# Patient Record
Sex: Female | Born: 1945 | Race: White | Hispanic: No | Smoking: Current every day smoker
Health system: Southern US, Community
[De-identification: ages and names within clinical notes are randomized; demographics above are authoritative.]

## PROBLEM LIST (undated history)

## (undated) DIAGNOSIS — G43909 Migraine, unspecified, not intractable, without status migrainosus: Secondary | ICD-10-CM

## (undated) DIAGNOSIS — R413 Other amnesia: Secondary | ICD-10-CM

## (undated) DIAGNOSIS — M5030 Other cervical disc degeneration, unspecified cervical region: Secondary | ICD-10-CM

## (undated) DIAGNOSIS — F323 Major depressive disorder, single episode, severe with psychotic features: Secondary | ICD-10-CM

## (undated) DIAGNOSIS — M5412 Radiculopathy, cervical region: Secondary | ICD-10-CM

## (undated) DIAGNOSIS — D649 Anemia, unspecified: Secondary | ICD-10-CM

## (undated) DIAGNOSIS — K219 Gastro-esophageal reflux disease without esophagitis: Secondary | ICD-10-CM

## (undated) DIAGNOSIS — M545 Low back pain, unspecified: Secondary | ICD-10-CM

## (undated) DIAGNOSIS — F3289 Other specified depressive episodes: Secondary | ICD-10-CM

## (undated) DIAGNOSIS — M5020 Other cervical disc displacement, unspecified cervical region: Secondary | ICD-10-CM

## (undated) DIAGNOSIS — T8859XA Other complications of anesthesia, initial encounter: Secondary | ICD-10-CM

## (undated) DIAGNOSIS — R634 Abnormal weight loss: Secondary | ICD-10-CM

## (undated) DIAGNOSIS — I10 Essential (primary) hypertension: Secondary | ICD-10-CM

## (undated) DIAGNOSIS — F411 Generalized anxiety disorder: Secondary | ICD-10-CM

## (undated) DIAGNOSIS — R002 Palpitations: Secondary | ICD-10-CM

## (undated) DIAGNOSIS — R42 Dizziness and giddiness: Secondary | ICD-10-CM

## (undated) DIAGNOSIS — E785 Hyperlipidemia, unspecified: Secondary | ICD-10-CM

## (undated) DIAGNOSIS — G4700 Insomnia, unspecified: Secondary | ICD-10-CM

## (undated) DIAGNOSIS — Z8719 Personal history of other diseases of the digestive system: Secondary | ICD-10-CM

## (undated) DIAGNOSIS — F32A Depression, unspecified: Secondary | ICD-10-CM

## (undated) DIAGNOSIS — F329 Major depressive disorder, single episode, unspecified: Secondary | ICD-10-CM

## (undated) DIAGNOSIS — F1021 Alcohol dependence, in remission: Secondary | ICD-10-CM

## (undated) HISTORY — DX: Essential (primary) hypertension: I10

## (undated) HISTORY — DX: Other specified depressive episodes: F32.89

## (undated) HISTORY — DX: Low back pain: M54.5

## (undated) HISTORY — PX: CERVICAL FUSION: SHX112

## (undated) HISTORY — DX: Personal history of other diseases of the digestive system: Z87.19

## (undated) HISTORY — DX: Palpitations: R00.2

## (undated) HISTORY — DX: Generalized anxiety disorder: F41.1

## (undated) HISTORY — DX: Anemia, unspecified: D64.9

## (undated) HISTORY — PX: DILATION AND CURETTAGE OF UTERUS: SHX78

## (undated) HISTORY — DX: Other cervical disc degeneration, unspecified cervical region: M50.30

## (undated) HISTORY — PX: TONSILLECTOMY: SUR1361

## (undated) HISTORY — PX: LUMBAR FUSION: SHX111

## (undated) HISTORY — DX: Alcohol dependence, in remission: F10.21

## (undated) HISTORY — DX: Abnormal weight loss: R63.4

## (undated) HISTORY — DX: Radiculopathy, cervical region: M54.12

## (undated) HISTORY — DX: Major depressive disorder, single episode, unspecified: F32.9

## (undated) HISTORY — DX: Low back pain, unspecified: M54.50

## (undated) HISTORY — DX: Insomnia, unspecified: G47.00

## (undated) HISTORY — DX: Major depressive disorder, single episode, severe with psychotic features: F32.3

## (undated) HISTORY — DX: Depression, unspecified: F32.A

## (undated) HISTORY — DX: Dizziness and giddiness: R42

## (undated) HISTORY — DX: Other cervical disc displacement, unspecified cervical region: M50.20

## (undated) HISTORY — PX: EYE SURGERY: SHX253

## (undated) HISTORY — DX: Other amnesia: R41.3

## (undated) HISTORY — DX: Hyperlipidemia, unspecified: E78.5

## (undated) HISTORY — DX: Migraine, unspecified, not intractable, without status migrainosus: G43.909

## (undated) HISTORY — DX: Gastro-esophageal reflux disease without esophagitis: K21.9

---

## 1997-08-16 ENCOUNTER — Other Ambulatory Visit: Admission: RE | Admit: 1997-08-16 | Discharge: 1997-08-16 | Payer: Self-pay | Admitting: Internal Medicine

## 1998-05-05 ENCOUNTER — Ambulatory Visit (HOSPITAL_COMMUNITY): Admission: RE | Admit: 1998-05-05 | Discharge: 1998-05-05 | Payer: Self-pay | Admitting: Internal Medicine

## 1998-05-05 ENCOUNTER — Encounter: Payer: Self-pay | Admitting: Internal Medicine

## 1998-12-13 ENCOUNTER — Other Ambulatory Visit: Admission: RE | Admit: 1998-12-13 | Discharge: 1998-12-13 | Payer: Self-pay | Admitting: *Deleted

## 1998-12-13 ENCOUNTER — Encounter (INDEPENDENT_AMBULATORY_CARE_PROVIDER_SITE_OTHER): Payer: Self-pay | Admitting: Specialist

## 2000-10-05 ENCOUNTER — Encounter: Payer: Self-pay | Admitting: Internal Medicine

## 2000-10-05 ENCOUNTER — Ambulatory Visit (HOSPITAL_COMMUNITY): Admission: RE | Admit: 2000-10-05 | Discharge: 2000-10-05 | Payer: Self-pay | Admitting: Internal Medicine

## 2001-05-08 ENCOUNTER — Encounter: Payer: Self-pay | Admitting: Internal Medicine

## 2001-05-08 ENCOUNTER — Ambulatory Visit (HOSPITAL_COMMUNITY): Admission: RE | Admit: 2001-05-08 | Discharge: 2001-05-08 | Payer: Self-pay | Admitting: Internal Medicine

## 2001-05-20 ENCOUNTER — Other Ambulatory Visit: Admission: RE | Admit: 2001-05-20 | Discharge: 2001-05-20 | Payer: Self-pay | Admitting: *Deleted

## 2001-07-16 ENCOUNTER — Encounter: Admission: RE | Admit: 2001-07-16 | Discharge: 2001-07-16 | Payer: Self-pay | Admitting: Orthopedic Surgery

## 2001-07-16 ENCOUNTER — Encounter: Payer: Self-pay | Admitting: Orthopedic Surgery

## 2001-08-20 ENCOUNTER — Encounter: Admission: RE | Admit: 2001-08-20 | Discharge: 2001-08-20 | Payer: Self-pay | Admitting: Orthopaedic Surgery

## 2001-08-20 ENCOUNTER — Encounter: Payer: Self-pay | Admitting: Orthopaedic Surgery

## 2001-09-18 ENCOUNTER — Encounter: Payer: Self-pay | Admitting: Orthopedic Surgery

## 2001-09-24 ENCOUNTER — Ambulatory Visit: Admission: RE | Admit: 2001-09-24 | Discharge: 2001-09-24 | Payer: Self-pay | Admitting: Orthopaedic Surgery

## 2001-10-07 ENCOUNTER — Inpatient Hospital Stay (HOSPITAL_COMMUNITY): Admission: RE | Admit: 2001-10-07 | Discharge: 2001-10-13 | Payer: Self-pay | Admitting: Orthopaedic Surgery

## 2001-10-07 ENCOUNTER — Encounter: Payer: Self-pay | Admitting: Orthopaedic Surgery

## 2001-10-12 ENCOUNTER — Encounter: Payer: Self-pay | Admitting: Orthopaedic Surgery

## 2002-09-24 ENCOUNTER — Inpatient Hospital Stay (HOSPITAL_COMMUNITY): Admission: EM | Admit: 2002-09-24 | Discharge: 2002-09-29 | Payer: Self-pay | Admitting: Emergency Medicine

## 2002-09-24 ENCOUNTER — Encounter: Payer: Self-pay | Admitting: Emergency Medicine

## 2002-09-25 ENCOUNTER — Encounter: Payer: Self-pay | Admitting: Internal Medicine

## 2004-01-10 ENCOUNTER — Ambulatory Visit: Payer: Self-pay | Admitting: Internal Medicine

## 2004-01-27 ENCOUNTER — Ambulatory Visit: Payer: Self-pay | Admitting: Internal Medicine

## 2004-03-07 ENCOUNTER — Ambulatory Visit: Payer: Self-pay | Admitting: Internal Medicine

## 2004-07-12 ENCOUNTER — Ambulatory Visit (HOSPITAL_COMMUNITY): Admission: RE | Admit: 2004-07-12 | Discharge: 2004-07-12 | Payer: Self-pay | Admitting: Internal Medicine

## 2004-07-12 ENCOUNTER — Ambulatory Visit: Payer: Self-pay | Admitting: Internal Medicine

## 2004-09-06 ENCOUNTER — Ambulatory Visit: Payer: Self-pay | Admitting: Internal Medicine

## 2004-09-13 ENCOUNTER — Ambulatory Visit: Payer: Self-pay | Admitting: Internal Medicine

## 2004-11-29 ENCOUNTER — Ambulatory Visit (HOSPITAL_COMMUNITY): Admission: RE | Admit: 2004-11-29 | Discharge: 2004-11-30 | Payer: Self-pay | Admitting: Orthopaedic Surgery

## 2005-01-09 ENCOUNTER — Ambulatory Visit: Payer: Self-pay | Admitting: Internal Medicine

## 2005-01-16 ENCOUNTER — Ambulatory Visit: Payer: Self-pay | Admitting: Internal Medicine

## 2005-03-08 ENCOUNTER — Ambulatory Visit: Payer: Self-pay | Admitting: Internal Medicine

## 2005-04-24 ENCOUNTER — Ambulatory Visit: Payer: Self-pay | Admitting: Internal Medicine

## 2005-05-08 ENCOUNTER — Ambulatory Visit: Payer: Self-pay | Admitting: Internal Medicine

## 2005-05-08 ENCOUNTER — Encounter: Admission: RE | Admit: 2005-05-08 | Discharge: 2005-05-08 | Payer: Self-pay | Admitting: Internal Medicine

## 2005-05-10 ENCOUNTER — Ambulatory Visit (HOSPITAL_COMMUNITY): Admission: RE | Admit: 2005-05-10 | Discharge: 2005-05-10 | Payer: Self-pay | Admitting: Internal Medicine

## 2005-05-19 ENCOUNTER — Encounter: Admission: RE | Admit: 2005-05-19 | Discharge: 2005-05-19 | Payer: Self-pay | Admitting: Orthopaedic Surgery

## 2005-06-15 ENCOUNTER — Ambulatory Visit: Payer: Self-pay | Admitting: Internal Medicine

## 2005-06-15 ENCOUNTER — Inpatient Hospital Stay (HOSPITAL_COMMUNITY): Admission: EM | Admit: 2005-06-15 | Discharge: 2005-06-19 | Payer: Self-pay | Admitting: Emergency Medicine

## 2005-06-19 ENCOUNTER — Ambulatory Visit: Payer: Self-pay | Admitting: Internal Medicine

## 2005-07-18 ENCOUNTER — Ambulatory Visit: Payer: Self-pay | Admitting: Internal Medicine

## 2005-11-15 ENCOUNTER — Encounter: Admission: RE | Admit: 2005-11-15 | Discharge: 2005-11-15 | Payer: Self-pay | Admitting: Orthopaedic Surgery

## 2005-11-15 ENCOUNTER — Encounter: Payer: Self-pay | Admitting: Internal Medicine

## 2006-02-26 HISTORY — PX: BACK SURGERY: SHX140

## 2006-03-25 ENCOUNTER — Encounter: Admission: RE | Admit: 2006-03-25 | Discharge: 2006-03-25 | Payer: Self-pay | Admitting: Orthopaedic Surgery

## 2006-03-25 ENCOUNTER — Encounter: Payer: Self-pay | Admitting: Internal Medicine

## 2006-07-09 ENCOUNTER — Ambulatory Visit: Payer: Self-pay | Admitting: Internal Medicine

## 2006-09-19 DIAGNOSIS — F411 Generalized anxiety disorder: Secondary | ICD-10-CM | POA: Insufficient documentation

## 2006-09-19 DIAGNOSIS — K219 Gastro-esophageal reflux disease without esophagitis: Secondary | ICD-10-CM | POA: Insufficient documentation

## 2006-10-22 ENCOUNTER — Ambulatory Visit: Payer: Self-pay | Admitting: Internal Medicine

## 2006-10-25 DIAGNOSIS — G4700 Insomnia, unspecified: Secondary | ICD-10-CM

## 2006-10-25 DIAGNOSIS — Z8719 Personal history of other diseases of the digestive system: Secondary | ICD-10-CM

## 2006-10-25 DIAGNOSIS — E785 Hyperlipidemia, unspecified: Secondary | ICD-10-CM | POA: Insufficient documentation

## 2006-10-25 DIAGNOSIS — F1021 Alcohol dependence, in remission: Secondary | ICD-10-CM

## 2006-10-25 DIAGNOSIS — G43909 Migraine, unspecified, not intractable, without status migrainosus: Secondary | ICD-10-CM | POA: Insufficient documentation

## 2006-11-23 ENCOUNTER — Inpatient Hospital Stay (HOSPITAL_COMMUNITY): Admission: EM | Admit: 2006-11-23 | Discharge: 2006-11-28 | Payer: Self-pay | Admitting: Emergency Medicine

## 2006-11-26 ENCOUNTER — Ambulatory Visit: Payer: Self-pay | Admitting: Internal Medicine

## 2007-01-14 ENCOUNTER — Ambulatory Visit: Payer: Self-pay | Admitting: Internal Medicine

## 2007-01-14 DIAGNOSIS — D649 Anemia, unspecified: Secondary | ICD-10-CM | POA: Insufficient documentation

## 2007-01-14 DIAGNOSIS — M545 Low back pain: Secondary | ICD-10-CM

## 2007-01-14 DIAGNOSIS — M5030 Other cervical disc degeneration, unspecified cervical region: Secondary | ICD-10-CM

## 2007-01-14 DIAGNOSIS — I10 Essential (primary) hypertension: Secondary | ICD-10-CM | POA: Insufficient documentation

## 2007-01-14 LAB — CONVERTED CEMR LAB
Albumin: 4.5 g/dL (ref 3.5–5.2)
Alkaline Phosphatase: 78 units/L (ref 39–117)
Basophils Absolute: 0.1 10*3/uL (ref 0.0–0.1)
Basophils Relative: 0.8 % (ref 0.0–1.0)
Calcium: 10 mg/dL (ref 8.4–10.5)
Chloride: 106 meq/L (ref 96–112)
Cholesterol: 222 mg/dL (ref 0–200)
Eosinophils Absolute: 0.1 10*3/uL (ref 0.0–0.6)
GFR calc Af Amer: 109 mL/min
GFR calc non Af Amer: 90 mL/min
HDL: 41.6 mg/dL (ref >39.0)
Hemoglobin, Urine: NEGATIVE
Ketones, ur: NEGATIVE mg/dL
Lymphocytes Relative: 40.1 % (ref 12.0–46.0)
Monocytes Absolute: 0.4 10*3/uL (ref 0.2–0.7)
Neutro Abs: 4.4 10*3/uL (ref 1.4–7.7)
Potassium: 4.6 meq/L (ref 3.5–5.1)
RDW: 13 % (ref 11.5–14.6)
Sodium: 139 meq/L (ref 135–145)
Specific Gravity, Urine: <=1.005 (ref 1.000–1.03)
Total Bilirubin: 0.5 mg/dL (ref 0.3–1.2)
Triglycerides: 318 mg/dL (ref 0–149)
Urine Glucose: NEGATIVE mg/dL
Urobilinogen, UA: 0.2 (ref 0.0–1.0)
WBC: 8.3 10*3/uL (ref 4.5–10.5)
pH: 5.5 (ref 5.0–8.0)

## 2007-02-11 ENCOUNTER — Telehealth (INDEPENDENT_AMBULATORY_CARE_PROVIDER_SITE_OTHER): Payer: Self-pay | Admitting: *Deleted

## 2007-04-04 ENCOUNTER — Encounter: Payer: Self-pay | Admitting: Internal Medicine

## 2007-05-16 ENCOUNTER — Ambulatory Visit: Payer: Self-pay | Admitting: Internal Medicine

## 2007-06-25 ENCOUNTER — Telehealth (INDEPENDENT_AMBULATORY_CARE_PROVIDER_SITE_OTHER): Payer: Self-pay | Admitting: *Deleted

## 2007-06-27 ENCOUNTER — Telehealth (INDEPENDENT_AMBULATORY_CARE_PROVIDER_SITE_OTHER): Payer: Self-pay | Admitting: *Deleted

## 2007-06-28 ENCOUNTER — Telehealth: Payer: Self-pay | Admitting: Internal Medicine

## 2007-07-05 ENCOUNTER — Emergency Department (HOSPITAL_COMMUNITY): Admission: EM | Admit: 2007-07-05 | Discharge: 2007-07-05 | Payer: Self-pay | Admitting: Emergency Medicine

## 2007-07-12 ENCOUNTER — Emergency Department (HOSPITAL_COMMUNITY): Admission: EM | Admit: 2007-07-12 | Discharge: 2007-07-13 | Payer: Self-pay | Admitting: Emergency Medicine

## 2007-07-16 ENCOUNTER — Ambulatory Visit: Payer: Self-pay | Admitting: Internal Medicine

## 2007-07-16 DIAGNOSIS — F329 Major depressive disorder, single episode, unspecified: Secondary | ICD-10-CM

## 2007-08-01 ENCOUNTER — Telehealth (INDEPENDENT_AMBULATORY_CARE_PROVIDER_SITE_OTHER): Payer: Self-pay | Admitting: *Deleted

## 2007-08-26 ENCOUNTER — Telehealth (INDEPENDENT_AMBULATORY_CARE_PROVIDER_SITE_OTHER): Payer: Self-pay | Admitting: *Deleted

## 2007-09-11 ENCOUNTER — Encounter: Payer: Self-pay | Admitting: Internal Medicine

## 2007-09-24 ENCOUNTER — Emergency Department (HOSPITAL_COMMUNITY): Admission: EM | Admit: 2007-09-24 | Discharge: 2007-09-24 | Payer: Self-pay | Admitting: Emergency Medicine

## 2007-10-21 ENCOUNTER — Telehealth: Payer: Self-pay | Admitting: Internal Medicine

## 2007-11-04 ENCOUNTER — Telehealth (INDEPENDENT_AMBULATORY_CARE_PROVIDER_SITE_OTHER): Payer: Self-pay | Admitting: *Deleted

## 2007-12-03 ENCOUNTER — Telehealth: Payer: Self-pay | Admitting: Internal Medicine

## 2007-12-16 ENCOUNTER — Telehealth: Payer: Self-pay | Admitting: Internal Medicine

## 2007-12-16 ENCOUNTER — Emergency Department (HOSPITAL_COMMUNITY): Admission: EM | Admit: 2007-12-16 | Discharge: 2007-12-16 | Payer: Self-pay | Admitting: Emergency Medicine

## 2007-12-18 ENCOUNTER — Telehealth (INDEPENDENT_AMBULATORY_CARE_PROVIDER_SITE_OTHER): Payer: Self-pay | Admitting: *Deleted

## 2007-12-19 ENCOUNTER — Ambulatory Visit: Payer: Self-pay | Admitting: Internal Medicine

## 2007-12-24 ENCOUNTER — Encounter: Payer: Self-pay | Admitting: Internal Medicine

## 2008-01-09 ENCOUNTER — Ambulatory Visit: Payer: Self-pay | Admitting: Internal Medicine

## 2008-01-09 DIAGNOSIS — R413 Other amnesia: Secondary | ICD-10-CM | POA: Insufficient documentation

## 2008-01-23 ENCOUNTER — Telehealth (INDEPENDENT_AMBULATORY_CARE_PROVIDER_SITE_OTHER): Payer: Self-pay | Admitting: *Deleted

## 2008-02-26 ENCOUNTER — Telehealth: Payer: Self-pay | Admitting: Internal Medicine

## 2008-07-21 ENCOUNTER — Ambulatory Visit: Payer: Self-pay | Admitting: Internal Medicine

## 2008-07-21 DIAGNOSIS — M5020 Other cervical disc displacement, unspecified cervical region: Secondary | ICD-10-CM | POA: Insufficient documentation

## 2008-07-22 LAB — CONVERTED CEMR LAB
ALT: 10 units/L (ref 0–35)
AST: 18 units/L (ref 0–37)
Albumin: 4.3 g/dL (ref 3.5–5.2)
BUN: 13 mg/dL (ref 6–23)
Basophils Absolute: 0.1 10*3/uL (ref 0.0–0.1)
Bilirubin, Direct: 0.1 mg/dL (ref 0.0–0.3)
Chloride: 106 meq/L (ref 96–112)
Cholesterol: 316 mg/dL — ABNORMAL HIGH (ref 0–200)
Direct LDL: 225 mg/dL
Eosinophils Absolute: 0.1 10*3/uL (ref 0.0–0.7)
Eosinophils Relative: 0.7 % (ref 0.0–5.0)
Hemoglobin, Urine: NEGATIVE
Hemoglobin: 13 g/dL (ref 12.0–15.0)
Lymphocytes Relative: 33.6 % (ref 12.0–46.0)
MCV: 101.7 fL — ABNORMAL HIGH (ref 78.0–100.0)
Monocytes Absolute: 0.3 10*3/uL (ref 0.1–1.0)
Monocytes Relative: 3.1 % (ref 3.0–12.0)
Potassium: 4 meq/L (ref 3.5–5.1)
RBC: 3.64 M/uL — ABNORMAL LOW (ref 3.87–5.11)
Sodium: 138 meq/L (ref 135–145)
Specific Gravity, Urine: <=1.005 (ref 1.000–1.030)
Total Bilirubin: 0.7 mg/dL (ref 0.3–1.2)
Total Protein, Urine: NEGATIVE mg/dL
Triglycerides: 290 mg/dL — ABNORMAL HIGH (ref 0.0–149.0)
WBC: 10.1 10*3/uL (ref 4.5–10.5)
pH: 5.5 (ref 5.0–8.0)

## 2008-07-27 LAB — CONVERTED CEMR LAB: Vit D, 25-Hydroxy: 7 ng/mL — ABNORMAL LOW (ref 30–89)

## 2008-09-20 ENCOUNTER — Telehealth (INDEPENDENT_AMBULATORY_CARE_PROVIDER_SITE_OTHER): Payer: Self-pay | Admitting: *Deleted

## 2008-09-29 ENCOUNTER — Ambulatory Visit: Payer: Self-pay | Admitting: Internal Medicine

## 2008-09-29 DIAGNOSIS — R42 Dizziness and giddiness: Secondary | ICD-10-CM

## 2008-09-29 LAB — CONVERTED CEMR LAB
Bilirubin Urine: NEGATIVE
CO2: 30 meq/L (ref 19–32)
Calcium: 10.3 mg/dL (ref 8.4–10.5)
Eosinophils Relative: 0.7 % (ref 0.0–5.0)
Glucose, Bld: 100 mg/dL — ABNORMAL HIGH (ref 70–99)
HCT: 38.5 % (ref 36.0–46.0)
Hemoglobin, Urine: NEGATIVE
Lymphocytes Relative: 34.7 % (ref 12.0–46.0)
Lymphs Abs: 2.8 10*3/uL (ref 0.7–4.0)
MCHC: 34.7 g/dL (ref 30.0–36.0)
Nitrite: NEGATIVE
RBC: 3.82 M/uL — ABNORMAL LOW (ref 3.87–5.11)
RDW: 12.4 % (ref 11.5–14.6)
Sodium: 139 meq/L (ref 135–145)
Urine Glucose: NEGATIVE mg/dL
Urobilinogen, UA: 0.2 (ref 0.0–1.0)

## 2008-10-12 ENCOUNTER — Ambulatory Visit: Payer: Self-pay | Admitting: Internal Medicine

## 2008-10-12 DIAGNOSIS — M5412 Radiculopathy, cervical region: Secondary | ICD-10-CM | POA: Insufficient documentation

## 2008-10-12 DIAGNOSIS — F323 Major depressive disorder, single episode, severe with psychotic features: Secondary | ICD-10-CM

## 2008-12-13 ENCOUNTER — Telehealth: Payer: Self-pay | Admitting: Internal Medicine

## 2008-12-14 ENCOUNTER — Telehealth: Payer: Self-pay | Admitting: Internal Medicine

## 2008-12-15 ENCOUNTER — Telehealth: Payer: Self-pay | Admitting: Internal Medicine

## 2008-12-20 ENCOUNTER — Telehealth: Payer: Self-pay | Admitting: Internal Medicine

## 2008-12-27 ENCOUNTER — Encounter: Payer: Self-pay | Admitting: Internal Medicine

## 2009-01-07 ENCOUNTER — Telehealth: Payer: Self-pay | Admitting: Endocrinology

## 2009-01-08 ENCOUNTER — Emergency Department (HOSPITAL_COMMUNITY): Admission: EM | Admit: 2009-01-08 | Discharge: 2009-01-08 | Payer: Self-pay | Admitting: Emergency Medicine

## 2009-01-18 ENCOUNTER — Ambulatory Visit: Payer: Self-pay | Admitting: Internal Medicine

## 2009-02-07 ENCOUNTER — Telehealth: Payer: Self-pay | Admitting: Internal Medicine

## 2009-03-09 ENCOUNTER — Encounter (INDEPENDENT_AMBULATORY_CARE_PROVIDER_SITE_OTHER): Payer: Self-pay | Admitting: *Deleted

## 2009-04-08 ENCOUNTER — Telehealth: Payer: Self-pay | Admitting: Internal Medicine

## 2009-04-14 ENCOUNTER — Encounter: Payer: Self-pay | Admitting: Internal Medicine

## 2009-06-03 ENCOUNTER — Encounter: Payer: Self-pay | Admitting: Internal Medicine

## 2009-06-03 ENCOUNTER — Encounter: Admission: RE | Admit: 2009-06-03 | Discharge: 2009-06-03 | Payer: Self-pay | Admitting: Orthopaedic Surgery

## 2009-06-09 ENCOUNTER — Ambulatory Visit: Payer: Self-pay | Admitting: Internal Medicine

## 2009-06-09 DIAGNOSIS — R634 Abnormal weight loss: Secondary | ICD-10-CM

## 2009-06-10 LAB — CONVERTED CEMR LAB
BUN: 12 mg/dL (ref 6–23)
Basophils Relative: 0.6 % (ref 0.0–3.0)
Bilirubin, Direct: 0.1 mg/dL (ref 0.0–0.3)
CO2: 26 meq/L (ref 19–32)
Cholesterol: 310 mg/dL — ABNORMAL HIGH (ref 0–200)
Eosinophils Relative: 0.4 % (ref 0.0–5.0)
HDL: 72.5 mg/dL (ref >39.00)
Ketones, ur: NEGATIVE mg/dL
Lymphocytes Relative: 41.2 % (ref 12.0–46.0)
Lymphs Abs: 5.2 10*3/uL — ABNORMAL HIGH (ref 0.7–4.0)
MCHC: 34.9 g/dL (ref 30.0–36.0)
Neutro Abs: 6.8 10*3/uL (ref 1.4–7.7)
Neutrophils Relative %: 53.7 % (ref 43.0–77.0)
Nitrite: NEGATIVE
Platelets: 306 10*3/uL (ref 150.0–400.0)
RBC: 4.35 M/uL (ref 3.87–5.11)
RDW: 17 % — ABNORMAL HIGH (ref 11.5–14.6)
Sodium: 138 meq/L (ref 135–145)
Specific Gravity, Urine: >=1.03 (ref 1.000–1.030)
TSH: 2.56 microintl units/mL (ref 0.35–5.50)
Total CHOL/HDL Ratio: 4
Total Protein: 8.5 g/dL — ABNORMAL HIGH (ref 6.0–8.3)
Urine Glucose: NEGATIVE mg/dL
pH: 5 (ref 5.0–8.0)

## 2009-07-14 ENCOUNTER — Telehealth: Payer: Self-pay | Admitting: Internal Medicine

## 2009-07-21 ENCOUNTER — Inpatient Hospital Stay (HOSPITAL_COMMUNITY): Admission: EM | Admit: 2009-07-21 | Discharge: 2009-07-29 | Payer: Self-pay | Admitting: Emergency Medicine

## 2009-07-21 ENCOUNTER — Ambulatory Visit: Payer: Self-pay | Admitting: Emergency Medicine

## 2009-07-22 ENCOUNTER — Ambulatory Visit: Payer: Self-pay | Admitting: Internal Medicine

## 2009-07-29 ENCOUNTER — Inpatient Hospital Stay (HOSPITAL_COMMUNITY): Admission: AD | Admit: 2009-07-29 | Discharge: 2009-08-08 | Payer: Self-pay | Admitting: Psychiatry

## 2009-07-29 ENCOUNTER — Ambulatory Visit: Payer: Self-pay | Admitting: Psychiatry

## 2009-08-02 ENCOUNTER — Telehealth: Payer: Self-pay | Admitting: Endocrinology

## 2009-08-12 ENCOUNTER — Ambulatory Visit: Payer: Self-pay | Admitting: Psychiatry

## 2009-08-15 ENCOUNTER — Ambulatory Visit: Payer: Self-pay | Admitting: Internal Medicine

## 2009-08-16 ENCOUNTER — Encounter: Payer: Self-pay | Admitting: Internal Medicine

## 2009-09-03 ENCOUNTER — Emergency Department (HOSPITAL_COMMUNITY): Admission: EM | Admit: 2009-09-03 | Discharge: 2009-09-04 | Payer: Self-pay | Admitting: Internal Medicine

## 2009-09-03 ENCOUNTER — Ambulatory Visit: Payer: Self-pay | Admitting: Psychiatry

## 2009-09-04 ENCOUNTER — Inpatient Hospital Stay (HOSPITAL_COMMUNITY): Admission: AD | Admit: 2009-09-04 | Discharge: 2009-09-16 | Payer: Self-pay | Admitting: Psychiatry

## 2009-09-06 ENCOUNTER — Ambulatory Visit: Payer: Self-pay | Admitting: Internal Medicine

## 2009-10-05 ENCOUNTER — Encounter: Payer: Self-pay | Admitting: Internal Medicine

## 2009-11-10 ENCOUNTER — Ambulatory Visit: Payer: Self-pay | Admitting: Internal Medicine

## 2009-11-11 LAB — CONVERTED CEMR LAB
HDL: 54 mg/dL (ref >39.00)
Total CHOL/HDL Ratio: 5
Triglycerides: 372 mg/dL — ABNORMAL HIGH (ref 0.0–149.0)

## 2009-12-09 ENCOUNTER — Ambulatory Visit: Payer: Self-pay | Admitting: Internal Medicine

## 2010-03-19 ENCOUNTER — Encounter: Payer: Self-pay | Admitting: Orthopaedic Surgery

## 2010-03-19 ENCOUNTER — Encounter: Payer: Self-pay | Admitting: Internal Medicine

## 2010-03-20 ENCOUNTER — Encounter: Payer: Self-pay | Admitting: Internal Medicine

## 2010-03-28 NOTE — Progress Notes (Signed)
Summary: Behavoral Health/[facility] pt  Phone Note From Other Clinic   Caller: [facility] Mgr @ Behavioral Health [phone] Summary of Call: [name] called to inform MD, per pt request, that pt has been admitted to [facility] on 06/03 for Suicidal Ideations. Pt will likely remain a there for at least another week. Please forward to [facility]* Initial call taken by: Margaret Pyle, CMA,  August 02, 2009 1:45 PM  Follow-up for Phone Call        noted, thank you Follow-up by: Minus Breeding MD,  August 02, 2009 1:56 PM

## 2010-03-28 NOTE — Consult Note (Signed)
Summary: [facility]  [facility]   Imported By: Sherian Rein 10/12/2009 15:06:59  _____________________________________________________________________  External Attachment:    Type:   Image     Comment:   External Document

## 2010-03-28 NOTE — Assessment & Plan Note (Signed)
Summary: POST HOSP/NWS   Vital Signs:  Patient profile:   65 year old female Height:      66 inches Weight:      145.75 pounds BMI:     23.61 O2 Sat:      98 % on Room air Temp:     97.3 degrees F oral Pulse rate:   100 / minute BP sitting:   144 / 84  (left arm) Cuff size:   regular  Vitals Entered ByZella Ball Ewing (August 15, 2009 4:10 PM)  O2 Flow:  Room air CC: Followup from Behavioral Health/RE   CC:  Followup from Behavioral Health/RE.  History of Present Illness: here after recent polysubstance OD attempt , including the elavil, and norco.  Now 7 days post d/c with new tx with prozac adn gabapentin;  needs new presciption for other chronic meds.  Now living with son and daughterinlaw in [place], and daughter here and states she is actively daily helping with med admin.  Pt denies CP, sob, doe, wheezing, orthopnea, pnd, worsening LE edema, palps, dizziness or syncope   Pt denies new neuro symptoms such as headache, facial or extremity weakness , except for 2 months increased LUE numbness wihtout pain and weakness but constant and increased for approx 2 mo.  Overall good compliance with meds,  good tolerability.  Out of BP and statin - requests refills.  Also with increased memory loss for over a year  - w/u 2010 neg, has not had MRI,  and now willing to have MRI and neuro eval.   Does not have specific counseling or psychiatry f/u post psych hospn tx.  Depressive symptoms persist but not suicidal or ideation.  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) 8)  Memory Loss  (ICD-780.93) 9)  Depression  (ICD-311) 10)  Preoperative Examination  (ICD-V72.84) 11)  Depressive Disorder   (ICD-311) 12)  Degenerative Disc Disease, Cervical Spine  (ICD-722.4) 13)  Anemia-nos  (ICD-285.9) 14)  Low Back Pain  (ICD-724.2) 15)  Preventive Health Care  (ICD-V70.0) 16)  Hypertension  (ICD-401.9) 17)  Family History Depression  (ICD-V17.0) 18)  Family History of Alcoholism/addiction  (ICD-V61.41) 19)  Alcohol Abuse, Hx of  (ICD-V11.3) 20)  Insomnia  (ICD-780.52) 21)  Migraine Headache  (ICD-346.90) 22)  Hyperlipidemia  (ICD-272.4) 23)  Diverticulitis, Hx of  (ICD-V12.79) 24)  Gerd  (ICD-530.81) 25)  Anxiety  (ICD-300.00)  Medications Prior to Update: 1)  Fluoxetine Hcl 20 Mg  Tabs (Fluoxetine Hcl) .... 3 By Mouth Once Daily 2)  Hydrocodone-Acetaminophen 10-325 Mg Tabs (Hydrocodone-Acetaminophen) .[name].. 1 By Mouth Q 6 Hrs As Needed - To Fill Jul 15, 2009 3)  Alprazolam 0.5 Mg  Tabs (Alprazolam) .[name].. 1 By Mouth Three Times A Day As Needed - To Fill Jun 09, 2009 4)  Lisinopril 10 Mg  Tabs (Lisinopril) .[name].. 1po Once Daily 5)  Amitriptyline Hcl 150 Mg Tabs (Amitriptyline Hcl) .[name].. 1 By Mouth At Bedtime 6)  Adult Aspirin Ec Low Strength 81 Mg Tbec (Aspirin) .[name].. 1 By Mouth Once Daily 7)  Budeprion Xl 150 Mg Xr24h-Tab (Bupropion Hcl) .[name].. 1 By Mouth Once Daily 8)  Omeprazole 20 Mg Tbec (Omeprazole) .[name].. 1 By Mouth Once Daily 9)  Simvastatin 40 Mg Tabs (Simvastatin) .[name].. 1po Once Daily  Current Medications (verified): 1)  Fluoxetine Hcl 20 Mg  Tabs (Fluoxetine Hcl) .... 3 By Mouth Once Daily - Ok To Fill Early 2)  Lisinopril 10 Mg  Tabs (Lisinopril) .[name].. 1po Once Daily 3)  Adult Aspirin Ec Low Strength 81 Mg Tbec (Aspirin) .[name].. 1 By Mouth Once Daily 4)  Simvastatin 40 Mg Tabs (Simvastatin) .[name].. 1po Once Daily 5)  Gabapentin 300 Mg Caps (Gabapentin) .[name].. 1 By Mouth 4 Times A Day and 2 By Mouth At Bedtime  Allergies (verified): 1)  ! * Codiene 2)  ! Morphine 3)  ! 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   Head:  normocephalic and atraumatic.   Eyes:  vision grossly intact, pupils equal, and pupils round.   Ears:  R ear normal and L ear normal.   Nose:  no external deformity and no nasal discharge.   Mouth:  no gingival abnormalities and pharynx pink and moist.   Neck:  supple and no masses.   Lungs:  normal respiratory effort and normal breath sounds.   Heart:  normal rate and regular rhythm.   Abdomen:  soft, non-tender, and normal bowel sounds.   Msk:  no joint tenderness and no joint swelling.  , no spine tender or paravertebral tender or swelling Extremities:  no edema, no erythema  Neurologic:  cranial nerves II-XII intact and strength normal in all extremities.   Psych:  moderately anxious.  , confused about recent events   Impression & Recommendations:  Problem # 1:  MEMORY LOSS (ICD-780.93)  for MRI head, refer neurology  Orders: Neurology Referral (Neuro) Radiology Referral (Radiology)  Problem # 2:  DEGENERATIVE DISC DISEASE, CERVICAL SPINE, W/RADICULOPATHY (ICD-722.0) with persistent LUE numbness, refer back to dr Noel Gerold - symptoms getting worse x 2 mo  Problem # 3:  HYPERTENSION (ICD-401.9)  Her updated medication list for this problem includes:    Lisinopril 10 Mg Tabs (Lisinopril) .[name]... 1po once daily  BP today:  144/84 Prior BP: 144/96 (06/09/2009)  Labs Reviewed: K+: 4.5 (06/09/2009) Creat: : 1.0 (06/09/2009)   Chol: 310 (06/09/2009)   HDL: 72.50 (06/09/2009)   LDL: DEL (01/14/2007)   TG: 343.0 (06/09/2009) uncontrolled;  to re-start meds  Problem # 4:  HYPERLIPIDEMIA (ICD-272.4)  Her updated medication list for this problem includes:    Simvastatin 40 Mg Tabs (Simvastatin) .[name]... 1po once daily  Labs Reviewed: SGOT: 16 (06/09/2009)   SGPT: 12 (06/09/2009)   HDL:72.50 (06/09/2009), 54.70 (07/21/2008)  LDL:DEL (01/14/2007)  Chol:310 (06/09/2009), 316 (07/21/2008)  Trig:343.0 (06/09/2009), 290.0 (07/21/2008) uncontrolled, to re-start meds, Pt to continue diet efforts, good med tolerance;  - goal LDL less than 100  Problem # 5:  DEPRESSION (ICD-311)  The following medications were removed from the medication list:  Alprazolam 0.5 Mg Tabs (Alprazolam) .[name]... 1 by mouth three times a day as needed - to fill Jun 09, 2009    Amitriptyline Hcl 150 Mg Tabs (Amitriptyline hcl) .[name]... 1 by mouth at bedtime    Budeprion Xl 150 Mg Xr24h-tab (Bupropion hcl) .[name]... 1 by mouth once daily Her updated medication list for this problem includes:    Fluoxetine Hcl 20 Mg Tabs (Fluoxetine hcl) .Marland KitchenMarland KitchenMarland KitchenMarland Kitchen 3 by mouth once daily - ok to fill early jsut started SSRI - to f/u 3 wks, no suicidal ideation  Complete Medication List: 1)  Fluoxetine Hcl 20 Mg Tabs (Fluoxetine hcl) .... 3 by mouth once daily - ok to fill early 2)  Lisinopril 10 Mg Tabs (Lisinopril) .[name].. 1po once daily 3)  Adult Aspirin Ec Low Strength 81 Mg Tbec (Aspirin) .[name].. 1 by mouth once daily 4)  Simvastatin 40 Mg Tabs (Simvastatin) .[name].. 1po once daily 5)  Gabapentin 300 Mg Caps (Gabapentin) .[name].. 1 by mouth 4 times a day and 2 by mouth at bedtime  Other Orders: Orthopedic Surgeon Referral (Ortho Surgeon)  Patient Instructions: 1)  You will be contacted about the referral(s) to: Dr Noel Gerold, as well as the MRI head, and Neurology 2)  Please take all new  medications as prescribed 3)  Continue all previous medications as before this visit  4)  Please schedule a follow-up appointment in 3 weeks. Prescriptions: SIMVASTATIN 40 MG TABS (SIMVASTATIN) 1po once daily  #90 x 3   Entered and Authorized by:   Corwin Levins MD   Signed by:   Corwin Levins MD on 08/15/2009   Method used:   Electronically to        [facility] [address] (retail)       [address]       [place], [place]  [address]       Ph: [phone] or [phone]       Fax: [phone]   RxID:   [id number] LISINOPRIL 10 MG  TABS (LISINOPRIL) 1po once daily  #90 x 3   Entered and Authorized by:   Corwin Levins MD   Signed by:   Corwin Levins MD on 08/15/2009   Method used:   Electronically to        [facility] [address] (retail)       [address]       [place], [place]  [address]       Ph: [phone] or [phone]       Fax: [phone]   RxID:   [id number] FLUOXETINE HCL 20 MG  TABS (FLUOXETINE HCL) 3 by mouth once daily - ok to fill early  #90 x 3   Entered and Authorized by:   Corwin Levins MD   Signed by:   Corwin Levins MD on 08/15/2009   Method used:   Electronically to        [facility] [address] (retail)       [address]       [place], [place]  [address]       Ph: [phone] or [phone]       Fax: [phone]   RxID:   [id number]

## 2010-03-28 NOTE — Letter (Signed)
Summary: [facility] Consult Scheduled Letter  [facility]  [address] [place], [place] [address]   Phone: [phone]  Fax: [phone]      03/09/2009 MRN: [id number]  [facility] [address] [place], [place]  [address]    Dear Ms. Verdell,      We have scheduled an appointment for you.  At the recommendation of Dr.John, we have scheduled you a consult with Dr Noel Gerold on 04/12/09 at 2:45pm.  Their phone number is [phone].  If this appointment day and time is not convenient for you, please feel free to call the office of the doctor you are being referred to at the number listed above and reschedule the appointment.     [facility] [address] [place], [place] [address]   Thank you,  Patient Care Coordinator [facility]

## 2010-03-28 NOTE — Progress Notes (Signed)
  Phone Note Refill Request  on Jul 14, 2009 9:19 AM  Refills Requested: Medication #1:  HYDROCODONE-ACETAMINOPHEN 10-325 MG TABS 1 by mouth q 6 hrs as needed - to fill feb 21   Dosage confirmed as above?Dosage Confirmed   Notes: [facility] [phone] Initial call taken by: Scharlene Gloss,  Jul 14, 2009 9:20 AM  Follow-up for Phone Call        Rx faxed to pharmacy Follow-up by: Margaret Pyle, CMA,  Jul 14, 2009 1:39 PM    New/Updated Medications: HYDROCODONE-ACETAMINOPHEN 10-325 MG TABS (HYDROCODONE-ACETAMINOPHEN) 1 by mouth q 6 hrs as needed - to fill Jul 15, 2009 Prescriptions: HYDROCODONE-ACETAMINOPHEN 10-325 MG TABS (HYDROCODONE-ACETAMINOPHEN) 1 by mouth q 6 hrs as needed - to fill Jul 15, 2009  #120 x 2   Entered and Authorized by:   Corwin Levins MD   Signed by:   Corwin Levins MD on 07/14/2009   Method used:   Print then Give to Patient   RxID:   [id number]  done hardcopy to LIM side B - dahlia  Corwin Levins MD  Jul 14, 2009 1:23 PM

## 2010-03-28 NOTE — Assessment & Plan Note (Signed)
Summary: FU--D/T-STC   Vital Signs:  Patient profile:   65 year old female Height:      66 inches Weight:      154.75 pounds BMI:     25.07 O2 Sat:      96 % on Room air Temp:     98.3 degrees F oral Pulse rate:   98 / minute BP sitting:   118 / 78  (left arm) Cuff size:   regular  Vitals Entered By: Zella Ball Ewing CMA Duncan Dull) (November 10, 2009 2:14 PM)  O2 Flow:  Room air CC: followup/RE   CC:  followup/RE.  History of Present Illness: here with daugther very supportive;  pt now being followed by psychiatry in [place] (after a second psych inpt hospn) on new meds and overall doing quite well it seems;  no worsening depressive symptoms, suicidal ideation panic or worsening anxiety.  Has lost a few lbs and overall diet improved since living in [place] with less salt.  Due for flu shot.  Trying to follow lower chol diet;  with recent med changes, as not been taking the lisinopril or simvastatin.  Pt denies CP, worsening sob, doe, wheezing, orthopnea, pnd, worsening LE edema, palps, dizziness or syncope  Pt denies new neuro symptoms such as headache, facial or extremity weakness No fever, night sweats, loss of appetite or other constitutional symptoms  Sleep remains difficult but ok with the ambien.  incidently has been followed by ortho as well with plan for [facility] tomorrow, and if not improved, for repeat MRI cervical.  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) 8)  Memory Loss  (ICD-780.93) 9)  Depression  (ICD-311) 10)  Preoperative Examination  (ICD-V72.84) 11)  Depressive Disorder  (ICD-311) 12)  Degenerative Disc Disease, Cervical Spine  (ICD-722.4) 13)  Anemia-nos  (ICD-285.9) 14)  Low  Back Pain  (ICD-724.2) 15)  Preventive Health Care  (ICD-V70.0) 16)  Hypertension  (ICD-401.9) 17)  Family History Depression  (ICD-V17.0) 18)  Family History of Alcoholism/addiction  (ICD-V61.41) 19)  Alcohol Abuse, Hx of  (ICD-V11.3) 20)  Insomnia  (ICD-780.52) 21)  Migraine Headache  (ICD-346.90) 22)  Hyperlipidemia  (ICD-272.4) 23)  Diverticulitis, Hx of  (ICD-V12.79) 24)  Gerd  (ICD-530.81) 25)  Anxiety  (ICD-300.00)  Medications Prior to Update: 1)  Fluoxetine Hcl 20 Mg  Tabs (Fluoxetine Hcl) .... 3 By Mouth Once Daily - Ok To Fill Early 2)  Lisinopril 10 Mg  Tabs (Lisinopril) .[name].. 1po Once Daily 3)  Adult Aspirin Ec Low Strength 81 Mg Tbec (Aspirin) .[name].. 1 By Mouth Once Daily 4)  Simvastatin 40 Mg Tabs (Simvastatin) .[name].. 1po Once Daily 5)  Gabapentin 300 Mg Caps (Gabapentin) .[name].. 1 By Mouth 4 Times A Day and 2 By Mouth At Bedtime  Current Medications (verified): 1)  Fluoxetine Hcl 20 Mg  Tabs (Fluoxetine Hcl) .... 3 By Mouth Once Daily - Ok To Fill Early 2)  Adult Aspirin Ec Low Strength 81 Mg Tbec (Aspirin) .[name].. 1 By Mouth Once Daily 3)  Zolpidem Tartrate 10 Mg Tabs (Zolpidem Tartrate) .[name].. 1 By Mouth Once Daily 4)  Abilify 5 Mg Tabs (Aripiprazole) .[name].. 1 By Mouth Once Daily 5)  Gabapentin 300 Mg Caps (Gabapentin) .[name].. 1 By Mouth  Three Times A Day  Allergies (verified): 1)  ! * Codiene 2)  ! Morphine 3)  ! 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   Head:  normocephalic and atraumatic.   Eyes:  vision grossly intact, pupils equal, and pupils round.   Ears:  R ear normal and L ear normal.   Nose:  no external deformity and no nasal discharge.   Mouth:  no gingival abnormalities and pharynx pink and moist.   Neck:  supple and no masses.   Lungs:  normal respiratory effort and normal breath sounds.   Heart:  normal rate and regular rhythm.   Abdomen:  soft, non-tender, and normal bowel sounds.   Msk:  no joint tenderness and no joint swelling.   Extremities:  no edema, no erythema  Neurologic:  strength normal in all extremities and gait normal.   Psych:  not depressed appearing and slightly anxious.     Impression & Recommendations:  Problem # 1:  INSOMNIA-SLEEP DISORDER-UNSPEC (ICD-780.52)  Her updated medication list for this problem includes:    Zolpidem Tartrate 10 Mg Tabs (Zolpidem tartrate) .[name]... 1 by mouth once daily stable overall by hx and exam, ok to continue meds/tx as is   Problem # 2:  DEPRESSIVE TYPE PSYCHOSIS (ICD-298.0) stable overall by hx and exam, ok to continue meds/tx as is   Problem # 3:  CERVICAL RADICULOPATHY, LEFT (ICD-723.4) foir ESI planned for tomorrow, and repeat MRi if not helped  Problem # 4:  HYPERTENSION (ICD-401.9)  The following medications were removed from the medication list:    Lisinopril 10 Mg Tabs (Lisinopril) .[name]... 1po once daily with some diet change (? lower salt) and mild wt loss - stable overall by hx and exam, ok to continue meds/tx as is - to stay off the lisinopriol for now  BP today: 118/78 Prior BP: 144/84 (08/15/2009)  Labs Reviewed: K+: 4.5 (06/09/2009) Creat: : 1.0 (06/09/2009)   Chol: 310 (06/09/2009)   HDL: 72.50 (06/09/2009)   LDL: DEL (01/14/2007)   TG: 343.0  (06/09/2009)  Problem # 5:  HYPERLIPIDEMIA (ICD-272.4)  The following medications were removed from the medication list:    Simvastatin 40 Mg Tabs (Simvastatin) .[name]... 1po once daily with diet change in last 6 mo - to re-check lipids, consider re-start statin, Pt to continue diet efforts, good med tolerance prior to stopping,  to check labs - goal LDL less than 100  Orders: TLB-Lipid Panel (80061-[id number])  Labs Reviewed: SGOT: 16 (06/09/2009)   SGPT: 12 (06/09/2009)   HDL:72.50 (06/09/2009), 54.70 (07/21/2008)  LDL:DEL (01/14/2007)  Chol:310 (06/09/2009), 316 (07/21/2008)  Trig:343.0 (06/09/2009), 290.0 (07/21/2008)  Complete Medication List: 1)  Fluoxetine Hcl 20 Mg Tabs (Fluoxetine hcl) .... 3 by mouth once daily - ok to fill early 2)  Adult Aspirin Ec Low Strength 81  Mg Tbec (Aspirin) .[name].. 1 by mouth once daily 3)  Zolpidem Tartrate 10 Mg Tabs (Zolpidem tartrate) .[name].. 1 by mouth once daily 4)  Abilify 5 Mg Tabs (Aripiprazole) .[name].. 1 by mouth once daily 5)  Gabapentin 300 Mg Caps (Gabapentin) .[name].. 1 by mouth three times a day  Other Orders: Flu Vaccine 21yrs + MEDICARE PATIENTS (Z6109) Administration Flu vaccine - MCR (U0454)  Patient Instructions: 1)  you had the flu shot today 2)  please follwoup with your injection tomorrow as planned 3)  you can stay off the lisinopril and the simvastatin for now 4)  Please go to the Lab in the basement for your blood and/or urine tests today  -the cholesterol today 5)  Continue all other previous medications as before this visit  6)  OK to take the Tylenol PM occasinally at night as needed 7)  Please keep your specialist appts as you do 8)  Please schedule a follow-up appointment in April 2012 with CPX labs 9)  Check your Blood Pressure regularly. If it is above 140/90 on a regular basis: you should make an appointment.   Flu Vaccine Consent Questions     Do you have a history of severe allergic reactions to this vaccine? no    Any prior  history of allergic reactions to egg and/or gelatin? no    Do you have a sensitivity to the preservative Thimersol? no    Do you have a past history of Guillan-Barre Syndrome? no    Do you currently have an acute febrile illness? no    Have you ever had a severe reaction to latex? no    Vaccine information given and explained to patient? yes    Are you currently pregnant? no    Lot Number:[id number]   Exp Date:08/26/2010   Site Given  Left Deltoid IMflu

## 2010-03-28 NOTE — Assessment & Plan Note (Signed)
Summary: NECK PROBLEM/ DISCUSS MEDS/NWS  #   Vital Signs:  Patient profile:   65 year old female Height:      64.5 inches Weight:      151.25 pounds BMI:     25.65 O2 Sat:      98 % on Room air Temp:     97.8 degrees F oral Pulse rate:   105 / minute BP sitting:   144 / 96  (left arm) Cuff size:   regular  Vitals Entered ByZella Ball Ewing (June 09, 2009 2:25 PM)  O2 Flow:  Room air  CC: Neck pain, refills on medications/RE   CC:  Neck pain and refills on medications/RE.  History of Present Illness: lost wt from 169 to current 151 today wihtout intenttion per pt, but husband died approx 1 yr ago, and diet has changed in that she is not cooking as muc, only ate 1 meal yesterday;  has had stable for persistent depressive symptoms; still with significant neck pain;  [facility], then event had MRI last wk for the neck;  has f/u tomorrow for the neck - she is hoping for fusion that will finally solve the pain;  pain is not radicular past the shoulder, only on the left, and no LUE numb or weakness;  and no injury, no falls no change or bladder function;  and no fever, night sweats.  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) 8)  Memory Loss  (ICD-780.93) 9)  Depression  (ICD-311) 10)  Preoperative Examination  (ICD-V72.84) 11)  Depressive Disorder  (ICD-311) 12)  Degenerative Disc Disease, Cervical Spine  (ICD-722.4) 13)  Anemia-nos  (ICD-285.9) 14)  Low Back Pain  (ICD-724.2) 15)   Preventive Health Care  (ICD-V70.0) 16)  Hypertension  (ICD-401.9) 17)  Family History Depression  (ICD-V17.0) 18)  Family History of Alcoholism/addiction  (ICD-V61.41) 19)  Alcohol Abuse, Hx of  (ICD-V11.3) 20)  Insomnia  (ICD-780.52) 21)  Migraine Headache  (ICD-346.90) 22)  Hyperlipidemia  (ICD-272.4) 23)  Diverticulitis, Hx of  (ICD-V12.79) 24)  Gerd  (ICD-530.81) 25)  Anxiety  (ICD-300.00)  Medications Prior to Update: 1)  Risperidone 0.25 Mg Tabs (Risperidone) .[name].. 1 By Mouth in The Am, and 4 By Mouth in The Pm 2)  Fluoxetine Hcl 20 Mg  Tabs (Fluoxetine Hcl) .... 3 By Mouth Once Daily 3)  Hydrocodone-Acetaminophen 10-325 Mg Tabs (Hydrocodone-Acetaminophen) .[name].. 1 By Mouth Q 6 Hrs As Needed - To Fill Apr 18, 2009 4)  Alprazolam 0.5 Mg  Tabs (Alprazolam) .[name].. 1 By Mouth Three Times A Day As Needed - To Fill Feb 14, 2009 5)  Lisinopril 10 Mg  Tabs (Lisinopril) .[name].. 1po Once Daily 6)  Pravachol 40 Mg  Tabs (Pravastatin Sodium) .[name].. 1 By Mouth Once Daily 7)  Zolpidem Tartrate 10 Mg Tabs (Zolpidem Tartrate) .[name].. 1po At Bedtime As Needed 8)  Lopressor 50 Mg Tabs (Metoprolol Tartrate) .[name].. 1 By Mouth Two Times A Day 9)  Adult Aspirin Ec Low Strength 81 Mg Tbec (Aspirin) .Marland KitchenMarland KitchenMarland Kitchen  1 By Mouth Once Daily 10)  Budeprion Xl 150 Mg Xr24h-Tab (Bupropion Hcl) .[name].. 1 By Mouth Once Daily 11)  Omeprazole 20 Mg Tbec (Omeprazole) .[name].. 1 By Mouth Once Daily  Current Medications (verified): 1)  Fluoxetine Hcl 20 Mg  Tabs (Fluoxetine Hcl) .... 3 By Mouth Once Daily 2)  Hydrocodone-Acetaminophen 10-325 Mg Tabs (Hydrocodone-Acetaminophen) .[name].. 1 By Mouth Q 6 Hrs As Needed - To Fill Apr 18, 2009 3)  Alprazolam 0.5 Mg  Tabs (Alprazolam) .[name].. 1 By Mouth Three Times A Day As Needed - To Fill Jun 09, 2009 4)  Lisinopril 10 Mg  Tabs (Lisinopril) .[name].. 1po Once Daily 5)  Amitriptyline Hcl 150 Mg Tabs (Amitriptyline Hcl) .[name].. 1 By Mouth At Bedtime 6)  Adult Aspirin Ec Low Strength 81 Mg Tbec (Aspirin) .[name].. 1 By Mouth  Once Daily 7)  Budeprion Xl 150 Mg Xr24h-Tab (Bupropion Hcl) .[name].. 1 By Mouth Once Daily 8)  Omeprazole 20 Mg Tbec (Omeprazole) .[name].. 1 By Mouth Once Daily 9)  Simvastatin 40 Mg Tabs (Simvastatin) .[name].. 1po Once Daily  Allergies (verified): 1)  ! * Codiene 2)  ! Morphine 3)  ! 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         all otherwise negative per pt -  has insomina nightly, and not taking her BP med  Physical Exam  General:  alert and well-developed.   Head:  normocephalic and atraumatic.   Eyes:  vision grossly intact, pupils equal, and pupils round.   Ears:  R ear normal and L ear normal.   Nose:  no external deformity and no nasal discharge.   Mouth:  no gingival abnormalities and pharynx pink and moist.   Neck:  supple and no masses.   Lungs:  normal respiratory  effort and normal breath sounds.   Heart:  normal rate and regular rhythm.   Abdomen:  soft, non-tender, and normal bowel sounds.   Msk:  no joint tenderness and no joint swelling.   Extremities:  no edema, no erythema  Neurologic:  cranial nerves II-XII intact, strength normal in upper extremities, and sensation intact to light touch.   Skin:  color normal and no rashes.   Psych:  normally interactive and moderately anxious.     Impression & Recommendations:  Problem # 1:  Preventive Health Care (ICD-V70.0)  Overall doing well, age appropriate education and counseling updated and referral for appropriate preventive services done unless declined, immunizations up to date or declined, diet counseling done if overweight, urged to quit smoking if smokes , most recent labs reviewed and current ordered if appropriate,  ecg reviewed or declined (interpretation per ECG scanned in the EMR if done); information regarding Medicare Prevention requirements given if appropriate   Orders: T-Vitamin D (25-Hydroxy) (24401-02725) Gastroenterology Referral (GI) TLB-BMP (Basic Metabolic Panel-BMET) (80048-METABOL) TLB-CBC Platelet - w/Differential (85025-CBCD) TLB-Hepatic/Liver Function Pnl (80076-HEPATIC) TLB-Lipid Panel (80061-LIPID) TLB-TSH (Thyroid Stimulating Hormone) (84443-TSH) TLB-Udip ONLY (81003-UDIP)  Problem # 2:  CERVICAL RADICULOPATHY, LEFT (ICD-723.4) stable overall by hx and exam, ok to continue meds/tx as is ; to f/u dr Noel Gerold tomorrow; gets her pain meds here, so will refill ;  urged to only take meds as prescribed  Problem # 3:  WEIGHT LOSS (ICD-783.21)  for cxr today, urged to quit smoking  Orders: T-2 View CXR, Same Day (71020.5TC)  Problem # 4:  DEPRESSION (ICD-311)  Her updated medication list for this problem includes:    Fluoxetine Hcl 20 Mg Tabs (Fluoxetine hcl) .Marland KitchenMarland KitchenMarland KitchenMarland Kitchen 3 by mouth once daily    Alprazolam 0.5 Mg Tabs (Alprazolam) .[name]... 1 by mouth three times a day as needed  - to fill Jun 09, 2009    Amitriptyline Hcl 150 Mg Tabs (Amitriptyline hcl) .[name]... 1 by mouth at bedtime    Budeprion Xl 150 Mg Xr24h-tab (Bupropion hcl) .[name]... 1 by mouth once daily Continue all previous medications as before this visit , stable overall by hx and exam, ok to continue meds/tx as is   Problem # 5:  INSOMNIA-SLEEP DISORDER-UNSPEC (ICD-780.52) for elavil at bedtime as needed   Problem # 6:  HYPERTENSION (ICD-401.9)  The following medications were removed from the medication list:    Lopressor 50 Mg Tabs (Metoprolol tartrate) .[name]... 1 by mouth two times a day Her updated medication list for this problem includes:    Lisinopril 10 Mg Tabs (Lisinopril) .[name]... 1po once daily to re-start the lisonopril only  Complete Medication List: 1)  Fluoxetine Hcl 20 Mg Tabs (Fluoxetine hcl) .... 3 by mouth once daily 2)  Hydrocodone-acetaminophen 10-325 Mg Tabs (Hydrocodone-acetaminophen) .[name].. 1 by mouth q 6 hrs as needed - to fill Apr 18, 2009 3)  Alprazolam 0.5 Mg Tabs (Alprazolam) .[name].. 1 by mouth three times a day as needed - to fill Jun 09, 2009 4)  Lisinopril 10 Mg Tabs (Lisinopril) .[name].. 1po once daily 5)  Amitriptyline Hcl 150 Mg Tabs (Amitriptyline hcl) .[name].. 1 by mouth at bedtime 6)  Adult Aspirin Ec Low Strength 81 Mg Tbec (Aspirin) .[name].. 1 by mouth once daily 7)  Budeprion Xl 150 Mg Xr24h-tab (Bupropion hcl) .[name].. 1 by mouth once daily 8)  Omeprazole 20 Mg Tbec (Omeprazole) .[name].. 1 by mouth once daily 9)  Simvastatin 40 Mg Tabs (Simvastatin) .[name].. 1po once daily  Patient Instructions: 1)  You will be contacted about the referral(s) to: colonoscopy 2)  please quit smoking 3)  please call for your yearly mammogram ; you should call [facility] on [address]  4)  please only use your pain medicatoin as prescribed, as we cannot support overuse, and overuse can lead to severe health issues, as well as possible discharge from the practice 5)  Please go to Radiology in the basement level  for your X-Ray today  6)  Please go to the Lab in the basement for your blood and/or urine tests today  7)  please re-start the Lisinopril for the blood pressure, and all medications below 8)  all of your prescriptions below were sent to the pharmacy except for the alprazolam, and the Aspirin 9)  Take an Aspirin every day - 81 mg -  1 per day - COATED only 10)  Please schedule a follow-up appointment in 2 months. 11)  Please followup with Dr Noel Gerold tomorrow as planned Prescriptions: OMEPRAZOLE 20 MG TBEC (OMEPRAZOLE) 1 by mouth once daily  #90 x 3   Entered and Authorized by:   Corwin Levins MD   Signed by:   Corwin Levins MD on 06/09/2009   Method used:   Electronically to        [facility] [phone]* (retail)       [address]       [place], [place]  [address]       Ph: [phone] or [phone]       Fax: [phone]   RxID:   [id number] BUDEPRION XL 150 MG XR24H-TAB (BUPROPION HCL) 1 by mouth once daily  #90 x 3   Entered and Authorized by:   Corwin Levins MD   Signed by:   Corwin Levins MD on 06/09/2009   Method used:   Electronically to        [facility] [phone]* (retail)       [address]       [place], [place]  [address]       Ph: [phone] or [phone]       Fax: [phone]   RxID:   [id number] ALPRAZOLAM 0.5 MG  TABS (ALPRAZOLAM) 1 by mouth three times a day as needed - to fill Jun 09, 2009  #90 x 1   Entered and Authorized by:   Corwin Levins MD   Signed by:   Corwin Levins MD on 06/09/2009   Method used:   Print then Give to Patient   RxID:   [id number] FLUOXETINE HCL 20 MG  TABS (FLUOXETINE HCL) 3 by mouth once daily  #270 x 3   Entered and Authorized by:   Corwin Levins MD   Signed by:   Corwin Levins MD on 06/09/2009   Method used:   Electronically to        [facility] [phone]* (retail)       [address]       [place], [place]  [address]       Ph: [phone] or [phone]       Fax: [phone]   RxID:   [id number] LISINOPRIL 10 MG  TABS  (LISINOPRIL) 1po once daily  #90 x 3   Entered and Authorized by:   Corwin Levins MD   Signed by:   Corwin Levins MD on 06/09/2009   Method used:   Electronically to        [facility] [phone]* (retail)       [address]       [place], [place]  [address]       Ph: [phone] or [phone]       Fax: [phone]   RxID:   [id number] AMITRIPTYLINE HCL 150 MG TABS (AMITRIPTYLINE HCL) 1 by mouth at bedtime  #30 x 11   Entered and Authorized by:   Corwin Levins MD   Signed by:   Corwin Levins MD on 06/09/2009   Method used:   Electronically to        [facility] [phone]* (retail)       [address]       [place], [place]  [address]       Ph: [phone] or [phone]  Fax: [phone]   RxID:   [id number]

## 2010-03-28 NOTE — Letter (Signed)
Summary: Appt Verification/[facility]  Appt Verification/[facility]   Imported By: Sherian Rein 08/17/2009 15:00:51  _____________________________________________________________________  External Attachment:    Type:   Image     Comment:   External Document

## 2010-03-28 NOTE — Progress Notes (Signed)
Summary: Med Refill  Phone Note Refill Request  on April 08, 2009 11:43 AM  Refills Requested: Medication #1:  HYDROCODONE-ACETAMINOPHEN 10-325 MG TABS 1 by mouth q 6 hrs as needed   Dosage confirmed as above?Dosage Confirmed   Last Refilled: 12/2008   [facility] [address] [phone] Initial call taken by: Scharlene Gloss,  April 08, 2009 11:43 AM  Follow-up for Phone Call        rx faxed to pharmacy Follow-up by: Margaret Pyle, CMA,  April 08, 2009 1:28 PM    New/Updated Medications: HYDROCODONE-ACETAMINOPHEN 10-325 MG TABS (HYDROCODONE-ACETAMINOPHEN) 1 by mouth q 6 hrs as needed - to fill Apr 18, 2009 Prescriptions: HYDROCODONE-ACETAMINOPHEN 10-325 MG TABS (HYDROCODONE-ACETAMINOPHEN) 1 by mouth q 6 hrs as needed - to fill Apr 18, 2009  #120 x 2   Entered and Authorized by:   Corwin Levins MD   Signed by:   Corwin Levins MD on 04/08/2009   Method used:   Print then Give to Patient   RxID:   [id number]  done hardcopy to LIM side B - dahlia  Corwin Levins MD  April 08, 2009 1:11 PM

## 2010-03-28 NOTE — Letter (Signed)
Summary: [facility]  [facility]   Imported By: Sherian Rein 04/21/2009 09:50:58  _____________________________________________________________________  External Attachment:    Type:   Image     Comment:   External Document

## 2010-05-14 LAB — DIFFERENTIAL
Basophils Absolute: 0 10*3/uL (ref 0.0–0.1)
Eosinophils Absolute: 0 10*3/uL (ref 0.0–0.7)
Lymphs Abs: 2.9 10*3/uL (ref 0.7–4.0)
Monocytes Absolute: 0.3 10*3/uL (ref 0.1–1.0)
Neutro Abs: 6.8 10*3/uL (ref 1.7–7.7)

## 2010-05-14 LAB — COMPREHENSIVE METABOLIC PANEL
ALT: 23 U/L (ref 0–35)
AST: 17 U/L (ref 0–37)
Albumin: 3.6 g/dL (ref 3.5–5.2)
CO2: 27 mEq/L (ref 19–32)
Chloride: 101 mEq/L (ref 96–112)
GFR calc Af Amer: >60 mL/min (ref >60)
Glucose, Bld: 121 mg/dL — ABNORMAL HIGH (ref 70–99)
Total Bilirubin: 0.6 mg/dL (ref 0.3–1.2)

## 2010-05-14 LAB — RAPID URINE DRUG SCREEN, HOSP PERFORMED
Amphetamines: NOT DETECTED
Barbiturates: NOT DETECTED
Benzodiazepines: NOT DETECTED
Cocaine: NOT DETECTED

## 2010-05-14 LAB — URINE MICROSCOPIC-ADD ON

## 2010-05-14 LAB — CBC
MCH: 33.2 pg (ref 26.0–34.0)
MCV: 96.1 fL (ref 78.0–100.0)

## 2010-05-14 LAB — ETHANOL: Alcohol, Ethyl (B): <5 mg/dL (ref 0–10)

## 2010-05-14 LAB — VITAMIN B12: Vitamin B-12: 359 pg/mL (ref 211–911)

## 2010-05-14 LAB — URINALYSIS, ROUTINE W REFLEX MICROSCOPIC
Glucose, UA: NEGATIVE mg/dL
Hgb urine dipstick: NEGATIVE
Protein, ur: NEGATIVE mg/dL

## 2010-05-15 LAB — COMPREHENSIVE METABOLIC PANEL
ALT: 19 U/L (ref 0–35)
AST: 23 U/L (ref 0–37)
CO2: 19 mEq/L (ref 19–32)
Calcium: 8.3 mg/dL — ABNORMAL LOW (ref 8.4–10.5)
Chloride: 108 mEq/L (ref 96–112)
GFR calc Af Amer: >60 mL/min (ref >60)
GFR calc non Af Amer: >60 mL/min (ref >60)
Sodium: 136 mEq/L (ref 135–145)

## 2010-05-15 LAB — ETHANOL: Alcohol, Ethyl (B): <5 mg/dL (ref 0–10)

## 2010-05-15 LAB — CBC
HCT: 31 % — ABNORMAL LOW (ref 36.0–46.0)
Hemoglobin: 10.3 g/dL — ABNORMAL LOW (ref 12.0–15.0)
MCHC: 33.3 g/dL (ref 30.0–36.0)
MCHC: 33.5 g/dL (ref 30.0–36.0)
MCV: 103.8 fL — ABNORMAL HIGH (ref 78.0–100.0)
Platelets: 138 10*3/uL — ABNORMAL LOW (ref 150–400)
RBC: 3.19 MIL/uL — ABNORMAL LOW (ref 3.87–5.11)
RBC: 3.59 MIL/uL — ABNORMAL LOW (ref 3.87–5.11)
RDW: 13.7 % (ref 11.5–15.5)
WBC: 6.6 10*3/uL (ref 4.0–10.5)
WBC: 8.3 10*3/uL (ref 4.0–10.5)

## 2010-05-15 LAB — BASIC METABOLIC PANEL
BUN: <1 mg/dL — ABNORMAL LOW (ref 6–23)
CO2: 28 mEq/L (ref 19–32)
CO2: 27 mEq/L (ref 19–32)
CO2: 29 mEq/L (ref 19–32)
CO2: 31 mEq/L (ref 19–32)
CO2: 33 mEq/L — ABNORMAL HIGH (ref 19–32)
Calcium: 7.5 mg/dL — ABNORMAL LOW (ref 8.4–10.5)
Chloride: 102 mEq/L (ref 96–112)
Chloride: 101 mEq/L (ref 96–112)
Chloride: 103 mEq/L (ref 96–112)
Chloride: 105 mEq/L (ref 96–112)
Creatinine, Ser: 0.4 mg/dL (ref 0.4–1.2)
GFR calc Af Amer: >60 mL/min (ref >60)
GFR calc Af Amer: >60 mL/min (ref >60)
GFR calc non Af Amer: >60 mL/min (ref >60)
GFR calc non Af Amer: >60 mL/min (ref >60)
Glucose, Bld: 88 mg/dL (ref 70–99)
Glucose, Bld: 108 mg/dL — ABNORMAL HIGH (ref 70–99)
Glucose, Bld: 87 mg/dL (ref 70–99)
Potassium: 4.4 mEq/L (ref 3.5–5.1)
Potassium: 2.7 mEq/L — CL (ref 3.5–5.1)
Potassium: 2.9 mEq/L — ABNORMAL LOW (ref 3.5–5.1)
Potassium: 3.6 mEq/L (ref 3.5–5.1)
Potassium: 3.7 mEq/L (ref 3.5–5.1)
Sodium: 138 mEq/L (ref 135–145)
Sodium: 140 mEq/L (ref 135–145)
Sodium: 140 mEq/L (ref 135–145)
Sodium: 141 mEq/L (ref 135–145)

## 2010-05-15 LAB — GLUCOSE, CAPILLARY: Glucose-Capillary: 99 mg/dL (ref 70–99)

## 2010-05-15 LAB — CARDIAC PANEL(CRET KIN+CKTOT+MB+TROPI)
CK, MB: 10.4 ng/mL (ref 0.3–4.0)
CK, MB: 5.9 ng/mL — ABNORMAL HIGH (ref 0.3–4.0)
Total CK: 1702 U/L — ABNORMAL HIGH (ref 7–177)
Total CK: 2662 U/L — ABNORMAL HIGH (ref 7–177)
Troponin I: 0.11 ng/mL — ABNORMAL HIGH (ref 0.00–0.06)
Troponin I: 0.39 ng/mL — ABNORMAL HIGH (ref 0.00–0.06)

## 2010-05-15 LAB — CULTURE, BLOOD (ROUTINE X 2): Culture: NO GROWTH

## 2010-05-15 LAB — BLOOD GAS, ARTERIAL
Acid-Base Excess: 0.9 mmol/L (ref 0.0–2.0)
Acid-Base Excess: 7.5 mmol/L — ABNORMAL HIGH (ref 0.0–2.0)
Bicarbonate: 23.2 mEq/L (ref 20.0–24.0)
Bicarbonate: 29.3 mEq/L — ABNORMAL HIGH (ref 20.0–24.0)
FIO2: 0.3 %
FIO2: 0.6 %
MECHVT: 500 mL
MECHVT: 500 mL
O2 Saturation: 98.2 %
PEEP: 5 cmH2O
Patient temperature: 98
RATE: 14 resp/min
RATE: 20 resp/min
TCO2: 20.8 mmol/L (ref 0–100)
pCO2 arterial: 30.4 mmHg — ABNORMAL LOW (ref 35.0–45.0)
pH, Arterial: 7.465 — ABNORMAL HIGH (ref 7.350–7.400)
pO2, Arterial: 247 mmHg — ABNORMAL HIGH (ref 80.0–100.0)
pO2, Arterial: 81.4 mmHg (ref 80.0–100.0)

## 2010-05-15 LAB — LACTIC ACID, PLASMA: Lactic Acid, Venous: 0.8 mmol/L (ref 0.5–2.2)

## 2010-05-15 LAB — DIFFERENTIAL
Eosinophils Absolute: 0 10*3/uL (ref 0.0–0.7)
Eosinophils Relative: 0 % (ref 0–5)
Lymphs Abs: 1.3 10*3/uL (ref 0.7–4.0)

## 2010-05-15 LAB — PROTIME-INR: INR: 1.1 (ref 0.00–1.49)

## 2010-05-15 LAB — TRICYCLICS SCREEN, URINE: TCA Scrn: POSITIVE — AB

## 2010-05-15 LAB — ACETAMINOPHEN LEVEL: Acetaminophen (Tylenol), Serum: <10 ug/mL — ABNORMAL LOW (ref 10–30)

## 2010-05-15 LAB — TRICYCLIC ANTIDEPRESSANT EVALUATION
Amitriptyline and Nortriptyline: 1068 mcg/L (ref 120–250)
Amitriptyline and Nortriptyline: 960 mcg/L — ABNORMAL HIGH (ref 120–250)
Clomipramine.: <5 ng/mL
Clomipramine.: <5 ng/mL
Desipramine DPRAM: <5 mcg/L
Desipramine DPRAM: <5 mcg/L
Doxepin: <5 mcg/L
Imipramine IPRAM: <5 mcg/L
Imipramine IPRAM: <5 mcg/L
Imipramine and Desipramine: <5 mcg/L — ABNORMAL LOW (ref 150–300)
Imipramine and Desipramine: <5 mcg/L — ABNORMAL LOW (ref 150–300)
Nortriptyline NTRIP: 260 mcg/L

## 2010-05-15 LAB — PHOSPHORUS: Phosphorus: 3.7 mg/dL (ref 2.3–4.6)

## 2010-05-15 LAB — MRSA PCR SCREENING: MRSA by PCR: NEGATIVE

## 2010-05-15 LAB — RAPID URINE DRUG SCREEN, HOSP PERFORMED
Amphetamines: NOT DETECTED
Benzodiazepines: NOT DETECTED

## 2010-05-15 LAB — APTT: aPTT: 27 seconds (ref 24–37)

## 2010-05-15 LAB — SALICYLATE LEVEL: Salicylate Lvl: <4 mg/dL (ref 2.8–20.0)

## 2010-05-15 LAB — TYPE AND SCREEN: ABO/RH(D): B POS

## 2010-05-31 LAB — CBC
MCHC: 34.8 g/dL (ref 30.0–36.0)
Platelets: 230 10*3/uL (ref 150–400)
RBC: 4.06 MIL/uL (ref 3.87–5.11)
WBC: 14.4 10*3/uL — ABNORMAL HIGH (ref 4.0–10.5)

## 2010-05-31 LAB — DIFFERENTIAL
Basophils Absolute: 0.3 10*3/uL — ABNORMAL HIGH (ref 0.0–0.1)
Basophils Relative: 2 % — ABNORMAL HIGH (ref 0–1)
Eosinophils Relative: 0 % (ref 0–5)
Lymphocytes Relative: 18 % (ref 12–46)
Lymphs Abs: 2.6 10*3/uL (ref 0.7–4.0)
Monocytes Relative: 3 % (ref 3–12)
Neutro Abs: 11.1 10*3/uL — ABNORMAL HIGH (ref 1.7–7.7)

## 2010-05-31 LAB — URINALYSIS, ROUTINE W REFLEX MICROSCOPIC
Glucose, UA: NEGATIVE mg/dL
Hgb urine dipstick: NEGATIVE
Leukocytes, UA: NEGATIVE
Specific Gravity, Urine: 1.023 (ref 1.005–1.030)
Urobilinogen, UA: 0.2 mg/dL (ref 0.0–1.0)

## 2010-05-31 LAB — COMPREHENSIVE METABOLIC PANEL
ALT: 12 U/L (ref 0–35)
AST: 21 U/L (ref 0–37)
Albumin: 4.6 g/dL (ref 3.5–5.2)
CO2: 25 mEq/L (ref 19–32)
Calcium: 9.4 mg/dL (ref 8.4–10.5)
Creatinine, Ser: 0.54 mg/dL (ref 0.4–1.2)
GFR calc Af Amer: >60 mL/min (ref >60)
GFR calc non Af Amer: >60 mL/min (ref >60)
Sodium: 135 mEq/L (ref 135–145)

## 2010-05-31 LAB — URINE MICROSCOPIC-ADD ON

## 2010-07-11 NOTE — H&P (Signed)
[name], [name]                  ACCOUNT NO.:  [id number]   MEDICAL RECORD NO.:  [id number]          PATIENT TYPE:  INP   LOCATION:  5705                         FACILITY:  [facility]   PHYSICIAN:  Corwin Levins, MD      DATE OF BIRTH:  1945/03/31   DATE OF ADMISSION:  11/23/2006  DATE OF DISCHARGE:                              HISTORY & PHYSICAL   TIME SEEN:  8:10 p.m.   CHIEF COMPLAINT:  Confusion, vomiting, abdominal pain, diarrhea with  loose stool, and elevated lipase.   HISTORY OF PRESENT ILLNESS:  [name] is a 65 year old white female with  a history of alcohol abuse and depression in the past who presents with  the above.  She staggers with ambulation and is at least mildly  confused.  She denies alcohol use tonight, although there has been  suspicion of off and on use and abuse in the past.  There is no fever  per patient in the ER.  Symptoms are improved in the ER after Zofran and  fentanyl, but she is unable to go home due to the symptoms as above.  Rapid right upper quadrant quick scan in the ER per EDP of the right  upper quadrant shows no obvious gallstones.   PAST MEDICAL HISTORY:  1. History of anxiety.  2. Depression.  3. Insomnia.  4. Chronic fatigue.  5. Alcohol abuse with pancreatitis.  6. History of migraine.  7. History of anemia.  8. Hypertension.  9. Hyperlipidemia.  10.History of diverticulitis.  11.GERD.  12.History of peptic ulcer disease, last EGD in the chart June 1991.  13.History of fatty liver.  14.History of colon polyp.  15.Chronic low back pain status post lumbar fusion.  16.History of C-spine disk disease.   ALLERGIES:  CODEINE, MORPHINE, LIPITOR.   CURRENT MEDICATIONS:  1. Ambien 10 mg nightly p.r.n.  2. Crestor 20 mg p.o. daily.  3. Elavil unknown dose.  4. HCTZ unknown dose.  5. Percocet unknown dose.  6. Prozac 40 mg p.o. daily.  7. Xanax unknown dose.   SOCIAL HISTORY:  Tobacco, quit for 6 years per patient, though again  she  is confused.  Alcohol again adamant none.  She states she lives with her  husband and is married.   FAMILY HISTORY:  Significant for diabetes.   REVIEW OF SYSTEMS:  Otherwise noncontributory.   PHYSICAL EXAMINATION:  VITAL SIGNS:  She is afebrile.  Blood pressure  158/88, heart rate 99, respiratory rate 16, O2 saturation 96%.  GENERAL:  She is mildly confused and dose recognize me after I mention  my name as I have seen her in the office as her primary physician.  HEENT:  Sclerae clear.  TMs clear.  Normocephalic, atraumatic.  Pharynx  benign.  NECK:  Without lymphadenopathy, JVD, thyromegaly.  CHEST:  No rales or wheezes.  CARDIAC:  Regular rate and rhythm.  ABDOMEN:  Soft.  Positive bowel sounds.  No organomegaly or masses.  EXTREMITIES:  No edema.  NEURO:  Otherwise, moves all 4 and does cooperate with simple commands.   LABORATORY  DATA:  Lipase 266, electrolytes within normal limits, except  for sodium 133, BUN 11, creatinine 0.6, glucose 109.  LFTs within normal  limits. CPK-MB negative x1.  White blood cell count 9.7, hemoglobin  14.9.  Alcohol ordered and is pending after I requested to the EDP.   ECG sinus rhythm, no acute changes.   ASSESSMENT AND PLAN:  1. Acute mental status changes, unclear etiology, but suspect possibly      alcohol related.  We will check alcohol level as above.  There is      no obvious evidence of trauma to the head or neurological signs and      symptoms other than the confusion, but we will also check head CT      without contrast.  We will also check urine tox screen and UA and      CNS.  2. Elevated lipase with other gastrointestinal symptoms including      abdominal pain, nausea, vomiting, and loose stools consistent with      acute pancreatitis plus or minus other such as necrotic withdrawal      etcetera.  We will check alcohol level as above.  Also a CT of the      abdomen and pelvis, Clostridium difficile toxin, and stool.  We       will check the vital signs.  Make n.p.o., give IV fluids, treat      symptoms, does not appear to need the NG right now.  We will also      need to hold statin, the chance of this may be related to the      elevated lipase and pancreatitis.  3. Hypertension.  She will be given a clonidine patch for now.  4. Questionable alcohol abuse, watch for DTs.  5. Hyponatremia, mild.  Hold the HCTZ and give intravenous fluids as      above.  6. Chronic pain and chronic narcotic use.  Narcotic withdrawal as      etiology as above.  7. Psychiatric, otherwise medications as before.  8. Code status, full code.  9. Prophylaxis.  Get TPI therapy and Lovenox subcutaneously.   DISPOSITION:  Home when improved.      Corwin Levins, MD  Electronically Signed     JWJ/MEDQ  D:  11/23/2006  T:  11/24/2006  Job:  [id number]

## 2010-07-11 NOTE — Assessment & Plan Note (Signed)
[facility]                                 ON-CALL NOTE   NAME:[name], [name]                           MRN:          [id number]  DATE:07/05/2007                            DOB:          01-30-1946    Date of interaction Jul 05, 2007, at 8:28 a.m.  Phone number is [phone].   OBJECTIVE:  The patient has had pancreatitis in the past at least 4  years ago and then 9 months ago, goes to Alcohols Anonymous and does  drink rarely although she has been drinking recently and is feeling  poorly.   ASSESSMENT:  Presumed pancreatitis with alcoholic flare.   PLAN:  Suggested that she go to the emergency room to be seen.  She said  she feels poorly when she goes there because they treat her poorly but I  told her she needs lab work quickly and seeing her in Saturday morning  clinic would not work because they do not get the labs back.  I told her  she has a disease not other process and that her alcohol problems are  inherent and she needs to treat them as a disease.  Primary care  Bricyn Labrada is Dr. Jonny Ruiz and home office is [facility].  Again, she was encouraged  to go to the emergency room to be seen.     Arta Silence, MD  Electronically Signed    RNS/MedQ  DD: 07/05/2007  DT: 07/05/2007  Job #: [id number]

## 2010-07-11 NOTE — Discharge Summary (Signed)
NAME:  [name], [name]                  ACCOUNT NO.:  [id number]   MEDICAL RECORD NO.:  [id number]          PATIENT TYPE:  INP   LOCATION:  5704                         FACILITY:  [facility]   PHYSICIAN:  Valerie A. Felicity [name], MDDATE OF BIRTH:  01-Nov-1945   DATE OF ADMISSION:  11/23/2006  DATE OF DISCHARGE:  11/28/2006                               DISCHARGE SUMMARY   DISCHARGE DIAGNOSES:  1. Acute pancreatitis secondary to alcohol abuse.  2. Right skin lesion; will need outpatient dermatology evaluation.  3. Hypokalemia.  4. Anxiety.  5. Gastroesophageal reflux disease.  6. Dyslipidemia, statin on hold secondary to acute pancreatitis.  7. Degenerative joint disease at baseline.  8. Chronic fatigue syndrome at baseline.  9. Altered mental status on admission, currently now at baseline.   HISTORY OF PRESENT ILLNESS:  [name] is a 65 year old female who was  admitted on November 23, 2006 with chief complaint of confusion,  vomiting, abdominal pain and diarrheal loose stool.  She was also noted  to have an elevated lipase on admission.  She has a past medical history  of alcohol abuse and depression.  She was admitted for further  evaluation and treatment.   PAST MEDICAL HISTORY:  1. Anxiety.  2. Depression.  3. Insomnia.  4. Chronic fatigue.  5. Alcohol abuse with pancreatitis.  6. History of migraine.  7. History of anemia.  8. Hypertension.  9. Hyperlipidemia.  10.History of diverticulitis.  11.History of peptic ulcer disease, last endoscopy June 1991.  12.History of fatty liver.  13.History of colon polyps.  14.Chronic low back pain status post lumbar fusion.  15.History of C-spine disk disease.   COURSE OF HOSPITALIZATION:  1. Acute pancreatitis.  The patient was admitted and was noted to have      elevated lipase as well as a mild elevation in her white blood cell      count.  Her white blood cell count trended downward as well as her      lipase which normalized.  She did  undergo CT of the abdomen and      pelvis which showed stranding and inflammation of the pancreatic      head and duodenum, likely secondary to acute pancreatitis.  The      patient was initially kept n.p.o. and given IV hydration and pain      medications as needed.  Her symptoms improved, and her diet was      advanced.  The patient is currently clinically stable and      tolerating p.o.  She has been advised to discontinue alcohol use      completely.  2. Hypokalemia.  The patient did develop hypokalemia during this      admission which was repleted, and potassium at time of discharge      was 3.8.  This will need follow-up as an outpatient.   MEDICATIONS:  At time of discharge:  1. Prozac 40 mg p.o. daily.  2. Norvasc 10 mg p.o. daily.  3. Ativan 0.5 mg p.o. q.8h. p.r.n. anxiety, dispense #30 with 0  refills.  4. Ambien 10 mg p.o. h.s. as needed.  5. Protonix 40 mg p.o. daily, dispense 30 with 1 refill.   PERTINENT LABORATORIES:  At time of discharge, BUN 5, creatinine 0.61,  sodium 142 potassium 3.8.  Stool negative for clostridium difficile.   DISPOSITION:  The patient will be discharged to home.   FOLLOW UP:  The patient has been referred to Alcoholics Anonymous per  social work during this admission.  She was instructed to follow up with  Dr. Oliver Barre in 1-2 weeks.      Sandford Craze, NP      Laurie Rover. Felicity Coyer, MD  Electronically Signed    MO/MEDQ  D:  11/28/2006  T:  11/29/2006  Job:  [id number]   cc:   Corwin Levins, MD

## 2010-07-14 NOTE — Consult Note (Signed)
NAMEKEYARIA, [name]                  ACCOUNT NO.:  [id number]   MEDICAL RECORD NO.:  [id number]          PATIENT TYPE:  INP   LOCATION:  1610                         FACILITY:  [facility]   PHYSICIAN:  Iva Boop, M.D. LHCDATE OF BIRTH:  August 26, 1945   DATE OF CONSULTATION:  06/17/2005  DATE OF DISCHARGE:                                   CONSULTATION   ADDENDUM  I think [name] may be back into alcohol.  She did admit to drinking some  an in further reflecting upon this, that may be part of what her problem is.  We will need to clarify this with her further, but it could explain,  certainly, at least the elevated blood pressure that she is having and some  of her symptoms.  I do not know that it would explain her watery diarrhea,  at least on a chronic basis, though we do know alcohol can cause diarrhea in  some patients.      Iva Boop, M.D. [facility]  Electronically Signed     CEG/MEDQ  D:  06/17/2005  T:  06/19/2005  Job:  [id number]   cc:   Corwin Levins, M.D. [facility]  [address]  [place]  [place] [address]

## 2010-07-14 NOTE — Discharge Summary (Signed)
[name], [name]                  ACCOUNT NO.:  [id number]   MEDICAL RECORD NO.:  [id number]          PATIENT TYPE:  INP   LOCATION:  1610                         FACILITY:  [facility]   PHYSICIAN:  Rene Paci, M.D. LHCDATE OF BIRTH:  Dec 01, 1945   DATE OF ADMISSION:  06/15/2005  DATE OF DISCHARGE:  06/19/2005                                 DISCHARGE SUMMARY   DISCHARGE DIAGNOSES:  1.  Diarrhea/nausea/vomiting with weight loss.  2.  Hypertension.  3.  Anxiety.  4.  History of alcohol abuse with probable continuing abuse.  5.  Fatty liver.  6.  Chronic back pain.   HISTORY OF PRESENT ILLNESS:  The patient is a 65 year old white female who  reports a 26-pound weight loss in the 4 weeks prior to admission.  She also  reports recurrent nausea and vomiting since early in April, at which time  she reports that she thought she had the flu.  She also noted some diarrhea  in the last week.  The patient was admitted for further evaluation and  treatment.   PAST MEDICAL HISTORY:  1.  Chronic low back pain.  2.  Anxiety/depression.  3.  Migraines.  4.  Insomnia.  5.  Chronic fatigue syndrome.  6.  GERD.  7.  Diverticulitis and diverticulosis.  8.  Degenerative joint disease.  9.  Peptic ulcer disease.  10. Hypertension.  11. Dyslipidemia.  12. Anemia.  13. Alcohol abuse.   COURSE OF HOSPITALIZATION:  Problem #1.  Nausea, vomiting and diarrhea.  The  patient was admitted and was noted to be mildly hypokalemic, and potassium  was repleted.  A GI consult was obtained and the patient was seen by Dr.  Leone Payor.  Stool studies were performed at the time of this dictation.  Stool  for C. diff. has been negative x3.  Stool culture is pending.  The patient  was treated with Lomotil and will be treated empiric p.o. Flagyl for an  additional 7 days.  The patient underwent a CT of the abdomen and pelvis,  which was negative with the exception of noting a fatty liver.   Problem #2.   Hypertension.  The patient was noted to have an elevated blood  pressure on admission and was changed to a Catapres patch at the time of  admission presumably secondary to nausea and vomiting.  At the time of  discharge, she will be continued on her home p.o. dosing of clonidine, which  will need outpatient follow up.  Consider additional hypertension workup if  no improvement.   Problem #3.  Anxiety/EtOH abuse.  The patient was unclear on current alcohol  use; however, does admit to recent drinking.  Suspect the patient is  continuing to of use alcohol.  It was suggested to the patient that she  attend AA meetings.   MEDICATIONS AT DISCHARGE:  1.  Flagyl to 50 mg p.o. q.i.d. per GI team.  2.  Lomotil p.r.n. diarrhea.  3.  Protonix 40 mg p.o. b.i.d.  4.  Prozac 40 mg p.o. daily.  5.  Crestor 20 mg  p.o. daily.  6.  Ativan 1 mg p.o. t.i.d. as needed.  7.  Clonidine 0.1 mg p.o. b.i.d.  8.  Ambien 10 mg at bedtime.   DISPOSITION:  Plan to transfer patient to home.   PERTINENT LABORATORY AT DISCHARGE:  BUN 1, creatinine 0.6, hemoglobin 13.4,  hematocrit 38.9, potassium 3.4 prior to repletion.   FOLLOW UP:  The patient is scheduled to follow up with Dr. Lina Sar on  May 29 at 10:30 a.m.  She was also scheduled to follow up with Dr. Oliver Barre on May 8 at 10 a.m.      Melissa S. Peggyann Juba, NP      Rene Paci, M.D. [facility]  Electronically Signed    MSO/MEDQ  D:  06/19/2005  T:  06/20/2005  Job:  [id number]   cc:   Lina Sar, M.D. [facility]  [address]  [place]  [place] [address]

## 2010-07-14 NOTE — Op Note (Signed)
NAME:  [name], [name]                  ACCOUNT NO.:  [id number]   MEDICAL RECORD NO.:  [id number]          PATIENT TYPE:  OIB   LOCATION:  5029                         FACILITY:  [facility]   PHYSICIAN:  Sharolyn Douglas, M.D.        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  History of previous C5-6 fusion.   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  History of previous C5-6 fusion.   OPERATION PERFORMED:  1.  Exploration of C5-6 fusion.  2.  Anterior cervical diskectomy, C6-7.  3.  Anterior cervical arthrodesis, C6-7 with placement of allograft      prosthesis spacer packed with local autogenous bone graft.  4.  Anterior cervical plating C6-7 using the Abbott spine system.   SURGEON:  Sharolyn Douglas, M.D.   ASSISTANT:  Verlin Fester, P.A.   ANESTHESIA:  General endotracheal.   ESTIMATED BLOOD LOSS:  Minimal.   COMPLICATIONS:  None.   INDICATIONS FOR PROCEDURE:  The patient is a pleasant 65 year old female  with past history significant for previous uninstrumented C5-6 fusion and  also previous posterior laminoforaminotomy.  She has developed severe left  upper extremity pain consistent with C7 radiculopathy.  Her plain  radiographs and MRI scan show a possible pseudoarthrosis at C5-6 and also  severe degenerative changes below the fusion at C6-7 with foraminal  narrowing.  Because of her severe symptoms unresponsive to conservative  care, she has elected to undergo ACDF of C6-7, exploration of her fusion in  hopes of improving the symptoms.  Risks and benefits were reviewed.   DESCRIPTION OF PROCEDURE:  The patient was identified in the holding area  and taken to the operating room, underwent general endotracheal anesthesia  without difficulty, was given prophylactic intravenous  antibiotics.  Carefully positioned on the operating table, five pounds of halter traction  was applied. The neck was placed in slight extension.  Her neck was prepped  and draped in the usual sterile fashion.  A small transverse incision was  made on the left side below her previous incision.  Dissection was carried  sharply through the platysma.  The scar tissue was carefully dissected and  the interval between sternocleidomastoid and strap muscles medially was  developed down to the prevertebral space.  The esophagus, trachea, carotid  sheath were identified and protected at all times.  The C6-7 disk was  identified.  Needle was placed and x-ray taken to confirm location.  We then  exposed the anterior cervical spine by elevating the longus colli muscle out  over the previous fusion at C5-6 and over the C6-7 disk space.  Deep  retractors were placed.  We then explored the C5-6 fusion and it was found  to be solid.  We then placed Caspar distraction pin in the C6 and C7  vertebral bodies.  Gentle distraction was applied.  The Leica microscope was  draped  and brought into the field.  The remainder of the operation was done  with the surgical microscope.  Complete diskectomy was completed back to the  posterior longitudinal ligament.  The disk material was degenerative and the  disk space was narrowed.  High speed bur used to take down the uncovertebral  joints.  The cartilaginous end plates were scraped.  A Kerrison punch was  then used to take down the posterior longitudinal ligament and complete wide  foraminotomies.  We evaluated the spinal canal and neural foramen and they  were felt to be completely decompressed.  FloSeal was used for hemostasis.  We then placed a 7 mm allograft prosthesis spacer which had been packed with  local bone graft obtained from the drill shavings.  This was countersunk 1  mm into the interspace.  We placed a 24 mm Abbott spine plate with four 12  mm locking  screws.  We ensured that the locking mechanism engaged.  Intraoperative x-rays showed good position of the plate and interbody  spacer.  The wound was irrigated.  The esophagus, trachea and carotid sheath  were examined.  There were no apparent injuries.  A deep Penrose drain was  left in place.  Platysma was closed with an interrupted 2-0 Vicryl.  Subcutaneous layer closed with interrupted 3-0 Vicryl followed by a running  4-0 subcuticular Vicryl suture on the skin edges.  Benzoin and Steri-Strips  were placed.  Sterile dressing applied.  The patient was placed into a soft  collar, she was extubated without difficulty and transferred to the recovery  room in stable condition able to move her upper and lower extremities.      Sharolyn Douglas, M.D.  Electronically Signed     MC/MEDQ  D:  11/29/2004  T:  11/30/2004  Job:  [id number]

## 2010-07-14 NOTE — Discharge Summary (Signed)
NAME:  RAFEEF, [name]                            ACCOUNT NO.:  [id number]   MEDICAL RECORD NO.:  [id number]                   PATIENT TYPE:  INP   LOCATION:  0380                                 [facility]ACILITY:  [facility]   PHYSICIAN:  Rene Paci, M.D. [facility]          DATE OF BIRTH:  [birth year]   DATE OF ADMISSION:  09/24/2002  DATE OF DISCHARGE:  09/29/2002                                 DISCHARGE SUMMARY   DISCHARGE DIAGNOSES:  1. Acute alcoholic pancreatitis, resolved, tolerating regular diet without     nausea, vomiting, or abdominal pain.  2. Acute alcohol withdrawal, status post Librium protocol, medically     stabilized.  3. Heavy alcohol abuse.  4. Hypokalemia secondary to above, resolved.  Discharge potassium 3.7.  5. Chronic pain and anxiety disorder.  6. Macrocytic anemia.  Discharge hemoglobin 11.2, hemodynamically stable.     Secondary to alcohol abuse.   DISCHARGE MEDICATIONS:  1. Protonix 40 mg p.o. daily.  2. Thiamine 100 mg p.o. daily.  3. Elavil 200 mg p.o. q.h.s.  4. Xanax 0.25 mg p.o. q.8h. p.r.n. anxiety.  5. Vicodin 5/500, 1-2 every six hours p.r.n. pain.   DISPOSITION:  The patient is being discharged to home, status post inpatient  alcohol detoxification, with strong recommendations to pursue alcohol  rehabilitation with [facility] or various AA meetings.  She has  been given information from several staff members including care management  and nursing and at this time is reluctant to go.  The patient says that she  will discontinue all alcohol use.   CONDITION ON DISCHARGE:  Medically stable.   FOLLOW-UP:  With her primary care physician, Dr. Efrain Sella, in the next few  weeks to be scheduled by me prior to discharge.   HOSPITAL COURSE:  #1 - ACUTE ALCOHOLIC PANCREATITIS:  The patient is a  pleasant 65 year old white female who presented to the emergency room with  two to three days of increasing nausea and abdominal pain.  On the day of  admission she began retching uncontrollably and unable to tolerate anything  including water or medications on her stomach.  She was found to have an  elevated lipase of 2081, and she was admitted with pancreatitis for IV  fluid, p.r.n. narcotics, and bowel rest.  Workup for the etiology of her  pancreatitis was negative for gallstones, hypertriglyceridemia, or drug side  effect.  The patient was admitted and confided that she drinks heavily rum,  on the low side 2 fifths per week up to 1 fifth daily on various other  reports.  She was given p.r.n. Ativan for agitation and monitored closely  for signs of withdrawal.  By the third day of hospitalization she had been  tolerating fluids without difficulty.  No pain, nausea, or vomiting even  without p.r.n. narcotics.  She was advanced to a regular diet which she  tolerated without problem.  Her alcoholic  pancreatitis has resolved without  apparent complication.  No imaging was obtained to look for pseudocyst as  she has remained afebrile and without apparent complications.  She was  reminded to discontinue alcohol use.   #2 - ALCOHOL WITHDRAWAL:  Forty-eight hours into hospitalization the patient  became agitated, with increasing heart rate and low-grade fever of 99.1.  Though she had been given low-dose p.r.n. Ativan it appeared the patient was  quickly entering alcohol withdrawal.  On that day, 48 hours since her last  drink, she was begun on the Librium protocol for inpatient detoxification  and transferred to a telemetry bed.  She was counseled by care management  and other hospital staff regarding her alcohol use.  The patient developed  strong denial about the degree of her alcohol use that she had previously  confided and was refusing inpatient treatment and the alcohol rehabilitation  program.  After 72 hours of Librium protocol, successful complete without  adverse outcome, the patient was felt to be medically stable for discharge   and says that she will consider outpatient rehabilitation program to help in  her quest to discontinue alcohol use.  Of note, the patient reports drinking  heavily to help treat her chronic back pain for which she is tired of taking  Vicodin and also because the Vicodin is more expensive than the rum.   #3 - HISTORY OF CHRONIC PAIN AND ANXIETY:  The patient has a long history of  back and leg problems requiring orthopedic surgeries for spinal stenosis,  most recently as of August 2003.  She is disabled because of this patient.  We recommended that she return to the care of Dr. Simonne Come and Dr. Priscille Kluver  of orthopedics for continued pain medication and possible referral to a pain  clinic rather than turning to alcohol for these purposes.   #4 - MACROCYTIC ANEMIA:  As the patient was hydrated her hemoglobin dropped  to a low of 10.7.  MCV rose to 100.  Thiamine and folate levels were checked  and were within normal limits.  She was supplemented with p.o. thiamine as  part of her protocol from alcohol withdrawal, and it was thought that the  macrocytic anemia was likely due to her alcohol abuse.  This can be followed  up on an as-needed basis.   #5 - HYPERGLYCEMIA:  The patient had repeated mildly elevated glucose in the  140s.  With her pancreatitis and alcohol abuse she may be developing early  diabetes.  An A1C was not checked on this admission and is deferred to her  primary care physician to follow up on an as-needed basis.                                               Rene Paci, M.D. [facility]    VL/MEDQ  D:  09/29/2002  T:  09/30/2002  Job:  [id number]

## 2010-07-14 NOTE — Op Note (Signed)
[name], [name]                           ACCOUNT NO.:  [id number]   MEDICAL RECORD NO.:  [id number]                   PATIENT TYPE:  INP   LOCATION:  0455                                 FACILITY:  [facility]   PHYSICIAN:  Patricia Nettle, M.D.                  DATE OF BIRTH:  1945/05/07   DATE OF PROCEDURE:  10/07/2001  DATE OF DISCHARGE:                                 OPERATIVE REPORT   PREOPERATIVE DIAGNOSES:  1. 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  2. Diskogenic back pain, L4-5.  3. Status post lumbar laminectomy and in situ fusion L5-S1.   POSTOPERATIVE DIAGNOSES:  1. 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  2. Diskogenic back pain, L4-5.  3. Status post lumbar laminectomy and in situ fusion L5-S1.   PROCEDURE:  1. Revision lumbar laminectomy, L4-5 with decompression of the comon dural     sac and nerve roots bilaterally.  2. Transforaminal lumbar interbody fusion, L4-5 utilizing a NuVasive 11 x 12     x 25 mm allograft prosthesis spacer.  3. Exploration of L5-S1 fusion.  4. Pedicle screw instrumentation, L4-5, utilizing the [employer] system.  5. Local autogenous bone graft.  6. Allograft utilizing AlloMatrix Crunch.  7. Neural monitoring utilizing both triggered and free-running EMG's.   SURGEON:  Patricia Nettle, M.D.   ASSISTANT:  Jill Side Mahar, P.A.-C.   ANESTHESIA:  General endotracheal.   COMPLICATIONS:  None.   INDICATIONS:  The patient is a 65 year old female with a past history  significant for L5-S1 diskectomy in 1992, followed by in situ fusion in  1989.  She did well for many years until approximately 14 months ago, when  she developed progressively worsening back and lower extremity pain, left  greater than right.  At this point, her pain is described as disabling,  radiates into the left buttock and posterolateral  thigh and calf.   She  describes numbness in her entire left foot.  On the right side, she has  similar but less severe symptoms.  She rates her pain a 7/10 on a visual  analog scale.  She is taking escalating doses of narcotics.  Physical  examination showed some give-away weakness about the left tibial os anterior  EHL.  There is some decreased sensation in the left lower extremity at L4  and 5 distribution.  Plain x-rays showed a mild degenerative  spondylolisthesis at L4-5 above a previous L5-S1 in situ fusion.  Her CT and  myelogram showed 1-2 mm of motion on the flexor extensor views at the L4-5  segment.  In addition, there is a broad-based disk bulge with slight  ligamentous hypertrophy causing moderate central and lateral recess  stenosis.  Risks, benefits, alternatives of revision spinal surgery were  reviewed with the patient in detail.  She elected to proceed in the hopes of  improving her symptomatology.   DESCRIPTION OF PROCEDURE:  The patient was properly identified in the  holding area and taken to the operating room.  She underwent general  endotracheal anesthesia without difficulty.  She was turned prone onto the  Acumed four poster positioning frame.  All bony prominences were padded.  The patient's eyes were well-protected at all times.  Back was prepped and  draped in the usual sterile fashion.  The previous midline incision was  utilized.  The old scar was excised.  Dissection was carried down to the  deep fascia.  There was scarring related to the previous surgery.  The deep  fascia was incised and the paraspinal muscles stripped out periosteally,  exposing the L4 transverse process.  Care was taken to protect the L3-4  capsule.  We then carefully elevated the paraspinal muscles off the previous  fusion mass, identifying the previous edges of the laminectomy.  We explored  the L5-S1 fusion.  This appeared to be solid with no observable motion.  We  placed our deep retractors.  We  used intraoperative fluoroscopy to confirm  our levels.  We then began our laminectomy by removing the residual spinous  process of L4.  There was a significant amount of epidural fibrosis in the  L4-5 interspace.  Using loupe magnification and headlight and  microdissection technique, we were able to elevate the epidural fibrosis off  the underlying lamina and complete a central laminectomy up to the inferior  margin of L3.  We then carefully worked out laterally using a high-speed bur  and microcurettes to dissect the epidural fibrosis.  The L4 and 5 nerve  roots were identified bilaterally and radically decompressed.  On the left  side, we performed a facetectomy by removing the inferior articular process  of L4 and the superior articular process of L5.  We controlled epidural  bleeders with bipolar electrocautery.  We were able to identify the disk  space and by protecting the edge of the L4 nerve root, completed a radical  diskectomy using straight and angled and up-and-down curettes.  We  distracted with interbody spacers up to 12.  We then irrigated the disk  space.  We cleaned and morselized the bone that had been collected from the  laminectomy and spinous processes.  This was packed tightly into the disk  space.  It became evident that this was packed all the way across the other  side.  We then placed an 11 x 12 x 25 mm invasive allograft prosthesis  spacer.  Using fluoroscopy, we kicked the graft across to the other side and  then to the middle third of the vertebral body in the lateral plane.  We  then proceeded with placing pedicle screws at L5 and L4 bilaterally.  We  used both anatomic landmarks as well as fluoroscopy to identify the starting  points.  An anatomic probing technique was utilized.  We placed 6.25 x 45 mm  screws at L5 and 6.25 x 40 mm screws at L4.  We stimulated the screws  utilizing triggered EMG's and got a response at greater than 20 milliamps in each  case.  We were also able to palpate the medial border of the pedicles  from within the canal, and there was no breach.  We placed our rods with the  appropriate locking caps.  Gentle compression was applied across the rods  segmentally.  We then decorticated the  L4 transverse process with a high-  speed bur.  The previous fusion mass at L5 was then decorticated.  We packed  the remaining autogenous bone and supplemented this with 20 cc of AlloMatrix  allograft mix.  This was all packed tightly into the lateral gutters.  We  then irrigated and examined the dura.  There was a small bleb noted in the  midline underneath the residual lamina of L3.  This was examined.  There was  no CSF leak, although the arachnoid appeared to be visible.  We placed a  small piece of DuraGen over the bleb and then placed fiber and glue over  this.  Gelfoam was then placed over the exposed epidural space.  We checked  the L4 and 5 nerve roots bilaterally with a blunt probe, and they were free  out their respective foramen.  The deep fascia was closed with a running #1  Vicryl suture.  A deep drain was placed.  The subcutaneous layer was closed  interrupted 2-0 Vicryl followed by a running 3-0 nylon on the skin.  It  should be noted that there were no documented changes in the free running  EMG's throughout the procedure.  The patient was turned supine, extubated  without difficulty, and transferred to the recovery room in stable  condition.                                                 Patricia Nettle, M.D.    MWC/MEDQ  D:  10/07/2001  T:  10/09/2001  Job:  [id number]

## 2010-07-14 NOTE — Discharge Summary (Signed)
[name], [name]                              ACCOUNT NO.:  [id number]   MEDICAL RECORD NO.:  [id number]                    PATIENT TYPE:   LOCATION:                                       FACILITY:  [facility]   PHYSICIAN:  Patricia Nettle, M.D.                  DATE OF BIRTH:   DATE OF ADMISSION:  10/07/2001  DATE OF DISCHARGE:  10/13/2001                                 DISCHARGE SUMMARY   ADMISSION DIAGNOSES:  1. 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  2. Diskogenic back pain L4-5.  3. Previously L5-S1 laminectomy and fusion.  4. Depression.   DISCHARGE ACTIVITIES:  1. 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  2. Diskogenic back pain L4-5.  3. Previously L5-S1 laminectomy and fusion.  4. Depression.  5. Status post revision of lumbar laminectomy and L4-5 decompression with     trans foraminal lumbar antibody fusion, pedicle screw instrumentation     with Monarch system L4-5 with local autogenous bone grafting and Allo     matrix crunch and Allograft bone grafting.   CONSULTATIONS:  None.   PROCEDURE:  As above and per operative note.   HISTORY OF PRESENT ILLNESS:  Ms. Madariaga is a 65 year old female with past  history significant for L5-S1 diskectomy in 1992 followed by in situ fusion  in 1989. She did well for many years until approximately fourteen months  ago. She has had progressively worsening back pain with lower extremity  pain. The pain has been disabling and plain x-rays have shown mild  degenerative spondylolisthesis L4-5 above the previous fusion. CT myelogram  also has shown 1-2 mm of motion. She was noted to have a broad based disk  bulge with slight ligamentous hypertrophy, causing moderate central  stenosis. This time extension of the fusion and decompression was indicated.  Risks and benefits have been discussed with the patient. She is in agreement  to  proceed.   HOSPITAL COURSE:  The patient underwent procedure and tolerated this well.  All appropriate IV antibiotics and analgesics were utilized. Postop she was  placed in an Aspen brace. She was monitored and weaned from her IV  medications and IV's. She had slow advance of her diet from clear liquids to  full. Her GI status was monitored. All in all, she did extremely well having  positive flatus as well as bowel movements. She did have a little bit of  over sedation, as she had taken a home medication of Soma in addition to  what she was receiving in the hospital. She was found one time on the floor.  However, she had no significant fall associated with this. She eased herself  down gently. There was no evidence of any disruption of her surgical  procedure. X-rays were obtained. She  was alert and oriented and monitored  closely for 24 hours after this incident. On October 13, 2001 she was alert  and oriented, recalled the events, understood all medications and  interactions. She was afebrile. Vital signs were stable. Her potassium was  noted to be 3.0 and she was given an oral supplement prior to discharge. She  was instructed for multivitamin use and high potassium foods. Hemoglobin was  stable at 9.9 and after only receiving her autologous transfusion postop she  was noted to be stable with no significant postop anemia. All in all, she  was felt medically stable. She was ambulating a distance of 200 feet. Her  left leg pain was relieved. Her back pain was improving. All in all she was  stable to discharge home to the care of her family who would be there with  her postop.   LABORATORY DATA:  Admission labs with a preoperative hemoglobin not found in  the chart. Preoperative was 9.6 and postop 9.9 after receiving two units of  autologous blood. Postop chemistry showed potassium of 3.0. Otherwise  normal.   DIAGNOSTIC STUDIES:  Standing lumbar films showed effusion with stable   appearance and mild gaseous distention was noted in the abdomen.  Intraoperative C-arm films are noted and preoperative  chest x-ray shows no  acute disease. EKG shows sinus rhythm with possible right atrial  abnormality. No previous EKG is noted.   CONDITION ON DISCHARGE:  Stable and improved.   PLAN:  The patient is discharged to home.   ACTIVITY:  She is to wear a brace at all times while up. She may shower at  this time. Ambulation with rolling walker for support encouraged. Back  precautions.   FOLLOW UP:  In two weeks. To call for time.   DISCHARGE MEDICATIONS:  1. Percocet as needed for pain, #40.  2. Robaxin 500 mg, #30.  3. She is resume all her home psych medications and monitor for over     sedation.  4. She is to avoid Soma.  5. Avoid NSAIDS.   DISPOSITION:  To home.        Tracy A. Shuford, P.A.-C.                 Patricia Nettle, M.D.    TAS/MEDQ  D:  10/13/2001  T:  10/13/2001  Job:  [id number] [id number]   cc:   Patricia Nettle, M.D.

## 2010-07-14 NOTE — H&P (Signed)
NAME:  [name], [name]                  ACCOUNT NO.:  [id number]   MEDICAL RECORD NO.:  [id number]          PATIENT TYPE:  AMB   LOCATION:                               FACILITY:  [facility]   PHYSICIAN:  Sharolyn Douglas, M.D.        DATE OF BIRTH:  1945-05-28   DATE OF ADMISSION:  11/29/2004  DATE OF DISCHARGE:                                HISTORY & PHYSICAL   CHIEF COMPLAINT:  Pain in my neck and left arm.   HISTORY OF PRESENT ILLNESS:  This 65 year old female seen by [name] for  continuing problems concerning pain into her cervical spine with radiation  into the left upper extremity (dominant arm).  She has developed decreased  sensation in the left upper extremity, most specifically thumb, index, and  middle fingers.  She has had difficulty grasping things and even picking up  a jar of jelly out of her refrigerator.  She is unable to hold onto it.  She  is also dropping other things.  She has popping into the cervical spine  which markedly increases her pain and discomfort as well as sensory changes.  She has had selective nerve root block on the left which provided her with  short term relief but all of her pain returned unfortunately, and now she is  quite desperate to have something done.  She has a small granddaughter and  is extremely disappointed that she can not hold her or even play with her  due to her pain and discomfort.  After much discussion including the risks  and benefits of surgery, we have decided to go ahead with cervical fusion at  C6-C7.   The patient has been cleared preoperatively by Dr. Dr. Jonny Ruiz for this  surgical procedure.   PAST MEDICAL HISTORY:  This lady has been in relatively good health  throughout her lifetime.   She is currently on:  1.  Amitriptyline 150 mg daily.  2.  Ambien 10 mg for sleep.  3.  Lipitor 80 mg a day for dyslipidemia.  4.  Fluoxetine 20 mg for depression.   She is allergic to CODEINE.   Dr. Oliver Barre is her medical physician.   1.   She has a remote history of ulcer disease caused by anti-inflammatories.  2.  She has had a lumbar laminectomy back in 1983.  3.  Removal of scar tissue in 1987.  4.  Fusion of the lumbar spine in 1989 and repair of that fusion in 1991.  5.  She had an anterior cervical diskectomy and fusion, by Dr. Otelia Sergeant, in      1991.  6.  L4-L5 back fusion in 2003 by Dr. Noel Gerold.   FAMILY HISTORY:  Positive for heart disease in her brother and arthritis in  the mother.   SOCIAL HISTORY:  The patient is married.  She is disabled.  No intake of  alcohol or tobacco products.  She has two children.  Her husband will be  caregiver after surgery.   REVIEW OF SYSTEMS:  CNS:  No seizure disorder, paralysis, numbness,  double  vision other than in present illness left upper extremity.  CARDIOVASCULAR:  No chest pain, no angina, no orthopnea.  RESPIRATORY:  No productive cough,  no hemoptysis, no shortness of breath.  GASTROINTESTINAL:  No nausea,  vomiting, melena, bloody stools.  GENITOURINARY:  No discharge, dysuria,  hematuria.  MUSCULOSKELETAL:  Primarily in present illness.   PHYSICAL EXAMINATION:  GENERAL:  Alert, and cooperative, friendly, somewhat  anxious, 65 year old, white female.  VITAL SIGNS:  Blood pressure 120/80, pulse 84, respirations 16.  HEENT:  Normocephalic.  PERLA.  EOMs intact.  Oropharynx is clear.  CHEST:  Clear to auscultation.  No rhonchi.  No rales.  HEART:  With a regular rate and rhythm.  No murmurs are heard.  ABDOMEN:  Soft, nontender.  Liver spleen not felt.  GENITALIA/RECTAL/PELVIC/BREASTS:  Not done.  Not pertinent to present  illness.  EXTREMITIES:  Sensory changes in the left upper extremity and weakness on  extension.   ADMITTING DIAGNOSES:  1.  Cervical spondylotic radiculopathy.  2.  Hypertension.  3.  Dyslipidemia.  4.  Depression.   PLAN:  The patient will undergo anterior cervical diskectomy and fusion C6-  C7 with Allograft and plate.  She has a soft  cervical collar which will be  used postoperatively.   This history and physical was performed in our office on November 15, 2004.      [name].      Sharolyn Douglas, M.D.  Electronically Signed    DLU/MEDQ  D:  11/15/2004  T:  11/15/2004  Job:  [id number]   cc:   Corwin Levins, M.D. [facility]  [address]  [place]  [place] [address]

## 2010-07-14 NOTE — Consult Note (Signed)
[name], [name]                  ACCOUNT NO.:  [id number]   MEDICAL RECORD NO.:  [id number]          PATIENT TYPE:  INP   LOCATION:  2956                         FACILITY:  [facility]   PHYSICIAN:  Iva Boop, M.D. LHCDATE OF BIRTH:  1945-03-25   DATE OF CONSULTATION:  06/17/2005  DATE OF DISCHARGE:                                   CONSULTATION   REQUESTING PHYSICIAN:  Dr. Marga Melnick   PRIMARY CARE PHYSICIAN:  Dr. Oliver Barre   REASON FOR CONSULTATION:  Diarrhea.   ASSESSMENT:  Acute to subacute diarrhea symptoms, unclear etiology.  This is  in the setting of discontinuing amitriptyline after taking for 20+ years.  There is a 20-some-pound weight loss over the past month as well.  There is  also a problem with acute anxiety and situational stressors.  Etiology of  the diarrhea is not apparent.  It is watery, so  infection is certainly  quite likely.   RECOMMENDATIONS AND PLAN:  Agree with stool studies.  She had a colonoscopy  in 2005 so would not rush to that, though a sigmoidoscopy or colonoscopy may  be indicated depending upon the stool studies.  Note that a CT scan of the  abdomen and pelvis has already been unremarkable basically without any  obvious etiology of her problems.  Agree with the use of Lomotil as well for  the time being.   As far as her weight loss, it could be coming off of the amitriptyline,  which is actually why she stopped it though I would not expect such a rapid  weight loss change.  The diarrhea may  be having some influence as well.   HISTORY:  This is a 65 year old white woman that was admitted on June 15, 2005, after she saw Dr. Jonny Ruiz complaining of nausea and vomiting for several  days.  She had abdominal pain, questionable fever.  She had 26-pound weight  loss in 4 weeks.  Symptoms started about 3 weeks ago.  They gradually  worsened and has had nausea and vomiting, thought she had a gastrointestinal  virus or infection.  No rectal  bleeding.  A friend died last week.  She  attended the funeral and an increase in symptoms were noted.  She was mildly  dizzy in the office, very tearful, with some abdominal pain.  She was  keeping her Percocet down for chronic pain.  Since hospitalization she has  improved, though she misses her Prozac which she is not on, she tells me.  Her hemoglobin has been stable.  She has not had a fever.  Her white count  is minimally elevated.  She has multiple loose stools and has had some at  night.  She does not think that the stools have changed after her CT scan  contrast.   PAST MEDICAL HISTORY:  1.  Chronic low back pain with C-spine disc disease, multiple back      surgeries.  2.  Anxiety and depression.  3.  Migraine.  4.  Insomnia.  5.  Chronic fatigue.  6.  History  of alcoholic pancreatitis.  7.  Apparently, history of diverticulosis or diverticulitis.  8.  Denies history of irritable bowel syndrome.  9.  History of colon polyp.  I do not have the records, apparently 2005.      Dr. Juanda Chance performed the colonoscopy.  10. Gastroesophageal reflux disease.  11. History of peptic ulcer disease and gastritis.  12. Degenerative joint disease.  13. Hypertension.  14. Obesity.  15. Dyslipidemia.  16. Prior history of anemia.  17. History of alcohol abuse.   ALLERGIES:  1.  CODEINE.  2.  MORPHINE.  3.  LIPITOR.   MEDICATIONS ON ADMISSION:  1.  Percocet 10/325 p.r.n.  2.  Hydrochlorothiazide 25 mg one-half daily.  3.  Ambien CR 12.5 daily.  4.  Crestor 20 mg daily.  5.  Xanax p.r.n.  6.  Prozac 20 mg two each day.   SOCIAL HISTORY:  Smoker, one pack per day.  Alcohol:  She apparently drinks  occasionally at this point, but not as heavy as she used to.  With her  friend's death recently, she did drink some.   FAMILY HISTORY:  Noncontributory.   REVIEW OF SYSTEMS:  Positive for those things mentioned above.  There is  chronic back pain.  There is insomnia.  There is no skin  rash reported.  She  wears eyeglasses.  All other systems are negative at this time.   PHYSICAL EXAMINATION:  GENERAL:  Reveals an obese pleasant white woman in no  acute distress.  She is mildly anxious in my judgment.  VITAL SIGNS:  Blood pressure 157/84, pulse 81, respirations 18, temperature  97.8.  HEENT:  Eyes anicteric, mouth free of lesions, mucosa moist.  NECK:  Supple. No mass or thyromegaly.  CHEST:  Clear.  HEART:  S1, S2.  No murmurs or gallops.  ABDOMEN:  Obese, soft, nontender, without organomegaly or mass.  EXTREMITIES:  No edema.  SKIN:  No rash in the areas inspected.  LYMPH NODES:  No neck or supraclavicular nodes.   LABORATORY DATA:  So far, CBC, CMET normal except for white count 11.6,  lipase 24, amylase 36.  TSH, T4, T3 pending.  BMET today is normal except  for a potassium of 3.3.  Urine culture negative.  LFTs are normal.  Her  CT scan demonstrated some fatty liver.  She has old scarring compatible with  granulomatous disease; otherwise, unremarkable.   Appreciate the opportunity to care for this patient.      Iva Boop, M.D. [facility]  Electronically Signed     CEG/MEDQ  D:  06/17/2005  T:  06/19/2005  Job:  [id number]   cc:   Corwin Levins, M.D. [facility]  [address]  [place]  [place] [address]

## 2010-07-14 NOTE — Op Note (Signed)
   [name], [name]                           ACCOUNT NO.:  [id number]   MEDICAL RECORD NO.:  [id number]                   PATIENT TYPE:  AMB   LOCATION:  DFTL                                 [facility]ACILITY:  [facility]   PHYSICIAN:  Patricia Nettle, M.D.                  DATE OF BIRTH:  1945/06/29   DATE OF PROCEDURE:  09/24/2001  DATE OF DISCHARGE:  09/24/2001                                 OPERATIVE REPORT   PREOPERATIVE DIAGNOSIS:  L4-5 adjacent segment degeneration with  spondylolisthesis and lateral recess stenosis.   PROCEDURE:  Aborted secondary to difficult airway.   SURGEON:  Patricia Nettle, M.D.   INDICATIONS FOR PROCEDURE:  The patient is a 66 year old female with a  history of lumbar fusion at L5-S1.  She comes in today for decompression and  fusion at the L4-5 level for symptomatic segment degeneration.  Unfortunately, she was a difficult airway and after multiple attempts at  intubation by the anesthesia department, it was decided that she should be  discharged home and rescheduled for a later date.  At that point, anesthesia  would flag her as a difficult airway and do a fiberoptic intubation and  pretreat her with epinephrine.  She will be discharged home from the  recovery room when she is stable per anesthesia.                                               Patricia Nettle, M.D.    MWC/MEDQ  D:  09/24/2001  T:  09/29/2001  Job:  [id number]

## 2010-07-14 NOTE — H&P (Signed)
[name], [name]                  ACCOUNT NO.:  [id number]   MEDICAL RECORD NO.:  [id number]          PATIENT TYPE:  INP   LOCATION:  NA                           FACILITY:  [facility]   PHYSICIAN:  Corwin Levins, M.D. LHCDATE OF BIRTH:  10-20-1945   DATE OF ADMISSION:  06/15/2005  DATE OF DISCHARGE:                                HISTORY & PHYSICAL   CHIEF COMPLAINT:  Nausea and vomiting, abdominal pain, questionable fever,  unable to take p.o.   HISTORY OF PRESENT ILLNESS:  [name] is a 65 year old white female who has  documented a 26 pound weight loss in 4 weeks.  Symptoms seemed to start  approximately 3 weeks ago, gradually worse since then.  She has had  recurrent nausea and vomiting since early April and thinking she had the  flu and has just been trying to put up with it.  She has an occasional  loose stool last week but no blood.  A friend also died last week.  She  attended the funeral 2 days ago and yesterday had increase in fever, chills,  fatigue, malaise, nausea and vomiting and difficulty to keep anything p.o.  down including medications, liquids or foods.  She is mildly dizzy, here in  the office very tearful, with abdominal pain.  She was keeping her Percocet  down yesterday for chronic pain.   PAST MEDICAL HISTORY:  1.  Chronic low back pain and C-spine disk disease.  2.  Anxiety/depression.  3.  Migraine.  4.  Insomnia.  5.  Chronic fatigue.  6.  History of diverticulosis/diverticulitis per patient's report, right      sided distal colon.  7.  GERD.  8.  History of peptic ulcer disease with gastritis.  9.  DJD.  10. Hypertension.  11. Hypercholesterolemia.  12. History of anemia.  13. History of alcohol abuse.   SURGICAL HISTORY:  Multiple lumbar surgeries.   ALLERGIES:  CODEINE, MORPHINE, LIPITOR.   CURRENT MEDICATIONS:  1.  Percocet 10/325 p.r.n. low back pain per Dr. Richardson Dopp in orthopedics.  2.  Hydrochlorothiazide 25 mg 1/2 p.o. daily.  3.  Ambien CR  12.5 daily.  4.  Crestor 20 mg, unclear if she is really taking.  5.  Elavil 150 mg q.h.s.  6.  Xanax p.r.n.  7.  Prozac 20 mg 2 p.o. daily.   SOCIAL HISTORY:  Tobacco 1 pack per day, alcohol none.   FAMILY HISTORY:  Noncontributory.  >   PHYSICAL EXAMINATION:  [name] is a 65 year old white female, blood  pressure 182/101, pulse 113, temperature 97.3, weight 176.  HEENT:  Sclerae clear, TMs clear, pharynx benign.  NECK:  No lymphadenopathy, JVD, splenomegaly.  CHEST:  No rales.  CARDIOVASCULAR:  Regular rate and rhythm.  ABDOMEN:  Soft, somewhat decreased bowel sounds, moderate epigastric  tenderness but no guarding, rebound, no distention.  EXTREMITIES:  No edema.  NEUROLOGICAL:  Cranial nerves II-XII intact.  Neuro is nonfocal.  PSYCHIATRIC:  Tearful and distraught over situation this morning.   ASSESSMENT AND PLAN:  1.  Nausea and vomiting with  abdominal pain and questionable fever, now      currently unable to take p.o. medications, liquid or food.  2.  Mild dehydration.  She is to be admitted, made n.p.o., given IV fluids,      start Phenergan p.r.n., Protonix IV and given empiric IV antibiotics.      Differential includes diverticulitis and gastric ulcer with outlet      obstruction.  We will check CT of the abdomen and pelvis as well as      routine labs and cultures, consider GI consult.  3.  Hypertension.  Catapres patch for now, hold p.o. medications.  4.  Psychiatric.  Lorazepam IV for now, restart p.o. medications as is able.           ______________________________  Corwin Levins, M.D. [facility]     JWJ/MEDQ  D:  06/15/2005  T:  06/15/2005  Job:  [id number]

## 2010-11-22 LAB — COMPREHENSIVE METABOLIC PANEL
ALT: 12
ALT: 13
AST: 19
Albumin: 4.1
Albumin: 3.9
Alkaline Phosphatase: 80
Alkaline Phosphatase: 67
BUN: 7
CO2: 24
Chloride: 101
Chloride: 107
Creatinine, Ser: 0.7
GFR calc Af Amer: >60
GFR calc non Af Amer: >60
Glucose, Bld: 106 — ABNORMAL HIGH
Potassium: 3.9
Potassium: 4.4
Sodium: 134 — ABNORMAL LOW
Sodium: 142
Total Bilirubin: 0.6
Total Bilirubin: 0.7

## 2010-11-22 LAB — RAPID URINE DRUG SCREEN, HOSP PERFORMED
Amphetamines: NOT DETECTED
Opiates: POSITIVE — AB
Tetrahydrocannabinol: NOT DETECTED

## 2010-11-22 LAB — DIFFERENTIAL
Basophils Absolute: 0.1
Basophils Absolute: 0
Basophils Relative: 1
Eosinophils Absolute: 0.1
Eosinophils Absolute: 0.1
Eosinophils Relative: 1
Lymphocytes Relative: 32
Monocytes Absolute: 0.4
Monocytes Absolute: 0.4
Monocytes Relative: 4
Neutro Abs: 7

## 2010-11-22 LAB — CBC
HCT: 39.7
Hemoglobin: 13.5
MCV: 99.9
Platelets: 191
Platelets: 236
RBC: 3.81 — ABNORMAL LOW
WBC: 11 — ABNORMAL HIGH
WBC: 9.6

## 2010-11-22 LAB — PROTIME-INR: Prothrombin Time: 12.9

## 2010-11-22 LAB — SALICYLATE LEVEL: Salicylate Lvl: <4

## 2010-11-22 LAB — URINALYSIS, ROUTINE W REFLEX MICROSCOPIC
Glucose, UA: NEGATIVE
Hgb urine dipstick: NEGATIVE
Protein, ur: NEGATIVE
Specific Gravity, Urine: 1.005
pH: 5

## 2010-11-22 LAB — ACETAMINOPHEN LEVEL
Acetaminophen (Tylenol), Serum: <10 — ABNORMAL LOW
Acetaminophen (Tylenol), Serum: <10 — ABNORMAL LOW

## 2010-11-22 LAB — ETHANOL: Alcohol, Ethyl (B): <5

## 2010-11-24 LAB — URINALYSIS, ROUTINE W REFLEX MICROSCOPIC
Hgb urine dipstick: NEGATIVE
Nitrite: NEGATIVE
Specific Gravity, Urine: 1.03
Urobilinogen, UA: 0.2
pH: 5

## 2010-11-24 LAB — DIFFERENTIAL
Eosinophils Absolute: 0
Lymphocytes Relative: 33
Lymphs Abs: 3
Neutro Abs: 5.6
Neutrophils Relative %: 62

## 2010-11-24 LAB — RAPID URINE DRUG SCREEN, HOSP PERFORMED
Barbiturates: NOT DETECTED
Cocaine: NOT DETECTED
Opiates: POSITIVE — AB

## 2010-11-24 LAB — CBC
Hemoglobin: 13.6
MCHC: 33.9
MCV: 103.5 — ABNORMAL HIGH
RBC: 3.87
RDW: 14

## 2010-11-24 LAB — COMPREHENSIVE METABOLIC PANEL
BUN: 12
CO2: 22
Calcium: 9.8
Creatinine, Ser: 0.71
GFR calc non Af Amer: >60
Glucose, Bld: 93
Total Protein: 7.7

## 2010-11-27 LAB — COMPREHENSIVE METABOLIC PANEL
Albumin: 4.1
Alkaline Phosphatase: 69
BUN: 12
CO2: 28
Chloride: 105
Potassium: 3.7
Total Bilirubin: 0.6

## 2010-11-27 LAB — URINALYSIS, ROUTINE W REFLEX MICROSCOPIC
Hgb urine dipstick: NEGATIVE
Nitrite: NEGATIVE
Specific Gravity, Urine: 1.014
Urobilinogen, UA: 0.2

## 2010-11-27 LAB — DIFFERENTIAL
Basophils Relative: 1
Eosinophils Relative: 1
Monocytes Absolute: 0.5
Neutro Abs: 8.3 — ABNORMAL HIGH

## 2010-11-27 LAB — CBC
HCT: 35.9 — ABNORMAL LOW
Hemoglobin: 12.1
Platelets: 242
RBC: 3.43 — ABNORMAL LOW
WBC: 11.4 — ABNORMAL HIGH

## 2010-11-27 LAB — DRUG SCREEN PANEL (SERUM)
Barbiturate Scrn: NEGATIVE
Benzodiazepine Scrn: NEGATIVE
Cocaine (Metabolite): NEGATIVE

## 2010-11-27 LAB — LIPASE, BLOOD: Lipase: 14

## 2010-11-27 LAB — GLUCOSE, CAPILLARY

## 2010-11-27 LAB — ACETAMINOPHEN LEVEL: Acetaminophen (Tylenol), Serum: <10 — ABNORMAL LOW

## 2010-12-07 LAB — DIFFERENTIAL
Basophils Absolute: 0
Basophils Relative: 0
Eosinophils Relative: 0
Lymphocytes Relative: 15
Monocytes Absolute: 0.3

## 2010-12-07 LAB — URINALYSIS, ROUTINE W REFLEX MICROSCOPIC
Glucose, UA: NEGATIVE
Hgb urine dipstick: NEGATIVE
Specific Gravity, Urine: 1.022
pH: 5.5

## 2010-12-07 LAB — BASIC METABOLIC PANEL
BUN: 5 — ABNORMAL LOW
CO2: 27
Calcium: 8.5
Calcium: 8.5
Chloride: 106
Chloride: 102
Creatinine, Ser: 0.69
GFR calc Af Amer: >60
GFR calc Af Amer: >60
GFR calc Af Amer: >60
GFR calc non Af Amer: >60
GFR calc non Af Amer: >60
Potassium: 3.8
Sodium: 139

## 2010-12-07 LAB — CBC
HCT: 38.8
HCT: 43.5
Hemoglobin: 12
MCHC: 34.2
MCV: 98.8
MCV: 99
MCV: 99.6
Platelets: 192
Platelets: 205
RBC: 3.55 — ABNORMAL LOW
RBC: 3.92
RBC: 4.41
WBC: 7.3
WBC: 10.8 — ABNORMAL HIGH
WBC: 9.7

## 2010-12-07 LAB — COMPREHENSIVE METABOLIC PANEL
AST: 22
AST: 29
Albumin: 3.7
Alkaline Phosphatase: 77
Alkaline Phosphatase: 89
BUN: 11
BUN: 5 — ABNORMAL LOW
CO2: 27
CO2: 29
Chloride: 97
Chloride: 98
Creatinine, Ser: 0.57
Creatinine, Ser: 0.61
GFR calc Af Amer: >60
GFR calc non Af Amer: >60
GFR calc non Af Amer: >60
Potassium: 3.8
Potassium: 3.8
Total Bilirubin: 0.9
Total Bilirubin: 1

## 2010-12-07 LAB — URINE CULTURE
Colony Count: NO GROWTH
Culture: NO GROWTH

## 2010-12-07 LAB — CLOSTRIDIUM DIFFICILE EIA

## 2010-12-07 LAB — AMYLASE: Amylase: 129

## 2010-12-07 LAB — MAGNESIUM: Magnesium: 2.1

## 2010-12-07 LAB — LIPASE, BLOOD
Lipase: 192 — ABNORMAL HIGH
Lipase: 266 — ABNORMAL HIGH

## 2010-12-07 LAB — RAPID URINE DRUG SCREEN, HOSP PERFORMED
Cocaine: NOT DETECTED
Tetrahydrocannabinol: NOT DETECTED

## 2010-12-07 LAB — POCT CARDIAC MARKERS: CKMB, poc: 1.9

## 2011-01-02 ENCOUNTER — Encounter: Payer: Self-pay | Admitting: Internal Medicine

## 2011-01-15 ENCOUNTER — Ambulatory Visit (INDEPENDENT_AMBULATORY_CARE_PROVIDER_SITE_OTHER): Payer: Medicare Other | Admitting: Internal Medicine

## 2011-01-15 ENCOUNTER — Other Ambulatory Visit (INDEPENDENT_AMBULATORY_CARE_PROVIDER_SITE_OTHER): Payer: Medicare Other

## 2011-01-15 ENCOUNTER — Encounter: Payer: Self-pay | Admitting: Internal Medicine

## 2011-01-15 VITALS — BP 122/88 | HR 75 | Temp 98.2°F | Ht 65.5 in | Wt 156.0 lb

## 2011-01-15 DIAGNOSIS — Z0000 Encounter for general adult medical examination without abnormal findings: Secondary | ICD-10-CM

## 2011-01-15 DIAGNOSIS — E785 Hyperlipidemia, unspecified: Secondary | ICD-10-CM

## 2011-01-15 DIAGNOSIS — Z23 Encounter for immunization: Secondary | ICD-10-CM

## 2011-01-15 LAB — BASIC METABOLIC PANEL
CO2: 23 mEq/L (ref 19–32)
Calcium: 9.4 mg/dL (ref 8.4–10.5)
GFR: 87.21 mL/min (ref >60.00)
Sodium: 140 mEq/L (ref 135–145)

## 2011-01-15 LAB — LIPID PANEL
HDL: 45.4 mg/dL (ref >39.00)
Total CHOL/HDL Ratio: 3
VLDL: 27.6 mg/dL (ref 0.0–40.0)

## 2011-01-15 LAB — CBC WITH DIFFERENTIAL/PLATELET
Basophils Absolute: 0.1 10*3/uL (ref 0.0–0.1)
Eosinophils Absolute: 0 10*3/uL (ref 0.0–0.7)
HCT: 46.2 % — ABNORMAL HIGH (ref 36.0–46.0)
Hemoglobin: 15.6 g/dL — ABNORMAL HIGH (ref 12.0–15.0)
Lymphs Abs: 2.7 10*3/uL (ref 0.7–4.0)
MCHC: 33.8 g/dL (ref 30.0–36.0)
MCV: 94.2 fl (ref 78.0–100.0)
Neutro Abs: 11.4 10*3/uL — ABNORMAL HIGH (ref 1.4–7.7)
RDW: 13.7 % (ref 11.5–14.6)

## 2011-01-15 LAB — HEPATIC FUNCTION PANEL
Alkaline Phosphatase: 88 U/L (ref 39–117)
Bilirubin, Direct: 0.1 mg/dL (ref 0.0–0.3)

## 2011-01-15 LAB — URINALYSIS, ROUTINE W REFLEX MICROSCOPIC
Hgb urine dipstick: NEGATIVE
Ketones, ur: NEGATIVE
Urine Glucose: NEGATIVE
Urobilinogen, UA: 0.2 (ref 0.0–1.0)

## 2011-01-15 MED ORDER — SIMVASTATIN 40 MG PO TABS: 40.0000 mg | ORAL_TABLET | Freq: Every day | ORAL | Status: DC

## 2011-01-15 NOTE — Patient Instructions (Addendum)
You had the flu shot today Continue all other medications as before, but we may need to change the simvastatin to lipitor, and keep taking the Aspirin 81 mg per day (the simvastatin was sent to [facility] though today) Please go to LAB in the Basement for the blood and/or urine tests to be done today Please call the phone number [phone] (the PhoneTree System) for results of testing in 2-3 days;  When calling, simply dial the number, and when prompted enter the MRN number above (the Medical Record Number) and the # key, then the message should start. Please remember to followup with your GYN for the yearly pap smear and/or mammogram - consider [facility] You will be contacted regarding the referral for: colonoscopy Please stop smoking Please return in 1 year for your yearly visit, or sooner if needed, with Lab testing done 3-5 days before

## 2011-01-15 NOTE — Assessment & Plan Note (Addendum)
Overall doing well, age appropriate education and counseling updated, referrals for preventative services and immunizations addressed, dietary and smoking counseling addressed, most recent labs and ECG reviewed.  I have personally reviewed and have noted: 1) the patient's medical and social history 2) The pt's use of alcohol, tobacco, and illicit drugs 3) The patient's current medications and supplements 4) Functional ability including ADL's, fall risk, home safety risk, hearing and visual impairment 5) Diet and physical activities 6) Evidence for depression or mood disorder 7) The patient's height, weight, and BMI have been recorded in the chart I have made referrals, and provided counseling and education based on review of the above ECG reviewed as per emr Also due for colonoscopy

## 2011-01-17 ENCOUNTER — Telehealth: Payer: Self-pay

## 2011-01-17 DIAGNOSIS — D72829 Elevated white blood cell count, unspecified: Secondary | ICD-10-CM

## 2011-01-17 NOTE — Telephone Encounter (Signed)
Put order in for lab. 

## 2011-01-21 ENCOUNTER — Encounter: Payer: Self-pay | Admitting: Internal Medicine

## 2011-01-21 NOTE — Progress Notes (Signed)
Subjective:    Patient ID: [name], female    DOB: 09/21/1945, 65 y.o.   MRN: [id number]  HPI  Here for wellness and f/u;  Overall doing ok;  Pt denies CP, worsening SOB, DOE, wheezing, orthopnea, PND, worsening LE edema, palpitations, dizziness or syncope.  Pt denies neurological change such as new Headache, facial or extremity weakness.  Pt denies polydipsia, polyuria, or low sugar symptoms. Pt states overall good compliance with treatment and medications, good tolerability, and trying to follow lower cholesterol diet.  Pt denies worsening depressive symptoms, suicidal ideation or panic. No fever, wt loss, night sweats, loss of appetite, or other constitutional symptoms.  Pt states good ability with ADL's, low fall risk, home safety reviewed and adequate, no significant changes in hearing or vision, and occasionally active with exercise. Past Medical History  Diagnosis Date  . ALCOHOL ABUSE, HX OF 10/25/2006  . ANEMIA-NOS 01/14/2007  . ANXIETY 09/19/2006  . CERVICAL RADICULOPATHY, LEFT 10/12/2008  . DEGENERATIVE DISC DISEASE, CERVICAL SPINE, W/RADICULOPATHY 07/21/2008  . DEGENERATIVE DISC DISEASE, CERVICAL SPINE 01/14/2007  . Depressive disorder, not elsewhere classified 07/16/2007  . 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 DIVERTICULITIS, HX OF 10/25/2006  . DIZZINESS 09/29/2008  . GERD 09/19/2006  . HYPERLIPIDEMIA 10/25/2006  . HYPERTENSION 01/14/2007  . Insomnia, unspecified 10/25/2006  . LOW BACK PAIN 01/14/2007  . Memory loss 01/09/2008  . MIGRAINE HEADACHE 10/25/2006  . WEIGHT LOSS 06/09/2009  . Palpitation    Past Surgical History  Procedure Date  . Lumbar fusion     reports that she has been smoking.  She does not have any smokeless tobacco history on file. She reports that she does not drink alcohol or use illicit drugs. family history includes Alcohol abuse in her other and Depression in her other. Allergies  Allergen Reactions  . Atorvastatin   . Codeine   . Morphine     Current Outpatient Prescriptions on File Prior to Visit  Medication Sig Dispense Refill  . FLUoxetine (PROZAC) 20 MG tablet Take 20 mg by mouth daily.        [name] aspirin EC 81 MG tablet Take 81 mg by mouth daily.         Review of Systems Review of Systems  Constitutional: Negative for diaphoresis, activity change, appetite change and unexpected weight change.  HENT: Negative for hearing loss, ear pain, facial swelling, mouth sores and neck stiffness.   Eyes: Negative for pain, redness and visual disturbance.  Respiratory: Negative for shortness of breath and wheezing.   Cardiovascular: Negative for chest pain and palpitations.  Gastrointestinal: Negative for diarrhea, blood in stool, abdominal distention and rectal pain.  Genitourinary: Negative for hematuria, flank pain and decreased urine volume.  Musculoskeletal: Negative for myalgias and joint swelling.  Skin: Negative for color change and wound.  Neurological: Negative for syncope and numbness.  Hematological: Negative for adenopathy.  Psychiatric/Behavioral: Negative for hallucinations, Goldring-injury, decreased concentration and agitation.      Objective:   Physical Exam BP 122/88  Pulse 75  Temp(Src) 98.2 F (36.8 C) (Oral)  Ht 5' 5.5" (1.664 m)  Wt 156 lb (70.761 kg)  BMI 25.56 kg/m2  SpO2 97% Physical Exam  VS noted Constitutional: Pt is oriented to person, place, and time. Appears well-developed and well-nourished.  HENT:  Head: Normocephalic and atraumatic.  Right Ear: External ear normal.  Left Ear: External ear normal.  Nose: Nose normal.  Mouth/Throat: Oropharynx is clear and moist.  Eyes: Conjunctivae and EOM are normal.  Pupils are equal, round, and reactive to light.  Neck: Normal range of motion. Neck supple. No JVD present. No tracheal deviation present.  Cardiovascular: Normal rate, regular rhythm, normal heart sounds and intact distal pulses.   Pulmonary/Chest: Effort normal and breath sounds normal.   Abdominal: Soft. Bowel sounds are normal. There is no tenderness.  Musculoskeletal: Normal range of motion. Exhibits no edema.  Lymphadenopathy:  Has no cervical adenopathy.  Neurological: Pt is alert and oriented to person, place, and time. Pt has normal reflexes. No cranial nerve deficit.  Skin: Skin is warm and dry. No rash noted.  Psychiatric:  Has  normal mood and affect. Behavior is normal. 1+ nervous    Assessment & Plan:

## 2011-01-21 NOTE — Assessment & Plan Note (Addendum)
stable overall by hx and exam, most recent data reviewed with pt, and pt to continue medical treatment as before, consider change to lipitor  Lab Results  Component Value Date   LDLCALC 84 01/15/2011

## 2011-01-22 ENCOUNTER — Other Ambulatory Visit (INDEPENDENT_AMBULATORY_CARE_PROVIDER_SITE_OTHER): Payer: Medicare Other

## 2011-01-22 DIAGNOSIS — D72829 Elevated white blood cell count, unspecified: Secondary | ICD-10-CM

## 2011-01-22 LAB — CBC WITH DIFFERENTIAL/PLATELET
Basophils Relative: 0.3 % (ref 0.0–3.0)
Eosinophils Absolute: 0 10*3/uL (ref 0.0–0.7)
Eosinophils Relative: 0.3 % (ref 0.0–5.0)
Hemoglobin: 15.8 g/dL — ABNORMAL HIGH (ref 12.0–15.0)
Lymphocytes Relative: 23.1 % (ref 12.0–46.0)
MCHC: 34.1 g/dL (ref 30.0–36.0)
MCV: 93.3 fl (ref 78.0–100.0)
Neutro Abs: 8.1 10*3/uL — ABNORMAL HIGH (ref 1.4–7.7)
RBC: 4.96 Mil/uL (ref 3.87–5.11)

## 2011-04-24 ENCOUNTER — Ambulatory Visit: Payer: Medicare Other | Admitting: Internal Medicine

## 2011-04-24 DIAGNOSIS — Z0289 Encounter for other administrative examinations: Secondary | ICD-10-CM

## 2011-05-10 ENCOUNTER — Telehealth: Payer: Self-pay

## 2011-05-10 NOTE — Telephone Encounter (Signed)
I reviewed the notes  I would still need the form mentioned to fill out

## 2011-05-10 NOTE — Telephone Encounter (Signed)
Sorry, I dont believe I have seen this, nor know how to obtain it

## 2011-05-10 NOTE — Telephone Encounter (Signed)
Jasmine December [facility], patients daughter) is requesting the patients PCP complete a 3068 form to have the patient placed in a memory care unit. The patient is at home for now, but social services informing the patient needs to be placed in a memory care unit asap.  The patients daughter would like to know if JWJ has received paper work from [employer]? Please advise if the patient needs OV or if form can be dropped off by the office to be completed. Sharon's call back number is [phone].

## 2011-05-10 NOTE — Telephone Encounter (Signed)
Notes have been faxed over and are on MD's desk.

## 2011-05-11 NOTE — Telephone Encounter (Signed)
Called Jasmine December and she will drop the form off this AM to be completed asap and she will pickup once completed,

## 2011-05-14 ENCOUNTER — Telehealth: Payer: Self-pay

## 2011-05-14 NOTE — Telephone Encounter (Signed)
Called Jasmine December to inform that form has been completed. I faxed to [phone] made copy for patients chart and Jasmine December will pickup original at front desk this AM.

## 2012-08-18 ENCOUNTER — Ambulatory Visit: Payer: Self-pay | Admitting: Orthopaedic Surgery

## 2013-08-18 ENCOUNTER — Encounter: Payer: Self-pay | Admitting: Internal Medicine

## 2013-10-08 ENCOUNTER — Encounter: Payer: Self-pay | Admitting: *Deleted

## 2013-10-28 ENCOUNTER — Encounter: Payer: Self-pay | Admitting: Internal Medicine

## 2014-07-20 ENCOUNTER — Other Ambulatory Visit: Payer: Self-pay | Admitting: Orthopaedic Surgery

## 2014-07-20 DIAGNOSIS — M542 Cervicalgia: Secondary | ICD-10-CM

## 2014-08-16 ENCOUNTER — Ambulatory Visit
Admission: RE | Admit: 2014-08-16 | Discharge: 2014-08-16 | Disposition: A | Discharge status: Home / Self Care | Payer: Medicare Other | Source: Ambulatory Visit | Attending: Orthopaedic Surgery | Admitting: Orthopaedic Surgery

## 2014-08-16 DIAGNOSIS — M542 Cervicalgia: Secondary | ICD-10-CM

## 2014-11-18 ENCOUNTER — Ambulatory Visit: Admission: RE | Admit: 2014-11-18 | Payer: Medicare Other | Source: Ambulatory Visit | Admitting: Gastroenterology

## 2014-11-18 ENCOUNTER — Encounter: Admission: RE | Payer: Self-pay | Source: Ambulatory Visit

## 2014-11-18 SURGERY — ESOPHAGOGASTRODUODENOSCOPY (EGD) WITH PROPOFOL
Anesthesia: General

## 2014-12-13 ENCOUNTER — Encounter: Admission: RE | Payer: Self-pay | Source: Ambulatory Visit

## 2014-12-13 ENCOUNTER — Ambulatory Visit: Admission: RE | Admit: 2014-12-13 | Payer: Medicare Other | Source: Ambulatory Visit | Admitting: Gastroenterology

## 2014-12-13 SURGERY — ESOPHAGOGASTRODUODENOSCOPY (EGD) WITH PROPOFOL
Anesthesia: General

## 2015-01-03 ENCOUNTER — Encounter
Admission: RE | Disposition: A | Discharge status: Home Health | Payer: Self-pay | Source: Ambulatory Visit | Attending: Gastroenterology

## 2015-01-03 ENCOUNTER — Encounter: Payer: Self-pay | Admitting: Anesthesiology

## 2015-01-03 ENCOUNTER — Ambulatory Visit: Payer: Medicare Other | Admitting: Certified Registered"

## 2015-01-03 ENCOUNTER — Ambulatory Visit
Admission: RE | Admit: 2015-01-03 | Discharge: 2015-01-03 | Disposition: A | Discharge status: Home Health | Payer: Medicare Other | Source: Ambulatory Visit | Attending: Gastroenterology | Admitting: Gastroenterology

## 2015-01-03 DIAGNOSIS — Z818 Family history of other mental and behavioral disorders: Secondary | ICD-10-CM | POA: Diagnosis not present

## 2015-01-03 DIAGNOSIS — M5030 Other cervical disc degeneration, unspecified cervical region: Secondary | ICD-10-CM | POA: Insufficient documentation

## 2015-01-03 DIAGNOSIS — D649 Anemia, unspecified: Secondary | ICD-10-CM | POA: Insufficient documentation

## 2015-01-03 DIAGNOSIS — E785 Hyperlipidemia, unspecified: Secondary | ICD-10-CM | POA: Diagnosis not present

## 2015-01-03 DIAGNOSIS — M5412 Radiculopathy, cervical region: Secondary | ICD-10-CM | POA: Insufficient documentation

## 2015-01-03 DIAGNOSIS — Z7982 Long term (current) use of aspirin: Secondary | ICD-10-CM | POA: Insufficient documentation

## 2015-01-03 DIAGNOSIS — F329 Major depressive disorder, single episode, unspecified: Secondary | ICD-10-CM | POA: Insufficient documentation

## 2015-01-03 DIAGNOSIS — I10 Essential (primary) hypertension: Secondary | ICD-10-CM | POA: Diagnosis not present

## 2015-01-03 DIAGNOSIS — Z885 Allergy status to narcotic agent status: Secondary | ICD-10-CM | POA: Diagnosis not present

## 2015-01-03 DIAGNOSIS — R42 Dizziness and giddiness: Secondary | ICD-10-CM | POA: Diagnosis not present

## 2015-01-03 DIAGNOSIS — R1012 Left upper quadrant pain: Secondary | ICD-10-CM | POA: Insufficient documentation

## 2015-01-03 DIAGNOSIS — Z981 Arthrodesis status: Secondary | ICD-10-CM | POA: Diagnosis not present

## 2015-01-03 DIAGNOSIS — G4700 Insomnia, unspecified: Secondary | ICD-10-CM | POA: Insufficient documentation

## 2015-01-03 DIAGNOSIS — K3189 Other diseases of stomach and duodenum: Secondary | ICD-10-CM | POA: Diagnosis not present

## 2015-01-03 DIAGNOSIS — F17200 Nicotine dependence, unspecified, uncomplicated: Secondary | ICD-10-CM | POA: Insufficient documentation

## 2015-01-03 DIAGNOSIS — M545 Low back pain: Secondary | ICD-10-CM | POA: Diagnosis not present

## 2015-01-03 DIAGNOSIS — K219 Gastro-esophageal reflux disease without esophagitis: Secondary | ICD-10-CM | POA: Insufficient documentation

## 2015-01-03 DIAGNOSIS — G43909 Migraine, unspecified, not intractable, without status migrainosus: Secondary | ICD-10-CM | POA: Diagnosis not present

## 2015-01-03 DIAGNOSIS — K2950 Unspecified chronic gastritis without bleeding: Secondary | ICD-10-CM | POA: Insufficient documentation

## 2015-01-03 DIAGNOSIS — Z8711 Personal history of peptic ulcer disease: Secondary | ICD-10-CM | POA: Insufficient documentation

## 2015-01-03 DIAGNOSIS — Z888 Allergy status to other drugs, medicaments and biological substances status: Secondary | ICD-10-CM | POA: Insufficient documentation

## 2015-01-03 DIAGNOSIS — Z811 Family history of alcohol abuse and dependence: Secondary | ICD-10-CM | POA: Insufficient documentation

## 2015-01-03 DIAGNOSIS — R002 Palpitations: Secondary | ICD-10-CM | POA: Insufficient documentation

## 2015-01-03 DIAGNOSIS — Z79899 Other long term (current) drug therapy: Secondary | ICD-10-CM | POA: Diagnosis not present

## 2015-01-03 DIAGNOSIS — R634 Abnormal weight loss: Secondary | ICD-10-CM | POA: Diagnosis not present

## 2015-01-03 DIAGNOSIS — Z6832 Body mass index (BMI) 32.0-32.9, adult: Secondary | ICD-10-CM | POA: Insufficient documentation

## 2015-01-03 HISTORY — PX: ESOPHAGOGASTRODUODENOSCOPY: SHX5428

## 2015-01-03 SURGERY — EGD (ESOPHAGOGASTRODUODENOSCOPY)
Anesthesia: General

## 2015-01-03 MED ORDER — GLYCOPYRROLATE 0.2 MG/ML IJ SOLN
INTRAMUSCULAR | Status: DC | PRN
  Administered 2015-01-03: 0.2 mg via INTRAVENOUS

## 2015-01-03 MED ORDER — SODIUM CHLORIDE 0.9 % IV SOLN
INTRAVENOUS | Status: DC
  Administered 2015-01-03: 1000 mL via INTRAVENOUS

## 2015-01-03 MED ORDER — LIDOCAINE HCL (CARDIAC) 20 MG/ML IV SOLN
INTRAVENOUS | Status: DC | PRN
  Administered 2015-01-03: 50 mg via INTRAVENOUS

## 2015-01-03 MED ORDER — LIDOCAINE HCL (PF) 1 % IJ SOLN: 2.0000 mL | Freq: Once | INTRAMUSCULAR | Status: DC

## 2015-01-03 MED ORDER — PROPOFOL 10 MG/ML IV BOLUS
INTRAVENOUS | Status: DC | PRN
  Administered 2015-01-03: 30 mg via INTRAVENOUS
  Administered 2015-01-03: 70 mg via INTRAVENOUS

## 2015-01-03 MED ORDER — PROPOFOL 500 MG/50ML IV EMUL
INTRAVENOUS | Status: DC | PRN
  Administered 2015-01-03: 120 ug/kg/min via INTRAVENOUS

## 2015-01-03 MED ORDER — LIDOCAINE HCL (PF) 1 % IJ SOLN
INTRAMUSCULAR | Status: AC
  Filled 2015-01-03: qty 2

## 2015-01-03 NOTE — Transfer of Care (Signed)
Immediate Anesthesia Transfer of Care Note  Patient: Laurie JarvisLinda S [name]  Procedure(s) Performed: Procedure(s): ESOPHAGOGASTRODUODENOSCOPY (EGD) (N/A)  Patient Location: Endoscopy Unit  Anesthesia Type:General  Level of Consciousness: awake  Airway & Oxygen Therapy: Patient Spontanous Breathing and Patient connected to nasal cannula oxygen  Post-op Assessment: Report given to RN  Post vital signs: Reviewed  Last Vitals:  Filed Vitals:   01/03/15 1339  BP: 116/64  Pulse: 88  Temp: 35.9 C  Resp: 18    Complications: No apparent anesthesia complications

## 2015-01-03 NOTE — Op Note (Signed)
[facility] Patient Name: Laurie QuinLinda Derrick Procedure Date: 01/03/2015 1:15 PM MRN: [id number] Account #: [id number] Date of Birth: 08-30-1945 Admit Type: Outpatient Age: 69 [facility] END[facility] ROOM 2 Gender: Female Note Status: Finalized Procedure:         Upper GI endoscopy Indications:       Abdominal pain in the left upper quadrant, Personal                     history of peptic ulcer disease Patient Profile:   This is a 69 year old female. Providers:         Rhona RaiderMatthew G. Shelle Ironein, MD Referring MD:      Royetta AsalAmit Kalra (Referring MD) Medicines:         Propofol per Anesthesia Complications:     No immediate complications. Procedure:         Pre-Anesthesia Assessment:                    - Prior to the procedure, a History and Physical was                     performed, and patient medications, allergies and                     sensitivities were reviewed. The patient's tolerance of                     previous anesthesia was reviewed.                    After obtaining informed consent, the endoscope was passed                     under direct vision. Throughout the procedure, the                     patient's blood pressure, pulse, and oxygen saturations                     were monitored continuously. The Endoscope was introduced                     through the mouth, and advanced to the second part of                     duodenum. The upper GI endoscopy was accomplished without                     difficulty. The patient tolerated the procedure well. Findings:      The Z-line was irregular and was found at the gastroesophageal junction.       Few tongues and islands. C0M1. Biopsies were taken with a cold forceps       for histology to r/o Barrett's or reflux esophagitis.      Diffuse moderate inflammation characterized by erythema was found in the       gastric body and in the gastric antrum. Biopsies were taken with a cold       forceps for histology.      A  likely healed ulcer bed was found in the gastric antrum. Biopsies were       taken with a cold forceps for histology.      A likelyy healed ulcer bed was found in the duodenal bulb. Biopsies were  taken with a cold forceps for histology. Impression:        - Z-line irregular, at the gastroesophageal junction.                     Biopsied.                    - Gastritis. Biopsied.                    - Scar in the gastric antrum. Biopsied.                    - Duodenal scar. Biopsied. Recommendation:    - Observe patient in GI recovery unit.                    - Continue present medications.                    - Await pathology results.                    - Consider CT a/p if pain persists despite protonix pump                     inhibitor.                    - The findings and recommendations were discussed with the                     patient.                    - The findings and recommendations were discussed with the                     patient's family. Procedure Code(s): --- Professional ---                    [phone], Esophagogastroduodenoscopy, flexible, transoral;                     with biopsy, single or multiple CPT copyright 2014 American Medical Association. All rights reserved. The codes documented in this report are preliminary and upon coder review may  be revised to meet current compliance requirements. Kathalene Frames, MD 01/03/2015 1:39:47 PM This report has been signed electronically. Number of Addenda: 0 Note Initiated On: 01/03/2015 1:15 PM      [facility]

## 2015-01-03 NOTE — Anesthesia Preprocedure Evaluation (Signed)
Anesthesia Evaluation  Patient identified by MRN, date of birth, ID band Patient awake    Reviewed: Allergy & Precautions, H&P , NPO status , Patient's Chart, lab work & pertinent test results, reviewed documented beta blocker date and time   History of Anesthesia Complications Negative for: history of anesthetic complications  Airway Mallampati: III  TM Distance: >3 FB Neck ROM: full    Dental no notable dental hx. (+) Poor Dentition   Pulmonary neg shortness of breath, neg sleep apnea, neg COPD, neg recent URI, Current Smoker,    Pulmonary exam normal breath sounds clear to auscultation       Cardiovascular Exercise Tolerance: Good hypertension, (-) angina(-) CAD, (-) Past MI, (-) Cardiac Stents and (-) CABG Normal cardiovascular exam(-) dysrhythmias (-) Valvular Problems/Murmurs Rhythm:regular Rate:Normal     Neuro/Psych neg Headaches, neg Seizures PSYCHIATRIC DISORDERS (depression and anxiety)  Neuromuscular disease (brachial radiculitis and DDD of cervical spine)    GI/Hepatic Neg liver ROS, GERD  Medicated and Poorly Controlled,  Endo/Other  negative endocrine ROS  Renal/GU negative Renal ROS  negative genitourinary   Musculoskeletal   Abdominal   Peds  Hematology  (+) Blood dyscrasia, anemia ,   Anesthesia Other Findings Past Medical History:   ALCOHOL ABUSE, HX OF                                         ANEMIA-NOS                                                   ANXIETY                                                      CERVICAL RADICULOPATHY, LEFT                                 DEGENERATIVE DISC DISEASE, CERVICAL SPINE, W/R*              DEGENERATIVE DISC DISEASE, CERVICAL SPINE                    Depressive disorder, not elsewhere classified                Depressive type psychosis (HCC)                              DIVERTICULITIS, HX OF                                        DIZZINESS                                                     GERD  HYPERLIPIDEMIA                                               HYPERTENSION                                                 Insomnia, unspecified                                        LOW BACK PAIN                                                Memory loss                                                  MIGRAINE HEADACHE                                            WEIGHT LOSS                                                  Palpitation                                                  Reproductive/Obstetrics negative OB ROS                             Anesthesia Physical Anesthesia Plan  ASA: II  Anesthesia Plan: General   Post-op Pain Management:    Induction:   Airway Management Planned:   Additional Equipment:   Intra-op Plan:   Post-operative Plan:   Informed Consent: I have reviewed the patients History and Physical, chart, labs and discussed the procedure including the risks, benefits and alternatives for the proposed anesthesia with the patient or authorized representative who has indicated his/her understanding and acceptance.   Dental Advisory Given  Plan Discussed with: Anesthesiologist, CRNA and Surgeon  Anesthesia Plan Comments:         Anesthesia Quick Evaluation

## 2015-01-03 NOTE — H&P (Signed)
Primary Care Physician:  Oliver BarreJames John, MD  Pre-Procedure History & Physical: HPI:  Laurie JarvisLinda S [name] is a 69 y.o. female is here for an endoscopy.   Past Medical History  Diagnosis Date  . ALCOHOL ABUSE, HX OF   . ANEMIA-NOS   . ANXIETY   . CERVICAL RADICULOPATHY, LEFT   . DEGENERATIVE DISC DISEASE, CERVICAL SPINE, W/RADICULOPATHY   . DEGENERATIVE DISC DISEASE, CERVICAL SPINE   . Depressive disorder, not elsewhere classified   . Depressive type psychosis   . DIVERTICULITIS, HX OF   . DIZZINESS   . GERD   . HYPERLIPIDEMIA   . HYPERTENSION   . Insomnia, unspecified   . LOW BACK PAIN   . Memory loss   . MIGRAINE HEADACHE   . WEIGHT LOSS   . Palpitation     Past Surgical History  Procedure Laterality Date  . Lumbar fusion      Prior to Admission medications   Medication Sig Start Date End Date Taking? Authorizing Provider  aspirin EC 81 MG tablet Take 81 mg by mouth daily.      Historical Provider, MD  buPROPion (WELLBUTRIN SR) 150 MG 12 hr tablet Take 150 mg by mouth daily.      Historical Provider, MD  donepezil (ARICEPT) 5 MG tablet Take 5 mg by mouth daily.      Historical Provider, MD  FLUoxetine (PROZAC) 20 MG tablet Take 20 mg by mouth daily.      Historical Provider, MD  omeprazole (PRILOSEC) 40 MG capsule Take 40 mg by mouth daily.    Historical Provider, MD  QUEtiapine (SEROQUEL) 25 MG tablet 2 by mouth every evening     Historical Provider, MD  simvastatin (ZOCOR) 40 MG tablet Take 1 tablet (40 mg total) by mouth at bedtime. 01/15/11 01/15/12  Corwin LevinsJames W John, MD  traMADol (ULTRAM) 50 MG tablet Take by mouth every 6 (six) hours as needed.    Historical Provider, MD    Allergies as of 12/14/2014 - Review Complete 12/10/2014  Allergen Reaction Noted  . Atorvastatin  09/19/2006  . Codeine  09/19/2006  . Morphine  09/19/2006    Family History  Problem Relation Age of Onset  . Depression Other   . Alcohol abuse Other     Social History   Social History  .  Marital Status: Widowed    Spouse Name: N/A  . Number of Children: N/A  . Years of Education: N/A   Occupational History  . Not on file.   Social History Main Topics  . Smoking status: Current Every Day Smoker  . Smokeless tobacco: Not on file  . Alcohol Use: No  . Drug Use: No  . Sexual Activity: Not on file   Other Topics Concern  . Not on file   Social History Narrative  . No narrative on file     Physical Exam: BP 124/67 mmHg  Pulse 80  Temp(Src) 96.6 F (35.9 C) (Tympanic)  Resp 17  Ht 5\' 6"  (1.676 m)  Wt 90.719 kg (200 lb)  BMI 32.30 kg/m2  SpO2 97% General:   Alert,  pleasant and cooperative in NAD Head:  Normocephalic and atraumatic. Neck:  Supple; no masses or thyromegaly. Lungs:  Clear throughout to auscultation.    Heart:  Regular rate and rhythm. Abdomen:  Soft, nontender and nondistended. Normal bowel sounds, without guarding, and without rebound.   Neurologic:  Alert and  oriented x4;  grossly normal neurologically.  Impression/Plan: Laurie JarvisLinda S [name] is  here for an endoscopy to be performed for LUQ pain, hx PUD  Risks, benefits, limitations, and alternatives regarding  endoscopy have been reviewed with the patient.  Questions have been answered.  All parties agreeable.   Elnita Maxwell, MD  01/03/2015, 1:02 PM

## 2015-01-04 ENCOUNTER — Encounter: Payer: Self-pay | Admitting: Gastroenterology

## 2015-01-04 ENCOUNTER — Other Ambulatory Visit: Payer: Self-pay | Admitting: Gastroenterology

## 2015-01-04 DIAGNOSIS — R1084 Generalized abdominal pain: Secondary | ICD-10-CM

## 2015-01-04 LAB — SURGICAL PATHOLOGY

## 2015-01-04 NOTE — Anesthesia Postprocedure Evaluation (Signed)
  Anesthesia Post-op Note  Patient: Laurie JarvisLinda S Drotar  Procedure(s) Performed: Procedure(s): ESOPHAGOGASTRODUODENOSCOPY (EGD) (N/A)  Anesthesia type:General  Patient location: PACU  Post pain: Pain level controlled  Post assessment: Post-op Vital signs reviewed, Patient's Cardiovascular Status Stable, Respiratory Function Stable, Patent Airway and No signs of Nausea or vomiting  Post vital signs: Reviewed and stable  Last Vitals:  Filed Vitals:   01/03/15 1410  BP: 138/77  Pulse: 84  Temp:   Resp: 11    Level of consciousness: awake, alert  and patient cooperative  Complications: No apparent anesthesia complications

## 2015-01-11 ENCOUNTER — Ambulatory Visit: Admission: RE | Admit: 2015-01-11 | Payer: Medicare Other | Source: Ambulatory Visit

## 2015-02-11 ENCOUNTER — Ambulatory Visit
Admission: RE | Admit: 2015-02-11 | Discharge: 2015-02-11 | Disposition: A | Discharge status: Home / Self Care | Payer: Medicare Other | Source: Ambulatory Visit | Attending: Gastroenterology | Admitting: Gastroenterology

## 2015-02-11 DIAGNOSIS — K760 Fatty (change of) liver, not elsewhere classified: Secondary | ICD-10-CM | POA: Insufficient documentation

## 2015-02-11 DIAGNOSIS — R1084 Generalized abdominal pain: Secondary | ICD-10-CM | POA: Insufficient documentation

## 2015-02-11 DIAGNOSIS — K8020 Calculus of gallbladder without cholecystitis without obstruction: Secondary | ICD-10-CM | POA: Insufficient documentation

## 2015-02-11 MED ORDER — IOHEXOL 300 MG/ML  SOLN
100.0000 mL | Freq: Once | INTRAMUSCULAR | Status: AC | PRN
  Administered 2015-02-11: 100 mL via INTRAVENOUS

## 2015-05-13 ENCOUNTER — Other Ambulatory Visit: Payer: Self-pay | Admitting: Orthopedic Surgery

## 2015-05-13 DIAGNOSIS — M25551 Pain in right hip: Secondary | ICD-10-CM

## 2015-05-13 DIAGNOSIS — M1611 Unilateral primary osteoarthritis, right hip: Secondary | ICD-10-CM

## 2015-05-23 ENCOUNTER — Ambulatory Visit
Admission: RE | Admit: 2015-05-23 | Discharge: 2015-05-23 | Disposition: A | Discharge status: Home / Self Care | Payer: Medicare Other | Source: Ambulatory Visit | Attending: Orthopedic Surgery | Admitting: Orthopedic Surgery

## 2015-05-23 DIAGNOSIS — M899 Disorder of bone, unspecified: Secondary | ICD-10-CM | POA: Diagnosis not present

## 2015-05-23 DIAGNOSIS — M1611 Unilateral primary osteoarthritis, right hip: Secondary | ICD-10-CM | POA: Insufficient documentation

## 2015-05-23 DIAGNOSIS — M25551 Pain in right hip: Secondary | ICD-10-CM

## 2015-07-18 ENCOUNTER — Encounter
Admission: RE | Admit: 2015-07-18 | Discharge: 2015-07-18 | Disposition: A | Discharge status: Home / Self Care | Payer: Medicare Other | Source: Ambulatory Visit | Attending: Orthopedic Surgery | Admitting: Orthopedic Surgery

## 2015-07-18 DIAGNOSIS — I10 Essential (primary) hypertension: Secondary | ICD-10-CM | POA: Diagnosis not present

## 2015-07-18 DIAGNOSIS — D649 Anemia, unspecified: Secondary | ICD-10-CM | POA: Diagnosis not present

## 2015-07-18 DIAGNOSIS — M1611 Unilateral primary osteoarthritis, right hip: Secondary | ICD-10-CM | POA: Diagnosis not present

## 2015-07-18 DIAGNOSIS — Z01810 Encounter for preprocedural cardiovascular examination: Secondary | ICD-10-CM | POA: Diagnosis present

## 2015-07-18 DIAGNOSIS — Z01812 Encounter for preprocedural laboratory examination: Secondary | ICD-10-CM | POA: Diagnosis present

## 2015-07-18 DIAGNOSIS — E785 Hyperlipidemia, unspecified: Secondary | ICD-10-CM | POA: Insufficient documentation

## 2015-07-18 DIAGNOSIS — K219 Gastro-esophageal reflux disease without esophagitis: Secondary | ICD-10-CM | POA: Insufficient documentation

## 2015-07-18 LAB — CBC
HEMATOCRIT: 41.5 % (ref 35.0–47.0)
Hemoglobin: 13.8 g/dL (ref 12.0–16.0)
MCH: 31.8 pg (ref 26.0–34.0)
MCHC: 33.3 g/dL (ref 32.0–36.0)
MCV: 95.6 fL (ref 80.0–100.0)
Platelets: 200 10*3/uL (ref 150–440)
RBC: 4.34 MIL/uL (ref 3.80–5.20)
RDW: 13.5 % (ref 11.5–14.5)
WBC: 7.7 10*3/uL (ref 3.6–11.0)

## 2015-07-18 LAB — URINALYSIS COMPLETE WITH MICROSCOPIC (ARMC ONLY)
BACTERIA UA: NONE SEEN
Bilirubin Urine: NEGATIVE
Glucose, UA: NEGATIVE mg/dL
HGB URINE DIPSTICK: NEGATIVE
Ketones, ur: NEGATIVE mg/dL
LEUKOCYTES UA: NEGATIVE
Nitrite: NEGATIVE
PROTEIN: NEGATIVE mg/dL
SPECIFIC GRAVITY, URINE: 1.017 (ref 1.005–1.030)
pH: 5 (ref 5.0–8.0)

## 2015-07-18 LAB — SURGICAL PCR SCREEN
MRSA, PCR: NEGATIVE
Staphylococcus aureus: NEGATIVE

## 2015-07-18 LAB — BASIC METABOLIC PANEL
ANION GAP: 6 (ref 5–15)
BUN: 15 mg/dL (ref 6–20)
CHLORIDE: 108 mmol/L (ref 101–111)
CO2: 27 mmol/L (ref 22–32)
Calcium: 9.4 mg/dL (ref 8.9–10.3)
Creatinine, Ser: 0.75 mg/dL (ref 0.44–1.00)
GFR calc Af Amer: >60 mL/min (ref >60)
GLUCOSE: 87 mg/dL (ref 65–99)
POTASSIUM: 3.9 mmol/L (ref 3.5–5.1)
Sodium: 141 mmol/L (ref 135–145)

## 2015-07-18 LAB — TYPE AND SCREEN
ABO/RH(D): B POS
ANTIBODY SCREEN: NEGATIVE

## 2015-07-18 LAB — PROTIME-INR
INR: 1.03
Prothrombin Time: 13.7 seconds (ref 11.4–15.0)

## 2015-07-18 LAB — SEDIMENTATION RATE: Sed Rate: 13 mm/hr (ref 0–30)

## 2015-07-18 LAB — APTT: APTT: 28 s (ref 24–36)

## 2015-07-18 NOTE — Pre-Procedure Instructions (Signed)
Dr. Rosita KeaMenz office [facility] and faxed[facility] medical clearance request for abnormal pre-op EKG.

## 2015-07-18 NOTE — Pre-Procedure Instructions (Signed)
Dr. Henrene HawkingKephart made aware of pre-op EKG and medical clearance requested.

## 2015-07-18 NOTE — Patient Instructions (Signed)
  Your procedure is scheduled on: August 04, 2015 (Thursday) Report to Same Day Surgery 2nd floor [facility] To find out your arrival time please call [phone] between 1PM - 3PM on August 03, 2015 (Wednesday)  Remember: Instructions that are not followed completely may result in serious medical risk, up to and including death, or upon the discretion of your surgeon and anesthesiologist your surgery may need to be rescheduled.    _x___ 1. Do not eat food or drink liquids after midnight. No gum chewing or hard candies.     ___ 2. No Alcohol for 24 hours before or after surgery.   ____ 3. Bring all medications with you on the day of surgery if instructed.    __x__ 4. Notify your doctor if there is any change in your medical condition     (cold, fever, infections).     Do not wear jewelry, make-up, hairpins, clips or nail polish.  Do not wear lotions, powders, or perfumes. You may wear deodorant.  Do not shave 48 hours prior to surgery. Men may shave face and neck.  Do not bring valuables to the hospital.    [facility] is not responsible for any belongings or valuables.               Contacts, dentures or bridgework may not be worn into surgery.  Leave your suitcase in the car. After surgery it may be brought to your room.  For patients admitted to the hospital, discharge time is determined by your treatment team.   Patients discharged the day of surgery will not be allowed to drive home.    Please read over the following fact sheets that you were given:   [facility] Preparing for [facility] and or MRSA Information   _x___ Take these medicines the morning of surgery with A SIP OF WATER:    1. Pantoprazole  2.Fluoxetine  3.  4.  5.  6.  ____ Fleet Enema (as directed)   _x___ Use CHG Soap or sage wipes as directed on instruction sheet   ____ Use inhalers on the day of surgery and bring to hospital day of surgery  ____ Stop metformin 2 days prior to surgery    ____ Take 1/2  of usual insulin dose the night before surgery and none on the morning of surgery        .   _x___ Stop aspirin or coumadin, or plavix (N/A)  _x__ Stop Anti-inflammatories such as Advil, Aleve, Ibuprofen, Motrin, Naproxen,          Naprosyn, Goodies powders or aspirin products. Ok to take Tylenol or Tramadol if needed. (STOP MELOXICAM ONE WEEK PRIOR TO SURGERY)   _x__ Stop supplements until after surgery.  (STOP VITAMIN B-12 NOW)  ____ Bring C-Pap to the hospital.

## 2015-07-19 LAB — URINE CULTURE

## 2015-07-19 LAB — ABO/RH: ABO/RH(D): B POS

## 2015-07-21 ENCOUNTER — Encounter: Payer: Self-pay | Admitting: *Deleted

## 2015-07-21 NOTE — Pre-Procedure Instructions (Signed)
[facility] DR ADAMS NOTIFIED AND CLEARANCE REQUESTED 07/18/15/ CNL. NOTIFIED SUZETTE AT [facility]

## 2015-08-04 ENCOUNTER — Inpatient Hospital Stay: Payer: Medicare Other

## 2015-08-04 ENCOUNTER — Inpatient Hospital Stay: Payer: Medicare Other | Admitting: Anesthesiology

## 2015-08-04 ENCOUNTER — Inpatient Hospital Stay
Admission: RE | Admit: 2015-08-04 | Discharge: 2015-08-08 | DRG: 470 | Disposition: A | Discharge status: Skilled Nursing Facility | Payer: Medicare Other | Source: Ambulatory Visit | Attending: Orthopedic Surgery | Admitting: Orthopedic Surgery

## 2015-08-04 ENCOUNTER — Encounter: Payer: Self-pay | Admitting: *Deleted

## 2015-08-04 ENCOUNTER — Encounter
Admission: RE | Disposition: A | Discharge status: Skilled Nursing Facility | Payer: Self-pay | Source: Ambulatory Visit | Attending: Orthopedic Surgery

## 2015-08-04 DIAGNOSIS — Z79899 Other long term (current) drug therapy: Secondary | ICD-10-CM

## 2015-08-04 DIAGNOSIS — K219 Gastro-esophageal reflux disease without esophagitis: Secondary | ICD-10-CM | POA: Diagnosis present

## 2015-08-04 DIAGNOSIS — Z791 Long term (current) use of non-steroidal anti-inflammatories (NSAID): Secondary | ICD-10-CM

## 2015-08-04 DIAGNOSIS — E785 Hyperlipidemia, unspecified: Secondary | ICD-10-CM | POA: Diagnosis present

## 2015-08-04 DIAGNOSIS — F329 Major depressive disorder, single episode, unspecified: Secondary | ICD-10-CM | POA: Diagnosis present

## 2015-08-04 DIAGNOSIS — I10 Essential (primary) hypertension: Secondary | ICD-10-CM | POA: Diagnosis present

## 2015-08-04 DIAGNOSIS — M1611 Unilateral primary osteoarthritis, right hip: Principal | ICD-10-CM | POA: Diagnosis present

## 2015-08-04 DIAGNOSIS — F17200 Nicotine dependence, unspecified, uncomplicated: Secondary | ICD-10-CM | POA: Diagnosis present

## 2015-08-04 DIAGNOSIS — G8918 Other acute postprocedural pain: Secondary | ICD-10-CM

## 2015-08-04 DIAGNOSIS — Z419 Encounter for procedure for purposes other than remedying health state, unspecified: Secondary | ICD-10-CM

## 2015-08-04 HISTORY — PX: TOTAL HIP ARTHROPLASTY: SHX124

## 2015-08-04 LAB — CREATININE, SERUM
CREATININE: 0.78 mg/dL (ref 0.44–1.00)
GFR calc non Af Amer: >60 mL/min (ref >60)

## 2015-08-04 LAB — TYPE AND SCREEN
ABO/RH(D): B POS
ANTIBODY SCREEN: NEGATIVE

## 2015-08-04 LAB — CBC
HCT: 39.9 % (ref 35.0–47.0)
Hemoglobin: 13.4 g/dL (ref 12.0–16.0)
MCH: 31.2 pg (ref 26.0–34.0)
MCHC: 33.4 g/dL (ref 32.0–36.0)
MCV: 93.4 fL (ref 80.0–100.0)
PLATELETS: 194 10*3/uL (ref 150–440)
RBC: 4.28 MIL/uL (ref 3.80–5.20)
RDW: 13.7 % (ref 11.5–14.5)
WBC: 8.9 10*3/uL (ref 3.6–11.0)

## 2015-08-04 SURGERY — ARTHROPLASTY, HIP, TOTAL, ANTERIOR APPROACH
Anesthesia: General | Site: Hip | Laterality: Right | Wound class: Clean

## 2015-08-04 MED ORDER — SUCCINYLCHOLINE CHLORIDE 20 MG/ML IJ SOLN
INTRAMUSCULAR | Status: DC | PRN
  Administered 2015-08-04: 100 mg via INTRAVENOUS

## 2015-08-04 MED ORDER — HYDROMORPHONE HCL 1 MG/ML IJ SOLN
0.2500 mg | INTRAMUSCULAR | Status: DC | PRN
  Administered 2015-08-04 (×4): 0.5 mg via INTRAVENOUS

## 2015-08-04 MED ORDER — ENOXAPARIN SODIUM 40 MG/0.4ML ~~LOC~~ SOLN
40.0000 mg | SUBCUTANEOUS | Status: DC
  Administered 2015-08-05 – 2015-08-08 (×4): 40 mg via SUBCUTANEOUS
  Filled 2015-08-04 (×4): qty 0.4

## 2015-08-04 MED ORDER — DICYCLOMINE HCL 10 MG PO CAPS
10.0000 mg | ORAL_CAPSULE | Freq: Four times a day (QID) | ORAL | Status: DC
  Administered 2015-08-04 – 2015-08-08 (×15): 10 mg via ORAL
  Filled 2015-08-04 (×15): qty 1

## 2015-08-04 MED ORDER — ONDANSETRON HCL 4 MG/2ML IJ SOLN
4.0000 mg | Freq: Four times a day (QID) | INTRAMUSCULAR | Status: DC | PRN
  Administered 2015-08-04 – 2015-08-05 (×2): 4 mg via INTRAVENOUS
  Filled 2015-08-04 (×2): qty 2

## 2015-08-04 MED ORDER — DOCUSATE SODIUM 100 MG PO CAPS
100.0000 mg | ORAL_CAPSULE | Freq: Two times a day (BID) | ORAL | Status: DC
  Administered 2015-08-04 – 2015-08-08 (×8): 100 mg via ORAL
  Filled 2015-08-04 (×8): qty 1

## 2015-08-04 MED ORDER — CEFAZOLIN SODIUM-DEXTROSE 2-4 GM/100ML-% IV SOLN
2.0000 g | Freq: Once | INTRAVENOUS | Status: AC
  Administered 2015-08-04: 2 g via INTRAVENOUS

## 2015-08-04 MED ORDER — FAMOTIDINE 20 MG PO TABS
20.0000 mg | ORAL_TABLET | Freq: Two times a day (BID) | ORAL | Status: DC
  Administered 2015-08-04 – 2015-08-08 (×8): 20 mg via ORAL
  Filled 2015-08-04 (×8): qty 1

## 2015-08-04 MED ORDER — FLUOXETINE HCL 20 MG PO CAPS
20.0000 mg | ORAL_CAPSULE | Freq: Every day | ORAL | Status: DC
  Administered 2015-08-05 – 2015-08-08 (×4): 20 mg via ORAL
  Filled 2015-08-04 (×6): qty 1

## 2015-08-04 MED ORDER — LIDOCAINE HCL (CARDIAC) 20 MG/ML IV SOLN
INTRAVENOUS | Status: DC | PRN
  Administered 2015-08-04 (×3): 100 mg via INTRAVENOUS

## 2015-08-04 MED ORDER — ONDANSETRON HCL 4 MG/2ML IJ SOLN
INTRAMUSCULAR | Status: DC | PRN
  Administered 2015-08-04: 4 mg via INTRAVENOUS

## 2015-08-04 MED ORDER — BUPROPION HCL ER (SR) 150 MG PO TB12
150.0000 mg | ORAL_TABLET | Freq: Every day | ORAL | Status: DC
Start: 2015-08-04 — End: 2015-08-08
  Administered 2015-08-05 – 2015-08-08 (×4): 150 mg via ORAL
  Filled 2015-08-04 (×4): qty 1

## 2015-08-04 MED ORDER — METOCLOPRAMIDE HCL 10 MG PO TABS: 5.0000 mg | ORAL_TABLET | Freq: Three times a day (TID) | ORAL | Status: DC | PRN

## 2015-08-04 MED ORDER — MAGNESIUM CITRATE PO SOLN
1.0000 | Freq: Once | ORAL | Status: DC | PRN
  Filled 2015-08-04: qty 296

## 2015-08-04 MED ORDER — BUPIVACAINE-EPINEPHRINE (PF) 0.25% -1:200000 IJ SOLN
INTRAMUSCULAR | Status: AC
  Filled 2015-08-04: qty 30

## 2015-08-04 MED ORDER — TRANEXAMIC ACID 1000 MG/10ML IV SOLN
INTRAVENOUS | Status: AC
  Filled 2015-08-04: qty 10

## 2015-08-04 MED ORDER — OXYCODONE HCL 5 MG PO TABS
5.0000 mg | ORAL_TABLET | ORAL | Status: DC | PRN
  Administered 2015-08-04 (×2): 5 mg via ORAL
  Administered 2015-08-04 – 2015-08-05 (×5): 10 mg via ORAL
  Administered 2015-08-06: 5 mg via ORAL
  Administered 2015-08-06: 10 mg via ORAL
  Administered 2015-08-07: 5 mg via ORAL
  Filled 2015-08-04: qty 1
  Filled 2015-08-04 (×3): qty 2
  Filled 2015-08-04 (×2): qty 1
  Filled 2015-08-04 (×3): qty 2
  Filled 2015-08-04 (×2): qty 1
  Filled 2015-08-04: qty 2

## 2015-08-04 MED ORDER — LACTATED RINGERS IV SOLN
INTRAVENOUS | Status: DC
  Administered 2015-08-04 (×3): via INTRAVENOUS

## 2015-08-04 MED ORDER — ALUM & MAG HYDROXIDE-SIMETH 200-200-20 MG/5ML PO SUSP: 30.0000 mL | ORAL | Status: DC | PRN

## 2015-08-04 MED ORDER — METHOCARBAMOL 500 MG PO TABS
500.0000 mg | ORAL_TABLET | Freq: Four times a day (QID) | ORAL | Status: DC | PRN
Start: 2015-08-04 — End: 2015-08-08
  Administered 2015-08-05: 500 mg via ORAL
  Filled 2015-08-04: qty 1

## 2015-08-04 MED ORDER — PROPOFOL 10 MG/ML IV BOLUS
INTRAVENOUS | Status: DC | PRN
  Administered 2015-08-04: 50 mg via INTRAVENOUS
  Administered 2015-08-04: 150 mg via INTRAVENOUS

## 2015-08-04 MED ORDER — DONEPEZIL HCL 5 MG PO TABS
10.0000 mg | ORAL_TABLET | Freq: Every day | ORAL | Status: DC
  Administered 2015-08-04 – 2015-08-07 (×4): 10 mg via ORAL
  Filled 2015-08-04 (×4): qty 2

## 2015-08-04 MED ORDER — SIMVASTATIN 40 MG PO TABS
40.0000 mg | ORAL_TABLET | Freq: Every day | ORAL | Status: DC
  Administered 2015-08-04 – 2015-08-07 (×4): 40 mg via ORAL
  Filled 2015-08-04 (×4): qty 1

## 2015-08-04 MED ORDER — ONDANSETRON HCL 4 MG/2ML IJ SOLN
INTRAMUSCULAR | Status: AC
  Administered 2015-08-04: 4 mg via INTRAVENOUS
  Filled 2015-08-04: qty 2

## 2015-08-04 MED ORDER — TRANEXAMIC ACID 1000 MG/10ML IV SOLN
1000.0000 mg | INTRAVENOUS | Status: DC | PRN
  Administered 2015-08-04: 1000 mg via TOPICAL

## 2015-08-04 MED ORDER — QUETIAPINE FUMARATE 25 MG PO TABS
25.0000 mg | ORAL_TABLET | Freq: Every day | ORAL | Status: DC
  Administered 2015-08-04 – 2015-08-07 (×4): 25 mg via ORAL
  Filled 2015-08-04 (×4): qty 1

## 2015-08-04 MED ORDER — ROCURONIUM BROMIDE 100 MG/10ML IV SOLN
INTRAVENOUS | Status: DC | PRN
  Administered 2015-08-04: 10 mg via INTRAVENOUS
  Administered 2015-08-04: 30 mg via INTRAVENOUS
  Administered 2015-08-04: 10 mg via INTRAVENOUS

## 2015-08-04 MED ORDER — CEFAZOLIN SODIUM-DEXTROSE 2-4 GM/100ML-% IV SOLN
2.0000 g | Freq: Four times a day (QID) | INTRAVENOUS | Status: AC
  Administered 2015-08-04 – 2015-08-05 (×3): 2 g via INTRAVENOUS
  Filled 2015-08-04 (×4): qty 100

## 2015-08-04 MED ORDER — ZOLPIDEM TARTRATE 5 MG PO TABS
5.0000 mg | ORAL_TABLET | Freq: Every evening | ORAL | Status: DC | PRN
Start: 2015-08-04 — End: 2015-08-08
  Administered 2015-08-05: 5 mg via ORAL
  Filled 2015-08-04: qty 1

## 2015-08-04 MED ORDER — SUGAMMADEX SODIUM 200 MG/2ML IV SOLN
INTRAVENOUS | Status: DC | PRN
  Administered 2015-08-04: 179.6 mg via INTRAVENOUS

## 2015-08-04 MED ORDER — FENTANYL CITRATE (PF) 100 MCG/2ML IJ SOLN
INTRAMUSCULAR | Status: AC
  Administered 2015-08-04: 25 ug via INTRAVENOUS
  Filled 2015-08-04: qty 2

## 2015-08-04 MED ORDER — ONDANSETRON HCL 4 MG/2ML IJ SOLN
4.0000 mg | Freq: Once | INTRAMUSCULAR | Status: AC
  Administered 2015-08-04: 4 mg via INTRAVENOUS

## 2015-08-04 MED ORDER — MENTHOL 3 MG MT LOZG
1.0000 | LOZENGE | OROMUCOSAL | Status: DC | PRN
  Filled 2015-08-04: qty 9

## 2015-08-04 MED ORDER — HYDROMORPHONE HCL 1 MG/ML IJ SOLN
0.5000 mg | INTRAMUSCULAR | Status: DC | PRN
Start: 2015-08-04 — End: 2015-08-08
  Administered 2015-08-04 – 2015-08-05 (×4): 0.5 mg via INTRAVENOUS
  Filled 2015-08-04 (×4): qty 1

## 2015-08-04 MED ORDER — BUPIVACAINE-EPINEPHRINE (PF) 0.25% -1:200000 IJ SOLN
INTRAMUSCULAR | Status: DC | PRN
  Administered 2015-08-04: 30 mL via PERINEURAL

## 2015-08-04 MED ORDER — PHENOL 1.4 % MT LIQD
1.0000 | OROMUCOSAL | Status: DC | PRN
  Filled 2015-08-04: qty 177

## 2015-08-04 MED ORDER — DEXAMETHASONE SODIUM PHOSPHATE 10 MG/ML IJ SOLN
INTRAMUSCULAR | Status: DC | PRN
  Administered 2015-08-04: 8 mg via INTRAVENOUS

## 2015-08-04 MED ORDER — NEOMYCIN-POLYMYXIN B GU 40-200000 IR SOLN
Status: DC | PRN
  Administered 2015-08-04: 4 mL

## 2015-08-04 MED ORDER — NEOMYCIN-POLYMYXIN B GU 40-200000 IR SOLN
Status: AC
  Filled 2015-08-04: qty 4

## 2015-08-04 MED ORDER — BISACODYL 5 MG PO TBEC
5.0000 mg | DELAYED_RELEASE_TABLET | Freq: Every day | ORAL | Status: DC | PRN
  Administered 2015-08-07: 5 mg via ORAL
  Filled 2015-08-04: qty 1

## 2015-08-04 MED ORDER — METOCLOPRAMIDE HCL 5 MG/ML IJ SOLN
5.0000 mg | Freq: Three times a day (TID) | INTRAMUSCULAR | Status: DC | PRN
  Administered 2015-08-04: 5 mg via INTRAVENOUS
  Filled 2015-08-04: qty 2

## 2015-08-04 MED ORDER — FENTANYL CITRATE (PF) 100 MCG/2ML IJ SOLN
25.0000 ug | INTRAMUSCULAR | Status: DC | PRN
  Administered 2015-08-04 (×4): 25 ug via INTRAVENOUS

## 2015-08-04 MED ORDER — FENTANYL CITRATE (PF) 100 MCG/2ML IJ SOLN
INTRAMUSCULAR | Status: DC | PRN
  Administered 2015-08-04: 100 ug via INTRAVENOUS

## 2015-08-04 MED ORDER — ACETAMINOPHEN 650 MG RE SUPP: 650.0000 mg | Freq: Four times a day (QID) | RECTAL | Status: DC | PRN

## 2015-08-04 MED ORDER — PHENYLEPHRINE HCL 10 MG/ML IJ SOLN
INTRAMUSCULAR | Status: DC | PRN
  Administered 2015-08-04 (×3): 100 ug via INTRAVENOUS

## 2015-08-04 MED ORDER — MAGNESIUM HYDROXIDE 400 MG/5ML PO SUSP
30.0000 mL | Freq: Every day | ORAL | Status: DC | PRN
  Administered 2015-08-06: 30 mL via ORAL
  Filled 2015-08-04: qty 30

## 2015-08-04 MED ORDER — DEXTROSE 5 % IV SOLN
500.0000 mg | Freq: Four times a day (QID) | INTRAVENOUS | Status: DC | PRN
  Filled 2015-08-04: qty 5

## 2015-08-04 MED ORDER — HYDROMORPHONE HCL 1 MG/ML IJ SOLN
INTRAMUSCULAR | Status: AC
  Administered 2015-08-04: 0.5 mg via INTRAVENOUS
  Filled 2015-08-04: qty 1

## 2015-08-04 MED ORDER — VITAMIN B-12 1000 MCG PO TABS
1000.0000 ug | ORAL_TABLET | Freq: Every day | ORAL | Status: DC
  Administered 2015-08-05 – 2015-08-08 (×4): 1000 ug via ORAL
  Filled 2015-08-04 (×4): qty 1

## 2015-08-04 MED ORDER — OXYCODONE HCL 5 MG PO TABS: 5.0000 mg | ORAL_TABLET | Freq: Once | ORAL | Status: DC | PRN

## 2015-08-04 MED ORDER — ACETAMINOPHEN 325 MG PO TABS
650.0000 mg | ORAL_TABLET | Freq: Four times a day (QID) | ORAL | Status: DC | PRN
  Administered 2015-08-06 – 2015-08-08 (×2): 650 mg via ORAL
  Filled 2015-08-04 (×2): qty 2

## 2015-08-04 MED ORDER — NICOTINE 7 MG/24HR TD PT24
7.0000 mg | MEDICATED_PATCH | Freq: Every day | TRANSDERMAL | Status: DC
  Administered 2015-08-04 – 2015-08-08 (×4): 7 mg via TRANSDERMAL
  Filled 2015-08-04 (×5): qty 1

## 2015-08-04 MED ORDER — SODIUM CHLORIDE 0.9 % IV SOLN
INTRAVENOUS | Status: DC
  Administered 2015-08-04 – 2015-08-05 (×2): via INTRAVENOUS

## 2015-08-04 MED ORDER — TRANEXAMIC ACID 1000 MG/10ML IV SOLN
1500.0000 mg | INTRAVENOUS | Status: AC
  Administered 2015-08-04: 1000 mg via INTRAVENOUS
  Filled 2015-08-04: qty 15

## 2015-08-04 MED ORDER — PANTOPRAZOLE SODIUM 40 MG PO TBEC
80.0000 mg | DELAYED_RELEASE_TABLET | Freq: Every day | ORAL | Status: DC
  Administered 2015-08-05 – 2015-08-08 (×4): 80 mg via ORAL
  Filled 2015-08-04 (×4): qty 2

## 2015-08-04 MED ORDER — DIPHENHYDRAMINE HCL 12.5 MG/5ML PO ELIX
12.5000 mg | ORAL_SOLUTION | ORAL | Status: DC | PRN
  Filled 2015-08-04: qty 10

## 2015-08-04 MED ORDER — OXYCODONE HCL 5 MG/5ML PO SOLN: 5.0000 mg | Freq: Once | ORAL | Status: DC | PRN

## 2015-08-04 MED ORDER — ONDANSETRON HCL 4 MG PO TABS: 4.0000 mg | ORAL_TABLET | Freq: Four times a day (QID) | ORAL | Status: DC | PRN

## 2015-08-04 SURGICAL SUPPLY — 47 items
BLADE SAW SAG 18.5X105 (BLADE) ×3 IMPLANT
BNDG COHESIVE 6X5 TAN STRL LF (GAUZE/BANDAGES/DRESSINGS) ×6 IMPLANT
CANISTER SUCT 1200ML W/VALVE (MISCELLANEOUS) ×3 IMPLANT
CAPT HIP TOTAL 3 ×2 IMPLANT
CATH FOL LEG HOLDER (MISCELLANEOUS) ×3 IMPLANT
CATH TRAY METER 16FR LF (MISCELLANEOUS) ×3 IMPLANT
CHLORAPREP W/TINT 26ML (MISCELLANEOUS) ×3 IMPLANT
DRAPE C-ARM XRAY 36X54 (DRAPES) ×3 IMPLANT
DRAPE INCISE IOBAN 66X60 STRL (DRAPES) IMPLANT
DRAPE POUCH INSTRU U-SHP 10X18 (DRAPES) ×3 IMPLANT
DRAPE SHEET LG 3/4 BI-LAMINATE (DRAPES) ×9 IMPLANT
DRAPE STERI IOBAN 125X83 (DRAPES) ×3 IMPLANT
DRAPE TABLE BACK 80X90 (DRAPES) ×3 IMPLANT
DRSG OPSITE POSTOP 4X8 (GAUZE/BANDAGES/DRESSINGS) ×6 IMPLANT
ELECT BLADE 6.5 EXT (BLADE) ×3 IMPLANT
GAUZE SPONGE 4X4 12PLY STRL (GAUZE/BANDAGES/DRESSINGS) IMPLANT
GLOVE BIOGEL PI IND STRL 9 (GLOVE) ×1 IMPLANT
GLOVE BIOGEL PI INDICATOR 9 (GLOVE) ×2
GLOVE SURG ORTHO 9.0 STRL STRW (GLOVE) ×6 IMPLANT
GOWN STRL REUS W/ TWL LRG LVL3 (GOWN DISPOSABLE) ×1 IMPLANT
GOWN STRL REUS W/TWL LRG LVL3 (GOWN DISPOSABLE) ×3
GOWN SURG XXL (GOWNS) ×3 IMPLANT
HEMOVAC 400CC 10FR (MISCELLANEOUS) ×3 IMPLANT
HOOD PEEL AWAY FLYTE STAYCOOL (MISCELLANEOUS) ×3 IMPLANT
KIT PREVENA INCISION MGT 13 (CANNISTER) ×2 IMPLANT
MAT BLUE FLOOR 46X72 FLO (MISCELLANEOUS) ×3 IMPLANT
NDL SAFETY 18GX1.5 (NEEDLE) ×3 IMPLANT
NDL SPNL 18GX3.5 QUINCKE PK (NEEDLE) ×1 IMPLANT
NEEDLE SPNL 18GX3.5 QUINCKE PK (NEEDLE) ×3 IMPLANT
NS IRRIG 1000ML POUR BTL (IV SOLUTION) ×3 IMPLANT
PACK HIP COMPR (MISCELLANEOUS) ×3 IMPLANT
SOL PREP PVP 2OZ (MISCELLANEOUS) ×3
SOLUTION PREP PVP 2OZ (MISCELLANEOUS) ×1 IMPLANT
STAPLER SKIN PROX 35W (STAPLE) ×3 IMPLANT
STRAP SAFETY BODY (MISCELLANEOUS) ×3 IMPLANT
SUT DVC 2 QUILL PDO  T11 36X36 (SUTURE) ×2
SUT DVC 2 QUILL PDO T11 36X36 (SUTURE) ×1 IMPLANT
SUT DVC QUILL MONODERM 30X30 (SUTURE) ×3 IMPLANT
SUT SILK 0 (SUTURE) ×3
SUT SILK 0 30XBRD TIE 6 (SUTURE) ×1 IMPLANT
SUT VIC AB 1 CT1 36 (SUTURE) ×3 IMPLANT
SYR 20CC LL (SYRINGE) ×3 IMPLANT
SYR 30ML LL (SYRINGE) ×3 IMPLANT
SYRINGE 10CC LL (SYRINGE) ×2 IMPLANT
TAPE MICROFOAM 4IN (TAPE) ×3 IMPLANT
TOWEL OR 17X26 4PK STRL BLUE (TOWEL DISPOSABLE) ×3 IMPLANT
TUBE KAMVAC SUCTION (TUBING) ×3 IMPLANT

## 2015-08-04 NOTE — Anesthesia Preprocedure Evaluation (Signed)
Anesthesia Evaluation  Patient identified by MRN, date of birth, ID band Patient awake    Reviewed: Allergy & Precautions, H&P , NPO status , Patient's Chart, lab work & pertinent test results  History of Anesthesia Complications (+) PONV and history of anesthetic complications  Airway Mallampati: III  TM Distance: <3 FB Neck ROM: limited    Dental  (+) Poor Dentition, Chipped   Pulmonary neg shortness of breath, COPD, Current Smoker,    Pulmonary exam normal breath sounds clear to auscultation       Cardiovascular Exercise Tolerance: Good hypertension, (-) angina(-) Past MI and (-) DOE Normal cardiovascular exam Rhythm:regular Rate:Normal     Neuro/Psych  Headaches, PSYCHIATRIC DISORDERS Anxiety Depression  Neuromuscular disease    GI/Hepatic Neg liver ROS, GERD  Controlled,  Endo/Other  negative endocrine ROS  Renal/GU negative Renal ROS  negative genitourinary   Musculoskeletal  (+) Arthritis ,   Abdominal   Peds  Hematology negative hematology ROS (+)   Anesthesia Other Findings Past Medical History:   ALCOHOL ABUSE, HX OF                                         ANEMIA-NOS                                                   ANXIETY                                                      DEGENERATIVE DISC DISEASE, CERVICAL SPINE, 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*              DEGENERATIVE DISC DISEASE, CERVICAL SPINE                    Depressive disorder, not elsewhere classified                Depressive type psychosis (HCC)                              DIVERTICULITIS, HX OF                                        DIZZINESS                                                    GERD                                                         HYPERLIPIDEMIA  HYPERTENSION                                                 Insomnia, unspecified                                        LOW BACK PAIN                                                 Memory loss                                                  MIGRAINE HEADACHE                                            WEIGHT LOSS                                                  Palpitation                                                  CERVICAL RADICULOPATHY, LEFT                                Past Surgical History:   LUMBAR FUSION                                                 ESOPHAGOGASTRODUODENOSCOPY                      N/A 01/03/2015      Comment:Procedure: ESOPHAGOGASTRODUODENOSCOPY (EGD);                Surgeon: Elnita Maxwell, MD;  Location:               [facility] ENDOSCOPY;  Service: Endoscopy;                Laterality: N/A;   BACK SURGERY                                                  TONSILLECTOMY  EYE SURGERY                                     Bilateral                Comment:Cataract Extraction with IOL   DILATION AND CURETTAGE OF UTERUS                              CERVICAL FUSION                                                 Comment:X 2, [place]  BMI    Body Mass Index   31.97 kg/m 2      Reproductive/Obstetrics negative OB ROS                             Anesthesia Physical Anesthesia Plan  ASA: III  Anesthesia Plan: General ETT   Post-op Pain Management:    Induction:   Airway Management Planned:   Additional Equipment:   Intra-op Plan:   Post-operative Plan:   Informed Consent: I have reviewed the patients History and Physical, chart, labs and discussed the procedure including the risks, benefits and alternatives for the proposed anesthesia with the patient or authorized representative who has indicated his/her understanding and acceptance.   Dental Advisory Given  Plan Discussed with: Anesthesiologist, CRNA and Surgeon  Anesthesia Plan Comments: (Patient and daughter would like us to start with GA due to  patients history of back problems and back surgeries. Daughter and patient consented.  Daughter informed that patient is at increased risk for post operative neurologic complications. )        Anesthesia Quick Evaluation

## 2015-08-04 NOTE — Transfer of Care (Signed)
Immediate Anesthesia Transfer of Care Note  Patient: Laurie JarvisLinda S Saintil  Procedure(s) Performed: Procedure(s): TOTAL HIP ARTHROPLASTY ANTERIOR APPROACH (Right)  Patient Location: PACU  Anesthesia Type:General  Level of Consciousness: awake, alert  and oriented  Airway & Oxygen Therapy: Patient Spontanous Breathing and Patient connected to face mask oxygen  Post-op Assessment: Report given to RN and Post -op Vital signs reviewed and stable  Post vital signs: Reviewed and stable  Last Vitals:  Filed Vitals:   08/04/15 0707 08/04/15 1105  BP: 147/79 156/86  Pulse: 91 78  Temp: 36.8 C 36.9 C  Resp: 18 15    Last Pain:  Filed Vitals:   08/04/15 1118  PainSc: 4          Complications: No apparent anesthesia complications

## 2015-08-04 NOTE — H&P (Signed)
Reviewed paper H+P, will be scanned into chart. No changes noted.  

## 2015-08-04 NOTE — Anesthesia Postprocedure Evaluation (Signed)
Anesthesia Post Note  Patient: Laurie JarvisLinda S [name]  Procedure(s) Performed: Procedure(s) (LRB): TOTAL HIP ARTHROPLASTY ANTERIOR APPROACH (Right)  Patient location during evaluation: PACU Anesthesia Type: General Level of consciousness: awake and alert Pain management: pain level controlled Vital Signs Assessment: post-procedure vital signs reviewed and stable Respiratory status: spontaneous breathing, nonlabored ventilation, respiratory function stable and patient connected to nasal cannula oxygen Cardiovascular status: blood pressure returned to baseline and stable Postop Assessment: no signs of nausea or vomiting Anesthetic complications: no    Last Vitals:  Filed Vitals:   08/04/15 1218 08/04/15 1237  BP:  144/72  Pulse: 80 65  Temp:  36.8 C  Resp: 17 16    Last Pain:  Filed Vitals:   08/04/15 1237  PainSc: 2                  Cleda MccreedyJoseph K Jezebelle Ledwell

## 2015-08-04 NOTE — Op Note (Signed)
08/04/2015  11:09 AM  PATIENT:  Laurie JarvisLinda S [name]  70 y.o. female  PRE-OPERATIVE DIAGNOSIS: Primary right hip osteoarthritis  POST-OPERATIVE DIAGNOSIS:  Same PROCEDURE:  Procedure(s): TOTAL HIP ARTHROPLASTY ANTERIOR APPROACH (Right)  SURGEON: Leitha SchullerMichael J Josi Roediger, MD  ASSISTANTS: None  ANESTHESIA:   general  EBL:  Total I/O In: -  Out: 800 [Urine:300; Blood:500]  BLOOD ADMINISTERED:none  DRAINS: (2) Hemovact drain(s) in the Subcutaneous layer with  Suction Open   LOCAL MEDICATIONS USED:  MARCAINE     SPECIMEN:  Source of Specimen:  Right femoral head  DISPOSITION OF SPECIMEN:  PATHOLOGY  COUNTS:  YES  TOURNIQUET:  * No tourniquets in log *  IMPLANTS: Medacta 48 mm Mpact cup DM with liner,AMIS 2 collared stem with S 28 mm head  DICTATION: .Dragon Dictation   The patient was brought to the operating room and after general anesthesia was obtained patient was placed on the operative table with the ipsilateral foot into the Medacta attachment, contralateral leg on a well-padded table. C-arm was brought in and preop template x-ray taken. After prepping and draping in usual sterile fashion appropriate patient identification and timeout procedures were completed. Anterior approach to the hip was obtained and centered over the greater trochanter and TFL muscle. The subcutaneous tissue was incised hemostasis being achieved by electrocautery. TFL fascia was incised and the muscle retracted laterally deep retractor placed. The lateral femoral circumflex vessels were identified and ligated. The anterior capsule was exposed and a capsulotomy performed. The neck was identified and a femoral neck cut carried out with a saw. The head was removed without difficulty and showed sclerotic femoral head and acetabulum. Reaming was carried out to 48 mm and a 48 mm cup trial gave appropriate tightness to the acetabular component a 48 millimeter Mpact DM cup was impacted into position. The leg was then externally  rotated and ischiofemoral and pubofemoral releases carried out. The femur was sequentially broached to a size 2, size 2 stem with standard neck and S head trials were placed and the final components chosen. The 2 standard stem was inserted along with a S 28 mm head and 48 mm liner. The hip was reduced and was stable the wound was thoroughly irrigated with a dilute Betadine solution. The deep fascia v was closed using  a heavy Quill after infiltration of 30 cc of quarter percent Sensorcaine with epinephrine. TXA was injected into the joint after the deep fascia was closed. Subcutaneous drains were then inserted. 2-0 Quill to close the skin with skin staples and then a Provena wound VAC applied over the incision  PLAN OF CARE: Admit to inpatient

## 2015-08-04 NOTE — Evaluation (Signed)
Physical Therapy Evaluation Patient Details Name: Laurie LollLinda S Derouin MRN: [id number] DOB: Jun 30, 1945 Today's Date: 08/04/2015   History of Present Illness  Pt underwent R THR without reported post-op complications. She is POD#0 at time of initial evaluation. Pt denies falls in the last 12 months  Clinical Impression  Pt requires heavy encouragement to participate with physical therapy on this date. She eventually agrees to bed exercises but throughout session agrees to bed mobility, transfers, and then short ambulation from bed to recliner. Pt requires assist for bed mobility but is CGA only for transfers and limited ambulation from bed to recliner. Pt will benefit from skilled PT services to address deficits in strength, balance, and mobility in order to return to full function at home.     Follow Up Recommendations Home health PT;Other (comment) ([facility])    Equipment Recommendations  None recommended by PT;Other (comment) (Pt reports she has access to rolling walker)    Recommendations for Other Services       Precautions / Restrictions Precautions Precautions: Anterior Hip Precaution Booklet Issued: Yes (comment) Restrictions Weight Bearing Restrictions: Yes RLE Weight Bearing: Weight bearing as tolerated      Mobility  Bed Mobility Overal bed mobility: Needs Assistance Bed Mobility: Supine to Sit     Supine to sit: Mod assist     General bed mobility comments: Pt requires cues for hand placement and sequencing with bed mobility. ModA+1 for rolling and to come to sitting from R sidelying  Transfers Overall transfer level: Needs assistance Equipment used: Rolling walker (2 wheeled) Transfers: Sit to/from Stand Sit to Stand: Min guard         General transfer comment: Pt requires cues for safe hand placement and sequencing during transfers. Decreased weight shifting to RLE during transfer. Once upright pt stable in standing with UE  support  Ambulation/Gait Ambulation/Gait assistance: Min guard Ambulation Distance (Feet): 5 Feet Assistive device: Rolling walker (2 wheeled) Gait Pattern/deviations: Step-to pattern;Decreased step length - left;Decreased stance time - right;Decreased weight shift to right;Antalgic Gait velocity: Decreased Gait velocity interpretation: <1.8 ft/sec, indicative of risk for recurrent falls General Gait Details: Pt takes short shuffling steps to ambulate from bed to recliner. Cues for sequencing with walker. Decreased weight shift to RLE with heavy UE reliance. Decreased L step length. Pt able to safetly transfer from bed to recliner  Stairs            Wheelchair Mobility    Modified Rankin (Stroke Patients Only)       Balance Overall balance assessment: Needs assistance Sitting-balance support: No upper extremity supported Sitting balance-Leahy Scale: Good     Standing balance support: Bilateral upper extremity supported Standing balance-Leahy Scale: Poor Standing balance comment: Pt requires UE support in standing                             Pertinent Vitals/Pain Pain Assessment: 0-10 Pain Score: 5  Pain Location: R hip Pain Descriptors / Indicators: Operative site guarding Pain Intervention(s): Limited activity within patient's tolerance;Monitored during session;Patient requesting pain meds-RN notified    Home Living Family/patient expects to be discharged to:: Assisted living               Home Equipment: Walker - 4 wheels;Shower seat;Grab bars - tub/shower (Pt reports she has access to RW at facility) Additional Comments: [facility] lives at [facility]    Prior Function Level of Independence: Needs  assistance   Gait / Transfers Assistance Needed: Pt reports facility ambulation with rollator  ADL's / Homemaking Assistance Needed: Independent with ADLs, requires assist for IADLs        Hand Dominance   Dominant Hand: Left    Extremity/Trunk  Assessment   Upper Extremity Assessment: Overall WFL for tasks assessed           Lower Extremity Assessment: RLE deficits/detail RLE Deficits / Details: LLE strength appears grossly WFL. Pt requires heavy assistance for R SLR and R hip abduction. Full DF/PF. Pt reports full sensation to RLE with light touch       Communication   Communication: No difficulties  Cognition Arousal/Alertness: Awake/alert Behavior During Therapy: WFL for tasks assessed/performed Overall Cognitive Status: Within Functional Limits for tasks assessed                      General Comments      Exercises Total Joint Exercises Ankle Circles/Pumps: Strengthening;Both;10 reps;Supine Quad Sets: Strengthening;Both;10 reps;Supine Gluteal Sets: Strengthening;Both;10 reps;Supine Towel Squeeze: Strengthening;Both;10 reps;Supine Hip ABduction/ADduction: Strengthening;Right;10 reps;Supine Straight Leg Raises: Strengthening;Right;10 reps;Supine      Assessment/Plan    PT Assessment Patient needs continued PT services  PT Diagnosis Difficulty walking;Abnormality of gait;Generalized weakness;Acute pain   PT Problem List Decreased strength;Decreased activity tolerance;Decreased balance;Decreased mobility;Decreased knowledge of use of DME;Decreased safety awareness;Pain  PT Treatment Interventions DME instruction;Gait training;Stair training;Functional mobility training;Therapeutic activities;Therapeutic exercise;Balance training;Neuromuscular re-education;Patient/family education;Manual techniques   PT Goals (Current goals can be found in the Care Plan section) Acute Rehab PT Goals Patient Stated Goal: t+14 PT Goal Formulation: With patient Time For Goal Achievement: 08/18/15 Potential to Achieve Goals: Good    Frequency BID   Barriers to discharge        Co-evaluation               End of Session Equipment Utilized During Treatment: Gait belt Activity Tolerance: Patient tolerated  treatment well Patient left: in chair;with call bell/phone within reach;with chair alarm set;Other (comment);with SCD's reapplied (CNA taking vitals) Nurse Communication: Mobility status;Patient requests pain meds         Time: 1445-1520 PT Time Calculation (min) (ACUTE ONLY): 35 min   Charges:   PT Evaluation $PT Eval Low Complexity: 1 Procedure PT Treatments $Therapeutic Exercise: 8-22 mins   PT G Codes:       Sharalyn Ink Huprich PT, DPT   Huprich,Jason 08/04/2015, 3:41 PM

## 2015-08-04 NOTE — Progress Notes (Signed)
zofran given for nausea  Better now

## 2015-08-05 LAB — BASIC METABOLIC PANEL
Anion gap: 6 (ref 5–15)
BUN: 9 mg/dL (ref 6–20)
CALCIUM: 8.6 mg/dL — AB (ref 8.9–10.3)
CO2: 26 mmol/L (ref 22–32)
CREATININE: 0.63 mg/dL (ref 0.44–1.00)
Chloride: 108 mmol/L (ref 101–111)
GFR calc non Af Amer: >60 mL/min (ref >60)
Glucose, Bld: 122 mg/dL — ABNORMAL HIGH (ref 65–99)
Potassium: 4 mmol/L (ref 3.5–5.1)
Sodium: 140 mmol/L (ref 135–145)

## 2015-08-05 LAB — CBC
HCT: 34.2 % — ABNORMAL LOW (ref 35.0–47.0)
Hemoglobin: 11.3 g/dL — ABNORMAL LOW (ref 12.0–16.0)
MCH: 31.2 pg (ref 26.0–34.0)
MCHC: 33.1 g/dL (ref 32.0–36.0)
MCV: 94.2 fL (ref 80.0–100.0)
Platelets: 174 10*3/uL (ref 150–440)
RBC: 3.62 MIL/uL — AB (ref 3.80–5.20)
RDW: 13.4 % (ref 11.5–14.5)
WBC: 14 10*3/uL — ABNORMAL HIGH (ref 3.6–11.0)

## 2015-08-05 NOTE — Progress Notes (Signed)
Pt alert and oriented x4, no complaints of pain or discomfort.  Bed in low position, call bell within reach.  Bed alarms on and functioning.  Assessment done and charted.  Will continue to monitor and do hourly rounding throughout the shift 

## 2015-08-05 NOTE — Care Management (Signed)
[facility]. Patient has a front-wheeled walker "but doesn't like to use it". Med tech I spoke with  [phone] said that their physical therapist wants patient to go to SNF for rehab "because she doesn't do as well with outpatient PT as she does at SNF". I explained that our PT is recommending HHPT at this time and asked that the [facility] come evaluate patient. I explained that the PT department would change recommendation if needed but right now the recommendation is for HHPT. [facility]. RNCM will follow along with CSW.

## 2015-08-05 NOTE — Progress Notes (Signed)
Physical Therapy Treatment Patient Details Name: [name] MRN: [id number] DOB: 02-21-1946 Today's Date: 08/05/2015    History of Present Illness Pt underwent R THR without reported post-op complications. She is POD#0 at time of initial evaluation. Pt denies falls in the last 12 months    PT Comments    Pt initially not wanting to work with PT (wanting to nap) but I was able to convince her that she had to do at least something.  She was able to do some in-room walking and a few seated exercises and stated that she would do more this afternoon but that she didn't want to over-do it at this time.  Pt did well with mobility and was safe with ambulation despite her relatively limited activity level during session.   Follow Up Recommendations  Home health PT;Other (comment)     Equipment Recommendations       Recommendations for Other Services       Precautions / Restrictions Precautions Precautions: Anterior Hip Restrictions RLE Weight Bearing: Weight bearing as tolerated    Mobility  Bed Mobility Overal bed mobility: Modified Independent Bed Mobility: Supine to Sit           General bed mobility comments: Pt able to get to EOB with heavy rail use but no direct assist  Transfers Overall transfer level: Needs assistance Equipment used: Rolling walker (2 wheeled) Transfers: Sit to/from Stand Sit to Stand: Min guard            Ambulation/Gait Ambulation/Gait assistance: Min guard Ambulation Distance (Feet): 15 Feet Assistive device: Rolling walker (2 wheeled)       General Gait Details: Pt was safe and consistent with ambulation but was slow, hesitant and ultimately she appeared [place] limiting and likely could have walked further.  Pt with no LOBs, overt fatigue or signficant safety issues.    Stairs            Wheelchair Mobility    Modified Rankin (Stroke Patients Only)       Balance                                    Cognition  Arousal/Alertness: Awake/alert Behavior During Therapy: WFL for tasks assessed/performed Overall Cognitive Status: Within Functional Limits for tasks assessed                      Exercises Total Joint Exercises Ankle Circles/Pumps: AROM;10 reps Heel Slides: 10 reps;AROM Hip ABduction/ADduction: AROM;10 reps;Strengthening Long Arc Quad: Strengthening;10 reps    General Comments        Pertinent Vitals/Pain Pain Score: 6     Home Living                      Prior Function            PT Goals (current goals can now be found in the care plan section) Progress towards PT goals: Progressing toward goals    Frequency  BID    PT Plan Current plan remains appropriate    Co-evaluation             End of Session Equipment Utilized During Treatment: Gait belt Activity Tolerance: Patient tolerated treatment well Patient left: in chair;with call bell/phone within reach;with chair alarm set;Other (comment);with SCD's reapplied     Time: [id number] PT Time Calculation (min) (ACUTE ONLY): 30 min  Charges:  $  Gait Training: 8-22 mins $Therapeutic Exercise: 8-22 mins                    G Codes:      Malachi Pro , DPT  08/05/2015, 11:41 AM

## 2015-08-05 NOTE — Progress Notes (Signed)
   Subjective: 1 Day Post-Op Procedure(s) (LRB): TOTAL HIP ARTHROPLASTY ANTERIOR APPROACH (Right) Patient reports pain as mild.   Patient is well, and has had no acute complaints or problems Denies any CP, SOB, ABD pain. We will continue therapy today.  Plan is to go Rehab after hospital stay.  Objective: Vital signs in last 24 hours: Temp:  [97.2 F (36.2 C)-98.5 F (36.9 C)] 98.2 F (36.8 C) (06/09 0751) Pulse Rate:  [65-84] 74 (06/09 0751) Resp:  [12-21] 18 (06/09 0751) BP: (111-168)/(57-88) 113/61 mmHg (06/09 0751) SpO2:  [92 %-100 %] 97 % (06/09 0751) FiO2 (%):  [28 %] 28 % (06/08 1231)  Intake/Output from previous day: 06/08 0701 - 06/09 0700 In: 1418.3 [P.O.:680; I.V.:638.3; IV Piggyback:100] Out: 2610 [Urine:1880; Drains:230; Blood:500] Intake/Output this shift:     Recent Labs  08/04/15 0729 08/05/15 0448  HGB 13.4 11.3*    Recent Labs  08/04/15 0729 08/05/15 0448  WBC 8.9 14.0*  RBC 4.28 3.62*  HCT 39.9 34.2*  PLT 194 174    Recent Labs  08/04/15 0729 08/05/15 0448  NA  --  140  K  --  4.0  CL  --  108  CO2  --  26  BUN  --  9  CREATININE 0.78 0.63  GLUCOSE  --  122*  CALCIUM  --  8.6*   No results for input(s): LABPT, INR in the last 72 hours.  EXAM General - Patient is Alert, Appropriate and Oriented Extremity - Neurovascular intact Sensation intact distally Intact pulses distally Dorsiflexion/Plantar flexion intact No cellulitis present Dressing - dressing C/D/I and hemovac and wound vac intact Motor Function - intact, moving foot and toes well on exam.   Past Medical History  Diagnosis Date  . ALCOHOL ABUSE, HX OF   . ANEMIA-NOS   . ANXIETY   . DEGENERATIVE DISC DISEASE, CERVICAL SPINE, W/RADICULOPATHY   . DEGENERATIVE DISC DISEASE, CERVICAL SPINE   . Depressive disorder, not elsewhere classified   . Depressive type psychosis (HCC)   . DIVERTICULITIS, HX OF   . DIZZINESS   . GERD   . HYPERLIPIDEMIA   . HYPERTENSION    . Insomnia, unspecified   . LOW BACK PAIN   . Memory loss   . MIGRAINE HEADACHE   . WEIGHT LOSS   . Palpitation   . CERVICAL RADICULOPATHY, LEFT     Assessment/Plan:   1 Day Post-Op Procedure(s) (LRB): TOTAL HIP ARTHROPLASTY ANTERIOR APPROACH (Right) Active Problems:   Primary osteoarthritis of right hip  Estimated body mass index is 31.97 kg/(m^2) as calculated from the following:   Height as of this encounter: 5\' 6"  (1.676 m).   Weight as of this encounter: 89.812 kg (198 lb). Advance diet Up with therapy  Needs BM  Recheck labs in the am  DVT Prophylaxis - Lovenox, Foot Pumps and TED hose Weight-Bearing as tolerated to right leg D/C O2 and Pulse OX and try on Room Air  T. Cranston Neighborhris Gaines, PA-C [facility] 08/05/2015, 7:56 AM

## 2015-08-05 NOTE — Care Management Important Message (Signed)
Important Message  Patient Details  Name: [name] MRN: [id number] Date of Birth: Jan 28, 1946   Medicare Important Message Given:  Yes    [name], RN 08/05/2015, 8:44 AM

## 2015-08-05 NOTE — NC FL2 (Signed)
[place] MEDICAID FL2 LEVEL OF CARE SCREENING TOOL     IDENTIFICATION  Patient Name: [name] Birthdate: 07-Mar-1945 Sex: female Admission Date (Current Location): 08/04/2015  [facility] [place] Number:  [address]  ([id number]) Facility and Address:  [facility], [address], [place], [place] [address]      Provider Number: [id number]  Attending Physician Name and Address:  Kennedy Bucker, MD  Relative Name and Phone Number:       Current Level of Care: Hospital Recommended Level of Care: SNF Prior Approval Number:    Date Approved/Denied:   PASRR Number: [id number]  Discharge Plan: Domiciliary (Rest home)    Current Diagnoses: Patient Active Problem List   Diagnosis Date Noted  . Primary osteoarthritis of right hip 08/04/2015  . Preventative health care 01/15/2011  . WEIGHT LOSS 06/09/2009  . Depressive type psychosis (HCC) 10/12/2008  . CERVICAL RADICULOPATHY, LEFT 10/12/2008  . DIZZINESS 09/29/2008  . DEGENERATIVE DISC DISEASE, CERVICAL SPINE, W/RADICULOPATHY 07/21/2008  . Memory loss 01/09/2008  . Depressive disorder, not elsewhere classified 07/16/2007  . ANEMIA-NOS 01/14/2007  . HYPERTENSION 01/14/2007  . DEGENERATIVE DISC DISEASE, CERVICAL SPINE 01/14/2007  . LOW BACK PAIN 01/14/2007  . HYPERLIPIDEMIA 10/25/2006  . MIGRAINE HEADACHE 10/25/2006  . Insomnia, unspecified 10/25/2006  . ALCOHOL ABUSE, HX OF 10/25/2006  . DIVERTICULITIS, HX OF 10/25/2006  . ANXIETY 09/19/2006  . GERD 09/19/2006    Orientation RESPIRATION BLADDER Height & Weight     Manny, Time, Situation, Place  Normal Continent Weight: 198 lb (89.812 kg) Height:   (167.6 cm)  BEHAVIORAL SYMPTOMS/MOOD NEUROLOGICAL BOWEL NUTRITION STATUS   (None)  (None) Continent Diet (Regular )  AMBULATORY STATUS COMMUNICATION OF NEEDS Skin   Extensive Assist Verbally Other (Comment) (Closed System Drain right hip accordion (Hermovac); Incision right hip; Negative  pressure wound theapy right hip )                       Personal Care Assistance Level of Assistance  Bathing, Feeding, Dressing Bathing Assistance: Limited assistance Feeding assistance: Independent Dressing Assistance: Limited assistance     Functional Limitations Info  Sight, Hearing, Speech Sight Info: Adequate Hearing Info: Adequate Speech Info: Adequate    SPECIAL CARE FACTORS FREQUENCY  PT (By licensed PT), OT (By licensed OT)     PT Frequency:  (2-3) OT Frequency:  (2-3)            Contractures      Additional Factors Info  Code Status, Allergies Code Status Info:  (Full Code) Allergies Info:  (Atorvastatin, Codeine, Morphine)           Current Medications (08/05/2015):  This is the current hospital active medication list Current Facility-Administered Medications  Medication Dose Route Frequency Provider Last Rate Last Dose  . 0.9 %  sodium chloride infusion   Intravenous Continuous Kennedy Bucker, MD 100 mL/hr at 08/05/15 1102    . acetaminophen (TYLENOL) tablet 650 mg  650 mg Oral Q6H PRN Kennedy Bucker, MD       Or  . acetaminophen (TYLENOL) suppository 650 mg  650 mg Rectal Q6H PRN Kennedy Bucker, MD      . alum & mag hydroxide-simeth (MAALOX/MYLANTA) 200-200-20 MG/5ML suspension 30 mL  30 mL Oral Q4H PRN Kennedy Bucker, MD      . bisacodyl (DULCOLAX) EC tablet 5 mg  5 mg Oral Daily PRN Kennedy Bucker, MD      . buPROPion [facility] SR) 12 hr  tablet 150 mg  150 mg Oral Daily Kennedy Bucker, MD   150 mg at 08/05/15 0845  . dicyclomine (BENTYL) capsule 10 mg  10 mg Oral Q6H Kennedy Bucker, MD   10 mg at 08/05/15 1313  . diphenhydrAMINE (BENADRYL) 12.5 MG/5ML elixir 12.5-25 mg  12.5-25 mg Oral Q4H PRN Kennedy Bucker, MD      . docusate sodium (COLACE) capsule 100 mg  100 mg Oral BID Kennedy Bucker, MD   100 mg at 08/05/15 0845  . donepezil (ARICEPT) tablet 10 mg  10 mg Oral QHS Kennedy Bucker, MD   10 mg at 08/04/15 2122  . enoxaparin (LOVENOX) injection 40 mg  40 mg  Subcutaneous Q24H Kennedy Bucker, MD   40 mg at 08/05/15 0844  . famotidine (PEPCID) tablet 20 mg  20 mg Oral BID Kennedy Bucker, MD   20 mg at 08/05/15 0845  . FLUoxetine (PROZAC) capsule 20 mg  20 mg Oral Daily Kennedy Bucker, MD   20 mg at 08/05/15 0845  . HYDROmorphone (DILAUDID) injection 0.5 mg  0.5 mg Intravenous Q2H PRN Kennedy Bucker, MD   0.5 mg at 08/05/15 1135  . magnesium citrate solution 1 Bottle  1 Bottle Oral Once PRN Kennedy Bucker, MD      . magnesium hydroxide (MILK OF MAGNESIA) suspension 30 mL  30 mL Oral Daily PRN Kennedy Bucker, MD      . menthol-cetylpyridinium (CEPACOL) lozenge 3 mg  1 lozenge Oral PRN Kennedy Bucker, MD       Or  . phenol (CHLORASEPTIC) mouth spray 1 spray  1 spray Mouth/Throat PRN Kennedy Bucker, MD      . methocarbamol (ROBAXIN) tablet 500 mg  500 mg Oral Q6H PRN Kennedy Bucker, MD       Or  . methocarbamol (ROBAXIN) 500 mg in dextrose 5 % 50 mL IVPB  500 mg Intravenous Q6H PRN Kennedy Bucker, MD      . metoCLOPramide (REGLAN) tablet 5-10 mg  5-10 mg Oral Q8H PRN Kennedy Bucker, MD       Or  . metoCLOPramide (REGLAN) injection 5-10 mg  5-10 mg Intravenous Q8H PRN Kennedy Bucker, MD   5 mg at 08/04/15 1429  . nicotine (NICODERM CQ - dosed in mg/24 hr) patch 7 mg  7 mg Transdermal Daily Kennedy Bucker, MD   7 mg at 08/04/15 1313  . ondansetron (ZOFRAN) tablet 4 mg  4 mg Oral Q6H PRN Kennedy Bucker, MD       Or  . ondansetron [facility]) injection 4 mg  4 mg Intravenous Q6H PRN Kennedy Bucker, MD   4 mg at 08/05/15 1313  . oxyCODONE (Oxy IR/ROXICODONE) immediate release tablet 5-10 mg  5-10 mg Oral Q3H PRN Kennedy Bucker, MD   10 mg at 08/05/15 1635  . pantoprazole (PROTONIX) EC tablet 80 mg  80 mg Oral Daily Kennedy Bucker, MD   80 mg at 08/05/15 0846  . QUEtiapine (SEROQUEL) tablet 25 mg  25 mg Oral QHS Kennedy Bucker, MD   25 mg at 08/04/15 2122  . simvastatin (ZOCOR) tablet 40 mg  40 mg Oral QHS Kennedy Bucker, MD   40 mg at 08/04/15 2122  . vitamin B-12 (CYANOCOBALAMIN) tablet 1,000 mcg   1,000 mcg Oral Daily Kennedy Bucker, MD   1,000 mcg at 08/05/15 0845  . zolpidem (AMBIEN) tablet 5 mg  5 mg Oral QHS PRN Kennedy Bucker, MD         Discharge Medications: Please see discharge summary for a  list of discharge medications.  Relevant Imaging Results:  Relevant Lab Results:   Additional Information  (SSN [id number])  Verta Ellenhristina E Pilot Prindle, LCSW

## 2015-08-05 NOTE — Clinical Social Work Note (Addendum)
Clinical Social Work Assessment  Patient Details  Name: [name] MRN: [id number] Date of Birth: 1945/04/07  Date of referral:  08/05/15               Reason for consult:  Discharge Planning                Permission sought to share information with:  Family Supports Permission granted to share information::  Yes, Verbal Permission Granted  Name::        Agency::     Relationship::   [name]- Daughter )  Contact Information:     Housing/Transportation Living arrangements for the past 2 months:  [place] ([facility]) Source of Information:  Patient, Adult Children Patient Interpreter Needed:  None Criminal Activity/Legal Involvement Pertinent to Current Situation/Hospitalization:  No - Comment as needed Significant Relationships:  Adult Children, Other Family Members, Friend Lives with:  Facility Resident ([facility]) Do you feel safe going back to the place where you live?  Yes Need for family participation in patient care:  Yes (Comment) [name]- Daughter)  Care giving concerns:  Patient is from [place].    Social Worker assessment / plan:  CSW was consulted due to patient being from [place]. CSW met with patient at bedside, her daughter [name] called while CSW was speaking to patient. Patient reports she from [facility] and would like to return at discharge. She stated she'd like to get her therapy there too. Per patient's daughter she feels that patient may need SNF placement for rehab. Stated that patient's MD at [facility] suggested she received rehab from a SNF before returning there. Patient stated that she'd think about doing rehab at a SNF. Patient's daughter requested CSW to complete SNF search and that they would discuss discharge plans more at length this evening. Patient agreed. CSW provided patient and her daughter her cell phone number. CSW will re-visit patient tomorrow 08/06/15 to determine what patient would  like to do at discharge. FL2 completed as if patient will return to [facility] ,and referral faxed to SNFs in [facility]. Patient's PASRR will need to be updated if patient go to SNF at discharge. PASRR will not be received over the weekend. Awaiting bed offers. CSW will continue to follow and assist.   Employment status:  Retired Forensic scientist:  Medicare PT Recommendations:  Home with [place] / Referral to community resources:  [place]  Patient/Family's Response to care:  Patient reports that she wants to return to [facility] but is open to going to rehab. She would like to discuss options with her daughter [name].   Patient/Family's Understanding of and Emotional Response to Diagnosis, Current Treatment, and Prognosis:  Patient and her daughter understand why patient was admitted into the hospital and wants the best care for patient. Patient and her daughter are appreciative of CSW's assistance.   Emotional Assessment Appearance:  Appears stated age Attitude/Demeanor/Rapport:   (None) Affect (typically observed):  Calm, Pleasant, Accepting Orientation:  Oriented to [place], Oriented to Place, Oriented to  Time, Oriented to Situation Alcohol / Substance use:  Not Applicable Psych involvement (Current and /or in the community):  No (Comment)  Discharge Needs  Concerns to be addressed:  Discharge Planning Concerns Readmission within the last 30 days:  No Current discharge risk:  Chronically ill Barriers to Discharge:  Continued Medical Work up   [employer], LCSW 08/05/2015, 4:24 PM

## 2015-08-05 NOTE — Evaluation (Addendum)
Occupational Therapy Evaluation Patient Details Name: [name] MRN: [id number] DOB: 1945/10/05 Today's Date: 08/05/2015    History of Present Illness Pt. is a 70 y.o. female who was admitted to [facility] for a Right THR without reported post-op complications.    Clinical Impression   Pt. Is a 70 y.o. female who was admitted to [facility] for a RIght THR Anterior approach). Pt. Presents with pain, nausea, limited ROM, weakness, limited activity tolerance, hip precautions, and impaired functional mobility for ADLs which hinder her ability to complete ADL and IADL functioning. Pt. Could benefit from skilled OT services for ADL training, A/E training, work simplification strategies, review of A/E,  and functional mobility for ADLs in order to improve ADL and IADL functioning.     Follow Up Recommendations  Home health OT    Equipment Recommendations       Recommendations for Other Services       Precautions / Restrictions Precautions Precautions: Anterior Hip Precaution Booklet Issued: Yes (comment) Restrictions Weight Bearing Restrictions: Yes RLE Weight Bearing: Weight bearing as tolerated      Mobility Bed Mobility  Bed Mobility: Supine to Sit     Supine to sit: Mod assist     General bed mobility comments: Pt. Requires assist from the trapeze bar.  Transfers Overall transfer level: Needs assistance Equipment used: Rolling walker (2 wheeled) Transfers: Sit to/from Stand Sit to Stand: Min guard              Balance Overall balance assessment: Needs assistance Sitting-balance support: No upper extremity supported Sitting balance-Leahy Scale: Good       Standing balance-Leahy Scale: Poor                              ADL Overall ADL's : Needs assistance/impaired Eating/Feeding: Independent;Set up                   Lower Body Dressing: Maximal assistance   Toilet Transfer: Min guard (With cues for hand placement.)  Toilet hygiene: Min  guard              Pt. Education was provided about A/E use, and anterior hip precautions.     Vision     Perception     Praxis      Pertinent Vitals/Pain Pain Assessment: 0-10 Pain Score: 8  Pain Location: Right Hip Pain Intervention(s): Limited activity within patient's tolerance;Repositioned     Hand Dominance Left   Extremity/Trunk Assessment Upper Extremity Assessment Upper Extremity Assessment: Overall WFL for tasks assessed           Communication Communication Communication: No difficulties   Cognition Arousal/Alertness: Awake/alert Behavior During Therapy: WFL for tasks assessed/performed Pt. Requires cues and encouragement to participate. Overall Cognitive Status: Within Functional Limits for tasks assessed                     General Comments       Exercises     Shoulder Instructions      Home Living Family/patient expects to be discharged to:: Assisted living                             Home Equipment: Walker - 4 wheels;Shower seat;Grab bars - tub/shower   Additional Comments: Pt lives at [facility]      Prior Functioning/Environment Level of Independence: Needs assistance  Gait /  Transfers Assistance Needed: Pt reports facility ambulation with rollator ADL's / Homemaking Assistance Needed: Independent with ADLs, requires assist for IADLs, no driving.        OT Diagnosis: Generalized weakness;Acute pain   OT Problem List:     OT Treatment/Interventions:      OT Goals(Current goals can be found in the care plan section) Acute Rehab OT Goals Patient Stated Goal: To decrease pain. OT Goal Formulation: With patient  OT Frequency:     Barriers to D/C:            Co-evaluation              End of Session Equipment Utilized During Treatment: Gait belt  Activity Tolerance: Patient tolerated treatment well (Nausea) Patient left: in bed;with call bell/phone within reach;with bed alarm set   Time:  1255-1325 OT Time Calculation (min): 30 min Charges:  OT Evaluation $OT Eval Low Complexity: 1 Procedure $OT Eval Moderate Complexity: 1 Procedure OT Treatments $[place] Care/Home Management : 8-22 mins G-Codes:    Olegario MessierElaine Lester Crickenberger, MS, OTR/L Olegario MessierElaine Damien Batty 08/05/2015, 1:56 PM

## 2015-08-05 NOTE — Progress Notes (Signed)
Physical Therapy Treatment Patient Details Name: [name] MRN: [id number] DOB: November 27, 1945 Today's Date: 08/05/2015    History of Present Illness Pt. is a 70 y.o. female who was admitted to [facility] for a Right THR without reported post-op complications.     PT Comments    Pt continues to be hesitant to work with PT often stating that she needs to sleep (attempted at least 4 PT sessions today with mixed results).  She is able to participate well when she is willing, but overall needs a lot of encouragement and explanation to agree to really do a lot with PT.  Pt was able to increase ambulation distance this afternoon and showed some increased ability to exercises. Unsure, given her current progress, if she will be able to return to [facility] or will need short term rehab.  Disposition will be per progress.   Follow Up Recommendations  SNF     Equipment Recommendations  None recommended by PT    Recommendations for Other Services       Precautions / Restrictions Precautions Precautions: Anterior Hip Precaution Booklet Issued: Yes (comment) Restrictions Weight Bearing Restrictions: Yes RLE Weight Bearing: Weight bearing as tolerated    Mobility  Bed Mobility Overal bed mobility: Modified Independent Bed Mobility: Supine to Sit     Supine to sit: Mod assist     General bed mobility comments: Pt in recliner at start and end of PT  Transfers Overall transfer level: Needs assistance Equipment used: Rolling walker (2 wheeled) Transfers: Sit to/from Stand Sit to Stand: Min guard         General transfer comment: Pt continues to need cuing for set up and hand placement, overall she is safe but does have some lack of focus/confusion needing extra assist.  Ambulation/Gait Ambulation/Gait assistance: Min assist Ambulation Distance (Feet): 40 Feet         General Gait Details: Pt more willing to ambulate this afternoon, but clearly did become fatigued and had some  fear of falling.  Pt did not have any losses of balance but did have some inconsistent steps needing more cuing than this AM.   Stairs            Wheelchair Mobility    Modified Rankin (Stroke Patients Only)       Balance Overall balance assessment: Needs assistance Sitting-balance support: No upper extremity supported Sitting balance-Leahy Scale: Good       Standing balance-Leahy Scale: Poor                      Cognition Arousal/Alertness: Awake/alert Behavior During Therapy: Restless Overall Cognitive Status: Difficult to assess                      Exercises Total Joint Exercises Ankle Circles/Pumps: AROM;10 reps Quad Sets: Strengthening;Both;10 reps;Supine Gluteal Sets: Strengthening;Both;10 reps;Supine Heel Slides: 10 reps;AROM Hip ABduction/ADduction: AROM;10 reps;Strengthening Long Arc Quad: Strengthening;10 reps Marching in Standing: Seated;10 reps;AROM    General Comments        Pertinent Vitals/Pain Pain Assessment: 0-10 Pain Score: 5  Pain Location: Right Hip Pain Intervention(s): Limited activity within patient's tolerance;Repositioned    Home Living Family/patient expects to be discharged to:: Assisted living             Home Equipment: Walker - 4 wheels;Shower seat;Grab bars - tub/shower Additional Comments: Pt lives at [facility]    Prior Function Level of Independence: Needs assistance  Gait /  Transfers Assistance Needed: Pt reports facility ambulation with rollator ADL's / Homemaking Assistance Needed: Independent with ADLs, requires assist for IADLs, no driving.     PT Goals (current goals can now be found in the care plan section) Acute Rehab PT Goals Patient Stated Goal: To decrease pain. Progress towards PT goals: Progressing toward goals    Frequency  BID    PT Plan Discharge plan needs to be updated    Co-evaluation             End of Session Equipment Utilized During Treatment: Gait  belt Activity Tolerance: Patient tolerated treatment well Patient left: in chair;with call bell/phone within reach;with chair alarm set;Other (comment);with SCD's reapplied     Time: [id number] PT Time Calculation (min) (ACUTE ONLY): 31 min  Charges:  $Gait Training: 8-22 mins $Therapeutic Exercise: 8-22 mins                    G Codes:      Malachi ProGalen R Nahzir Pohle, DPT  08/05/2015, 4:45 PM

## 2015-08-05 NOTE — Progress Notes (Signed)
Pt up in the chair.  Complaining of nausea but refused medication at this time. Will continue to monitor the pt,.

## 2015-08-06 LAB — CBC
HEMATOCRIT: 31.6 % — AB (ref 35.0–47.0)
HEMOGLOBIN: 10.7 g/dL — AB (ref 12.0–16.0)
MCH: 32.1 pg (ref 26.0–34.0)
MCHC: 33.8 g/dL (ref 32.0–36.0)
MCV: 94.9 fL (ref 80.0–100.0)
Platelets: 158 10*3/uL (ref 150–440)
RBC: 3.33 MIL/uL — AB (ref 3.80–5.20)
RDW: 13.8 % (ref 11.5–14.5)
WBC: 9.4 10*3/uL (ref 3.6–11.0)

## 2015-08-06 LAB — BASIC METABOLIC PANEL
ANION GAP: 5 (ref 5–15)
BUN: 10 mg/dL (ref 6–20)
CHLORIDE: 107 mmol/L (ref 101–111)
CO2: 28 mmol/L (ref 22–32)
Calcium: 8.6 mg/dL — ABNORMAL LOW (ref 8.9–10.3)
Creatinine, Ser: 0.63 mg/dL (ref 0.44–1.00)
GFR calc non Af Amer: >60 mL/min (ref >60)
Glucose, Bld: 93 mg/dL (ref 65–99)
Potassium: 3.7 mmol/L (ref 3.5–5.1)
SODIUM: 140 mmol/L (ref 135–145)

## 2015-08-06 NOTE — Progress Notes (Signed)
Occupational Therapy Treatment Patient Details Name: Laurie LollLinda S Blazejewski MRN: [id number] DOB: [birth year] Today's Date: 08/06/2015    History of present illness Pt. is a 70 y.o. female who was admitted to [facility] for a Right THR without reported post-op complications.    OT comments  Patient was lying supine in the bed when OT arrived. Stated she received pain medication prior to session and pain was 3/10, but she was experiencing nausea. Patient able to participate in treatment, but frequent breaks and cuing for deep breathing needed throughout. Patient kept emesis bag nearby, but no emesis produced during session. Patient able to perform lower body dressing while sitting edge of bed with encouragement, AE, and cues for safety/use. Patient able to perform toilet transfer to bedside commode using rolling walker. Required MIN Guard and verbal cues for safety/sequencing. [facility] better than initial transfer.    Follow Up Recommendations       Equipment Recommendations       Recommendations for Other Services      Precautions / Restrictions Precautions Precautions: Anterior Hip Precaution Booklet Issued: Yes (comment) Restrictions Weight Bearing Restrictions: Yes RLE Weight Bearing: Weight bearing as tolerated       Mobility Bed Mobility Overal bed mobility: Needs Assistance Bed Mobility: Sit to Supine     Supine to sit: Min assist Sit to supine: Min assist   General bed mobility comments: Pt showed good effort in trying to get LEs up into the bed and does not need excessive assist   Transfers Overall transfer level: Needs assistance Equipment used: Rolling walker (2 wheeled) Transfers: Sit to/from Stand Sit to Stand: Min guard         General transfer comment: Pt is somewhat impulsive with trying to get to standing.  She is safe once up but does need cuing for set up, sequencing and safety.    Balance                                    ADL Overall ADL's : Needs assistance/impaired                     Lower Body Dressing: Maximal assistance;With adaptive equipment;Cueing for compensatory techniques;Bed level   Toilet Transfer: Min guard;Cueing for safety;BSC   Toileting- ArchitectClothing Manipulation and Hygiene: Minimal assistance                Vision                     Perception     Praxis      Cognition   Behavior During Therapy: Anxious (Patient stated had nausea, and was very focused on managing nausea during session.) Overall Cognitive Status: Within Functional Limits for tasks assessed                       Extremity/Trunk Assessment               Exercises Total Joint Exercises Ankle Circles/Pumps: AROM;10 reps Quad Sets: Strengthening;Both;10 reps;Supine Gluteal Sets: Strengthening;Both;10 reps;Supine Short Arc Quad: AROM;10 reps Heel Slides: 10 reps;AROM Hip ABduction/ADduction: AROM;10 reps;Strengthening   Shoulder Instructions       General Comments      Pertinent Vitals/ Pain       Pain Assessment: 0-10 Pain Score: 3  Pain Location: Right hip Pain Intervention(s): Limited activity within  patient's tolerance;Monitored during session  Home Living                                          Prior Functioning/Environment              Frequency       Progress Toward Goals  OT Goals(current goals can now be found in the care plan section)  Progress towards OT goals: Progressing toward goals  Acute Rehab OT Goals Patient Stated Goal: To decrease pain. OT Goal Formulation: With patient  Plan Discharge plan remains appropriate    Co-evaluation                 End of Session Equipment Utilized During Treatment: Gait belt;Rolling walker   Activity Tolerance Patient tolerated treatment well;Other (comment) (Limited by Nausea)   Patient Left in bed;with call bell/phone within reach;with bed alarm set   Nurse  Communication          Time: 1020-1045 OT Time Calculation (min): 25 min  Charges: OT General Charges $OT Visit: 1 Procedure OT Treatments $[place] Care/Home Management : 23-37 mins  Mikel Pyon L 08/06/2015, 12:51 PM  Kirstie Peri, OTR/L

## 2015-08-06 NOTE — Progress Notes (Signed)
Physical Therapy Treatment Patient Details Name: [name] MRN: [id number] DOB: 1945-04-12 Today's Date: 08/06/2015    History of Present Illness Pt. is a 70 y.o. female who was [facility] for a Right THR without reported post-op complications.     PT Comments    Pt does her best bout of ambulation yet.  She is still slow and hesitant, but is much more consistent with cadence and shows less fatigue and relatively increased confidence this session.  She does not c/o of as much pain at rest or with activity, though clearly she is still uncomfortable with most activities.   Follow Up Recommendations  SNF     Equipment Recommendations  None recommended by PT    Recommendations for Other Services       Precautions / Restrictions Precautions Precautions: Anterior Hip Precaution Booklet Issued: Yes (comment) Restrictions Weight Bearing Restrictions: Yes RLE Weight Bearing: Weight bearing as tolerated    Mobility  Bed Mobility Overal bed mobility: Needs Assistance Bed Mobility: Supine to Sit;Sit to Supine     Supine to sit: Min assist Sit to supine: Min assist;Min guard   General bed mobility comments: Pt nearly getting in/out of bed w/o assist, still needing guarding with occasional light assist  Transfers Overall transfer level: Needs assistance Equipment used: Rolling walker (2 wheeled) Transfers: Sit to/from Stand Sit to Stand: Min guard         General transfer comment: Pt more steady and safety aware with getting to standing this session  Ambulation/Gait Ambulation/Gait assistance: Min guard Ambulation Distance (Feet): 75 Feet Assistive device: Rolling walker (2 wheeled)       General Gait Details: Pt with more consistent cadence and less apparent favoring of the R LE.  She is still very reliant on the walker but is able to put more consistent weight on the R and maintain a faster pace (though still slow and hesitant)   Stairs             Wheelchair Mobility    Modified Rankin (Stroke Patients Only)       Balance                                    Cognition Arousal/Alertness: Awake/alert Behavior During Therapy: Anxious Overall Cognitive Status: Within Functional Limits for tasks assessed                      Exercises Total Joint Exercises Ankle Circles/Pumps: AROM;10 reps Quad Sets: Strengthening;Both;10 reps;Supine Gluteal Sets: Strengthening;Both;10 reps;Supine Short Arc Quad: AROM;15 reps Heel Slides: 10 reps;AROM Hip ABduction/ADduction: AROM;Strengthening;15 reps Straight Leg Raises: 10 reps;Supine;AAROM    General Comments        Pertinent Vitals/Pain Pain Assessment: 0-10 Pain Score: 6  Pain Location: Right hip Pain Intervention(s): Limited activity within patient's tolerance;Monitored during session    Home Living                      Prior Function            PT Goals (current goals can now be found in the care plan section) Acute Rehab PT Goals Patient Stated Goal: To decrease pain. Progress towards PT goals: Progressing toward goals    Frequency  BID    PT Plan Current plan remains appropriate    Co-evaluation  End of Session Equipment Utilized During Treatment: Gait belt Activity Tolerance: Patient tolerated treatment well Patient left: with bed alarm set;with call bell/phone within reach     Time: 4098-1191 PT Time Calculation (min) (ACUTE ONLY): 26 min  Charges:  $Gait Training: 8-22 mins $Therapeutic Exercise: 8-22 mins                    G Codes:      Malachi Pro, DPT  08/06/2015, 3:28 PM

## 2015-08-06 NOTE — Progress Notes (Signed)
   Subjective: 2 Days Post-Op Procedure(s) (LRB): TOTAL HIP ARTHROPLASTY ANTERIOR APPROACH (Right) Patient reports pain as mild.   Patient is well, and has had no acute complaints or problems Denies any CP, SOB, ABD pain. We will continue therapy today.  Plan is to go Rehab after hospital stay.  Objective: Vital signs in last 24 hours: Temp:  [97.4 F (36.3 C)-98.8 F (37.1 C)] 98.6 F (37 C) (06/10 0721) Pulse Rate:  [64-80] 80 (06/10 0721) Resp:  [16-18] 16 (06/10 0721) BP: (118-128)/(51-60) 128/60 mmHg (06/10 0721) SpO2:  [91 %-99 %] 91 % (06/10 0721)  Intake/Output from previous day: 06/09 0701 - 06/10 0700 In: -  Out: 1120 [Urine:900; Drains:220] Intake/Output this shift:     Recent Labs  08/04/15 0729 08/05/15 0448 08/06/15 0244  HGB 13.4 11.3* 10.7*    Recent Labs  08/05/15 0448 08/06/15 0244  WBC 14.0* 9.4  RBC 3.62* 3.33*  HCT 34.2* 31.6*  PLT 174 158    Recent Labs  08/05/15 0448 08/06/15 0244  NA 140 140  K 4.0 3.7  CL 108 107  CO2 26 28  BUN 9 10  CREATININE 0.63 0.63  GLUCOSE 122* 93  CALCIUM 8.6* 8.6*   No results for input(s): LABPT, INR in the last 72 hours.  EXAM General - Patient is Alert, Appropriate and Oriented Extremity - Neurovascular intact Sensation intact distally Intact pulses distally Dorsiflexion/Plantar flexion intact No cellulitis present Dressing - dressing C/D/I and wound vac intact, hemovac removed Motor Function - intact, moving foot and toes well on exam.   Past Medical History  Diagnosis Date  . ALCOHOL ABUSE, HX OF   . ANEMIA-NOS   . ANXIETY   . DEGENERATIVE DISC DISEASE, CERVICAL SPINE, W/RADICULOPATHY   . DEGENERATIVE DISC DISEASE, CERVICAL SPINE   . Depressive disorder, not elsewhere classified   . Depressive type psychosis (HCC)   . DIVERTICULITIS, HX OF   . DIZZINESS   . GERD   . HYPERLIPIDEMIA   . HYPERTENSION   . Insomnia, unspecified   . LOW BACK PAIN   . Memory loss   . MIGRAINE  HEADACHE   . WEIGHT LOSS   . Palpitation   . CERVICAL RADICULOPATHY, LEFT     Assessment/Plan:   2 Days Post-Op Procedure(s) (LRB): TOTAL HIP ARTHROPLASTY ANTERIOR APPROACH (Right) Active Problems:   Primary osteoarthritis of right hip  Estimated body mass index is 31.97 kg/(m^2) as calculated from the following:   Height as of this encounter: 5\' 6"  (1.676 m).   Weight as of this encounter: 89.812 kg (198 lb). Advance diet Up with therapy  Needs BM  Plan on discharge to SNF Sunday or Monday    DVT Prophylaxis - Lovenox, Foot Pumps and TED hose Weight-Bearing as tolerated to right leg D/C O2 and Pulse OX and try on Room Air  T. Cranston Neighborhris Kamarie Palma, PA-C [facility] 08/06/2015, 8:17 AM

## 2015-08-06 NOTE — Progress Notes (Signed)
CSW spoke to patient's daughter Jasmine DecemberSharon. Per Jasmine DecemberSharon she's patient's Legal Guardian and Health Care Power of [place]. She reports that she makes decisions for patient. Reported that patient's MD and [facility] at [facility] feels it's best for patient to go to SNF. Patient's daughter agreed. Reported that she's going to speak to patient about SNF placement today. Pref. [facility]. CSW sent SNF referral yesterday. Awaiting bed offers. CSW submitted to  Must to change patient's PASRR number. It has to be manually reviewed. Placed 30 day note on patient's chart to be signed by MD. Awaiting PASRR number. CSW will continue to follow and assist.  Woodroe Modehristina Chauntay Paszkiewicz, MSW, LCSW-A Clinical Social Work Department [phone]

## 2015-08-06 NOTE — Progress Notes (Signed)
Physical Therapy Treatment Patient Details Name: [name] MRN: [id number] DOB: [birth year] Today's Date: 08/06/2015    History of Present Illness Pt. is a 70 y.o. female who was admitted to [facility] for a Right THR without reported post-op complications.     PT Comments    Pt continues to struggle with being fully motivated to work with PT.  She needs a lot of encouragement and explanation but did do bed exercises and was able to increase her walking distance minimally.  Pt continues to reports pain and nausea and needs a lot of cuing to remain on task. Pt with minimal pain initially (2 or 3/10) but reports increased pain with any activity.  Follow Up Recommendations  SNF     Equipment Recommendations  None recommended by PT    Recommendations for Other Services       Precautions / Restrictions Precautions Precautions: Anterior Hip Restrictions Weight Bearing Restrictions: Yes RLE Weight Bearing: Weight bearing as tolerated    Mobility  Bed Mobility Overal bed mobility: Needs Assistance Bed Mobility: Sit to Supine       Sit to supine: Min assist   General bed mobility comments: Pt showed good effort in trying to get LEs up into the bed and does not need excessive assist   Transfers Overall transfer level: Needs assistance Equipment used: Rolling walker (2 wheeled) Transfers: Sit to/from Stand Sit to Stand: Min guard         General transfer comment: Pt is somewhat impulsive with trying to get to standing.  She is safe once up but does need cuing for set up, sequencing and safety.  Ambulation/Gait Ambulation/Gait assistance: Min assist Ambulation Distance (Feet): 50 Feet Assistive device: Rolling walker (2 wheeled)       General Gait Details: Pt is slow and hesitant with ambulation and c/o some nausea t/o the effort.  She has slow, cautious gait and reports increased pain with ambualtion but appears more limited secondary to nausea   Stairs             Wheelchair Mobility    Modified Rankin (Stroke Patients Only)       Balance                                    Cognition Arousal/Alertness: Awake/alert Behavior During Therapy: Restless Overall Cognitive Status: Difficult to assess                      Exercises Total Joint Exercises Ankle Circles/Pumps: AROM;10 reps Quad Sets: Strengthening;Both;10 reps;Supine Gluteal Sets: Strengthening;Both;10 reps;Supine Short Arc Quad: AROM;10 reps Heel Slides: 10 reps;AROM Hip ABduction/ADduction: AROM;10 reps;Strengthening    General Comments        Pertinent Vitals/Pain Pain Score: 6     Home Living                      Prior Function            PT Goals (current goals can now be found in the care plan section) Progress towards PT goals: Progressing toward goals    Frequency  BID    PT Plan Current plan remains appropriate    Co-evaluation             End of Session Equipment Utilized During Treatment: Gait belt Activity Tolerance: Patient tolerated treatment well Patient left: with bed alarm set;with call  bell/phone within reach     Time: 0843-0907 PT Time Calculation (min) (ACUTE ONLY): 24 min  Charges:  $Gait Training: 8-22 mins $Therapeutic Exercise: 8-22 mins                    G Codes:      Malachi ProGalen R Lorrin Nawrot, DPT  08/06/2015, 12:35 PM

## 2015-08-07 MED ORDER — ENOXAPARIN SODIUM 40 MG/0.4ML ~~LOC~~ SOLN: 40.0000 mg | SUBCUTANEOUS | Status: DC

## 2015-08-07 MED ORDER — BISACODYL 10 MG RE SUPP: 10.0000 mg | Freq: Every day | RECTAL | Status: DC | PRN

## 2015-08-07 MED ORDER — OXYCODONE HCL 5 MG PO TABS: 5.0000 mg | ORAL_TABLET | ORAL | Status: DC | PRN

## 2015-08-07 NOTE — Progress Notes (Signed)
Physical Therapy Treatment Patient Details Name: Laurie LollLinda S Noboa MRN: [id number] DOB: 1945/06/23 Today's Date: 08/07/2015    History of Present Illness Pt. is a 70 y.o. female who [facility] Right [facility] without reported post-op complications.     PT Comments    Pt continues to need encouragement to initially agree to participate, but is doing better with each session with following cues, AROM and strength with R LE exercises and has been able to gradually increase ambulation distance though she still needs a lot of cuing to avoid guarded, short, choppy steps.   Follow Up Recommendations  SNF     Equipment Recommendations  None recommended by PT    Recommendations for Other Services       Precautions / Restrictions Precautions Precautions: Anterior Hip Restrictions RLE Weight Bearing: Weight bearing as tolerated    Mobility  Bed Mobility Overal bed mobility: Needs Assistance Bed Mobility: Supine to Sit     Supine to sit: Min assist     General bed mobility comments: Pt shows good effort and abiltiy to use UEs to get to EOB, needs only minimal cuing and light assist  Transfers Overall transfer level: Modified independent Equipment used: Rolling walker (2 wheeled) Transfers: Sit to/from Stand Sit to Stand: Supervision         General transfer comment: Pt showing increased confidence with getting to standing using walker and UEs more appropriately only the hand rails  Ambulation/Gait Ambulation/Gait assistance: Min guard Ambulation Distance (Feet): 100 Feet Assistive device: Rolling walker (2 wheeled)       General Gait Details: Pt again with slow, choppy cadence, but with cuing and direct assist to advance walker she is able to increase speed and cadence for brief bouts of ambulation.  Overall pt still hesitant but is safe t/o the effort.    Stairs            Wheelchair Mobility    Modified Rankin (Stroke Patients Only)       Balance                                     Cognition Arousal/Alertness: Awake/alert Behavior During Therapy: WFL for tasks assessed/performed Overall Cognitive Status: Within Functional Limits for tasks assessed                      Exercises Total Joint Exercises Ankle Circles/Pumps: AROM;10 reps Quad Sets: Strengthening;Both;10 reps;Supine Gluteal Sets: Strengthening;Both;10 reps;Supine Towel Squeeze: Strengthening;Both;10 reps;Supine Short Arc Quad: AROM;15 reps Heel Slides: 10 reps;AROM Hip ABduction/ADduction: AROM;Strengthening;15 reps Straight Leg Raises: 10 reps;Supine;AAROM    General Comments        Pertinent Vitals/Pain Pain Score: 3     Home Living                      Prior Function            PT Goals (current goals can now be found in the care plan section) Progress towards PT goals: Progressing toward goals    Frequency  BID    PT Plan Current plan remains appropriate    Co-evaluation             End of Session Equipment Utilized During Treatment: Gait belt Activity Tolerance: Patient tolerated treatment well Patient left: with call bell/phone within reach;with chair alarm set     Time:  9147-8295 PT Time Calculation (min) (ACUTE ONLY): 25 min  Charges:  $Gait Training: 8-22 mins $Therapeutic Exercise: 8-22 mins                    G Codes:      Malachi Pro, DPT  08/07/2015, 12:20 PM

## 2015-08-07 NOTE — Progress Notes (Signed)
   Subjective: 3 Days Post-Op Procedure(s) (LRB): TOTAL HIP ARTHROPLASTY ANTERIOR APPROACH (Right) Patient reports pain as mild.   Patient is well, and has had no acute complaints or problems Denies any CP, SOB, ABD pain. We will continue therapy today.  Plan is to go Rehab after hospital stay.  Objective: Vital signs in last 24 hours: Temp:  [97.4 F (36.3 C)-99 F (37.2 C)] 98 F (36.7 C) (06/11 0732) Pulse Rate:  [77-87] 82 (06/11 0732) Resp:  [14-18] 18 (06/11 0732) BP: (138-155)/(65-82) 147/82 mmHg (06/11 0732) SpO2:  [92 %-100 %] 94 % (06/11 0732)  Intake/Output from previous day: 06/10 0701 - 06/11 0700 In: 360 [P.O.:360] Out: 350 [Urine:350] Intake/Output this shift:     Recent Labs  08/05/15 0448 08/06/15 0244  HGB 11.3* 10.7*    Recent Labs  08/05/15 0448 08/06/15 0244  WBC 14.0* 9.4  RBC 3.62* 3.33*  HCT 34.2* 31.6*  PLT 174 158    Recent Labs  08/05/15 0448 08/06/15 0244  NA 140 140  K 4.0 3.7  CL 108 107  CO2 26 28  BUN 9 10  CREATININE 0.63 0.63  GLUCOSE 122* 93  CALCIUM 8.6* 8.6*   No results for input(s): LABPT, INR in the last 72 hours.  EXAM General - Patient is Alert, Appropriate and Oriented Extremity - Neurovascular intact Sensation intact distally Intact pulses distally Dorsiflexion/Plantar flexion intact No cellulitis present Dressing - dressing C/D/I, no drainage in woundvac canister Motor Function - intact, moving foot and toes well on exam.   Past Medical History  Diagnosis Date  . ALCOHOL ABUSE, HX OF   . ANEMIA-NOS   . ANXIETY   . DEGENERATIVE DISC DISEASE, CERVICAL SPINE, W/RADICULOPATHY   . DEGENERATIVE DISC DISEASE, CERVICAL SPINE   . Depressive disorder, not elsewhere classified   . Depressive type psychosis (HCC)   . DIVERTICULITIS, HX OF   . DIZZINESS   . GERD   . HYPERLIPIDEMIA   . HYPERTENSION   . Insomnia, unspecified   . LOW BACK PAIN   . Memory loss   . MIGRAINE HEADACHE   . WEIGHT LOSS    . Palpitation   . CERVICAL RADICULOPATHY, LEFT     Assessment/Plan:   3 Days Post-Op Procedure(s) (LRB): TOTAL HIP ARTHROPLASTY ANTERIOR APPROACH (Right) Active Problems:   Primary osteoarthritis of right hip  Estimated body mass index is 31.97 kg/(m^2) as calculated from the following:   Height as of this encounter: 5\' 6"  (1.676 m).   Weight as of this encounter: 89.812 kg (198 lb). Advance diet Up with therapy  Needs BM before discharge Plan on discharge to SNF Sunday or Monday  Remove wound vac once battery dies (09/10/15) and apply honeycomb dressing over incision.  Follow up with [facility] in 2 weeks for staple removal.   DVT Prophylaxis - Lovenox, Foot Pumps and TED hose Weight-Bearing as tolerated to right leg D/C O2 and Pulse OX and try on Room Air  T. Cranston Neighborhris Aleighya Mcanelly, PA-C [facility] 08/07/2015, 8:09 AM

## 2015-08-07 NOTE — Progress Notes (Signed)
Patient is A&O x4, but forgetful. Prevena vac in place to right hip, dressing intact. PPP. Up with assist x1 and [employer]. In chair during periods of the day. LBM, this shift. Gave pain med x1 with good relief. Up in chair doing hair and make-up this afternoon. Friends and family visits most of the day. Voiding on BSC in adequate amounts. Appetite good to fair at times. NSL in place.

## 2015-08-07 NOTE — Progress Notes (Signed)
Instructed patient multiple times about using call light and not getting up without staff.  Chair alarm is pulled in and bed alarm on at all times.

## 2015-08-07 NOTE — Care Management Important Message (Signed)
Important Message  Patient Details  Name: Laurie LollLinda S Bennis MRN: [id number] Date of Birth: August 10, 1945   Medicare Important Message Given:  Yes    Pleasant Britz A, RN 08/07/2015, 3:00 PM

## 2015-08-07 NOTE — Discharge Instructions (Signed)

## 2015-08-07 NOTE — Discharge Summary (Addendum)
Physician Discharge Summary  Patient ID: [name] MRN: [id number] DOB/AGE: 02-Jan-1946 70 y.o.  Admit date: 08/04/2015 Discharge date:  08/08/2015 Admission Diagnoses:  osteoarthritis   Discharge Diagnoses: Patient Active Problem List   Diagnosis Date Noted  . Primary osteoarthritis of right hip 08/04/2015  . Preventative health care 01/15/2011  . WEIGHT LOSS 06/09/2009  . Depressive type psychosis (HCC) 10/12/2008  . CERVICAL RADICULOPATHY, LEFT 10/12/2008  . DIZZINESS 09/29/2008  . DEGENERATIVE DISC DISEASE, CERVICAL SPINE, W/RADICULOPATHY 07/21/2008  . Memory loss 01/09/2008  . Depressive disorder, not elsewhere classified 07/16/2007  . ANEMIA-NOS 01/14/2007  . HYPERTENSION 01/14/2007  . DEGENERATIVE DISC DISEASE, CERVICAL SPINE 01/14/2007  . LOW BACK PAIN 01/14/2007  . HYPERLIPIDEMIA 10/25/2006  . MIGRAINE HEADACHE 10/25/2006  . Insomnia, unspecified 10/25/2006  . ALCOHOL ABUSE, HX OF 10/25/2006  . DIVERTICULITIS, HX OF 10/25/2006  . ANXIETY 09/19/2006  . GERD 09/19/2006    Past Medical History  Diagnosis Date  . ALCOHOL ABUSE, HX OF   . ANEMIA-NOS   . ANXIETY   . DEGENERATIVE DISC DISEASE, CERVICAL SPINE, W/RADICULOPATHY   . DEGENERATIVE DISC DISEASE, CERVICAL SPINE   . Depressive disorder, not elsewhere classified   . Depressive type psychosis (HCC)   . DIVERTICULITIS, HX OF   . DIZZINESS   . GERD   . HYPERLIPIDEMIA   . HYPERTENSION   . Insomnia, unspecified   . LOW BACK PAIN   . Memory loss   . MIGRAINE HEADACHE   . WEIGHT LOSS   . Palpitation   . CERVICAL RADICULOPATHY, LEFT      Transfusion: none   Consultants (if any):    Discharged Condition: Improved  Hospital Course: [name] is an 70 y.o. female who was admitted 08/04/2015 with a diagnosis of right hip osteoarthritis and went to the operating room on 08/04/2015 and underwent the above named procedures.    Surgeries: Procedure(s): TOTAL HIP ARTHROPLASTY ANTERIOR APPROACH on  08/04/2015 Patient tolerated the surgery well. Taken to PACU where she was stabilized and then transferred to the orthopedic floor.  Started on Lovenox 40 q 12 hrs. Foot pumps applied bilaterally at 80 mm. Heels elevated on bed with rolled towels. No evidence of DVT. Negative Homan. Physical therapy started on day #1 for gait training and transfer. OT started day #1 for ADL and assisted devices.  Patient's foley was d/c on day #1. Patient's IV was d/c on day #2.  On post op day #4 patient was stable and ready for discharge to SNF.  Implants: Medacta 48 mm Mpact cup DM with liner,AMIS 2 collared stem with S 28 mm head  She was given perioperative antibiotics:  Anti-infectives    Start     Dose/Rate Route Frequency Ordered Stop   08/04/15 1245  ceFAZolin (ANCEF) IVPB 2g/100 mL premix     2 g 200 mL/hr over 30 Minutes Intravenous Every 6 hours 08/04/15 1230 08/05/15 0312   08/04/15 0200  ceFAZolin (ANCEF) IVPB 2g/100 mL premix     2 g 200 mL/hr over 30 Minutes Intravenous  Once 08/04/15 0158 08/04/15 0941    .  She was given sequential compression devices, early ambulation, and lovenox for DVT prophylaxis.  She benefited maximally from the hospital stay and there were no complications.    Recent vital signs:  Filed Vitals:   08/07/15 0403 08/07/15 0732  BP: 138/71 147/82  Pulse: 87 82  Temp: 99 F (37.2 C) 98 F (36.7 C)  Resp: 16 18  Recent laboratory studies:  Lab Results  Component Value Date   HGB 10.7* 08/06/2015   HGB 11.3* 08/05/2015   HGB 13.4 08/04/2015   Lab Results  Component Value Date   WBC 9.4 08/06/2015   PLT 158 08/06/2015   Lab Results  Component Value Date   INR 1.03 07/18/2015   Lab Results  Component Value Date   NA 140 08/06/2015   K 3.7 08/06/2015   CL 107 08/06/2015   CO2 28 08/06/2015   BUN 10 08/06/2015   CREATININE 0.63 08/06/2015   GLUCOSE 93 08/06/2015    Discharge Medications:     Medication List    TAKE these medications         acetaminophen 325 MG tablet  Commonly known as:  TYLENOL  Take 650 mg by mouth every 6 (six) hours as needed.     ARICEPT 5 MG tablet  Generic drug:  donepezil  Take 10 mg by mouth at bedtime.     buPROPion 150 MG 12 hr tablet  Commonly known as:  WELLBUTRIN SR  Take 150 mg by mouth daily.     dicyclomine 10 MG capsule  Commonly known as:  BENTYL  Take 10 mg by mouth every 6 (six) hours.     enoxaparin 40 MG/0.4ML injection  Commonly known as:  LOVENOX  Inject 0.4 mLs (40 mg total) into the skin daily.     FLUoxetine 20 MG tablet  Commonly known as:  PROZAC  Take 20 mg by mouth daily.     meloxicam 7.5 MG tablet  Commonly known as:  MOBIC  Take 7.5 mg by mouth daily.     omeprazole 40 MG capsule  Commonly known as:  PRILOSEC  Take 40 mg by mouth daily.     oxyCODONE 5 MG immediate release tablet  Commonly known as:  Oxy IR/ROXICODONE  Take 1-2 tablets (5-10 mg total) by mouth every 3 (three) hours as needed for breakthrough pain.     QUEtiapine 25 MG tablet  Commonly known as:  SEROQUEL  Take 25 mg by mouth at bedtime.     ranitidine 300 MG tablet  Commonly known as:  ZANTAC  Take 300 mg by mouth daily.     simvastatin 40 MG tablet  Commonly known as:  ZOCOR  Take 1 tablet (40 mg total) by mouth at bedtime.     traMADol 50 MG tablet  Commonly known as:  ULTRAM  Take 50 mg by mouth every 6 (six) hours as needed.     vitamin B-12 1000 MCG tablet  Commonly known as:  CYANOCOBALAMIN  Take 1,000 mcg by mouth daily.        Diagnostic Studies: Dg Hip Operative Unilat W Or W/o Pelvis Right  08/04/2015  CLINICAL DATA:  Right hip replacement. EXAM: OPERATIVE right HIP (WITH PELVIS IF PERFORMED) 4 VIEWS as well as 2 postop views right hip. TECHNIQUE: Fluoroscopic spot image(s) were submitted for interpretation post-operatively. COMPARISON:  MRI 05/23/2015 and KUB 01/08/2009 FINDINGS: Examination demonstrates placement of a right hip arthroplasty and adequate  position and intact. Skin staples are present over the lateral soft tissues. Surgical drain is present over the surgical bed. IMPRESSION: Expected changes post right hip arthroplasty. Electronically Signed   By: Elberta Fortisaniel  Boyle M.D.   On: 08/04/2015 12:10   Dg Hip Unilat W Or W/o Pelvis 2-3 Views Right  08/04/2015  CLINICAL DATA:  Right hip replacement. EXAM: OPERATIVE right HIP (WITH PELVIS IF PERFORMED) 4 VIEWS as  well as 2 postop views right hip. TECHNIQUE: Fluoroscopic spot image(s) were submitted for interpretation post-operatively. COMPARISON:  MRI 05/23/2015 and KUB 01/08/2009 FINDINGS: Examination demonstrates placement of a right hip arthroplasty and adequate position and intact. Skin staples are present over the lateral soft tissues. Surgical drain is present over the surgical bed. IMPRESSION: Expected changes post right hip arthroplasty. Electronically Signed   By: Elberta Fortis M.D.   On: 08/04/2015 12:10    Disposition: 06-Home-Health Care Svc        Follow-up Information    Follow up with MENZ,MICHAEL, MD In 2 weeks.   Specialty:  Orthopedic Surgery   Why:  For staple removal and skin check   Contact information:   [address] [facility] [place] [address] [phone]        Signed: Amador Cunas [facility] 08/07/2015, 8:26 AM

## 2015-08-08 ENCOUNTER — Encounter
Admission: RE | Admit: 2015-08-08 | Discharge: 2015-08-08 | Disposition: A | Discharge status: Home / Self Care | Payer: Medicare Other | Source: Ambulatory Visit | Attending: Internal Medicine | Admitting: Internal Medicine

## 2015-08-08 LAB — SURGICAL PATHOLOGY

## 2015-08-08 NOTE — Progress Notes (Signed)
Called report to Merleen NicelyElizabeth Santos, Charity fundraiserN. [facility]. Called EMS for transport. Also called daughter to notify of discharge and to pick up patient belongings. Paged Dr. Rosita KeaMenz regarding prevana wound vac, no orders on discharge summary. Verbal order from MD to leave until Friday when it stops working cover with dressing.

## 2015-08-08 NOTE — Progress Notes (Signed)
Physical Therapy Treatment Patient Details Name: [name] MRN: [id number] DOB: [birth year] Today's Date: 08/08/2015    History of Present Illness Pt. is a 70 y.o. female who [facility] for a Right THR without reported post-op complications.     PT Comments    Pt initially sleeping, but easily awoken. Pt remains tired, but participates well with PT. Pt requires some assist with bed mobility and increased time. Effortful to advance Right lower extremity in/out of bed. Transfers require safety cues. Pt is unable to recite right hip anterior precaution; pt educated prior to function and with function. Pt requires cues with ambulation for improved quality regarding step lengths, Right lower extremity mobility and hip/knee/ankle and adherence to precautions with left turning. Pt to discharge to rehab today to continue progress on range, strength and all functional mobility.   Follow Up Recommendations  SNF     Equipment Recommendations  None recommended by PT    Recommendations for Other Services       Precautions / Restrictions Precautions Precautions: Anterior Hip Restrictions Weight Bearing Restrictions: Yes RLE Weight Bearing: Weight bearing as tolerated    Mobility  Bed Mobility Overal bed mobility: Needs Assistance Bed Mobility: Supine to Sit     Supine to sit: Min guard Sit to supine: Min assist   General bed mobility comments: Increased time and effort supine to sit especially advancing RLE; Min A for sit to supine for LEs.   Transfers Overall transfer level: Needs assistance Equipment used: Rolling walker (2 wheeled) Transfers: Sit to/from Stand Sit to Stand: Supervision         General transfer comment: Requires cueing for safe hand placement with both stand and sit  Ambulation/Gait Ambulation/Gait assistance: Min guard Ambulation Distance (Feet): 80 Feet Assistive device: Rolling walker (2 wheeled) Gait Pattern/deviations: Step-through  pattern;Decreased stance time - right;Decreased step length - left;Decreased dorsiflexion - right;Antalgic (Partial step through) Gait velocity: Decreased Gait velocity interpretation: Below normal speed for age/gender General Gait Details: Pt demonstrates stiff R leg ambulation with swing through phase and little to no DF on R. Pt requires cueing with turning to L for compliance with anterior hip precaution, as pt swings rw to the L in place with R leg back/ER'd despite cues initially for continuing ambulation with wider turn.    Stairs            Wheelchair Mobility    Modified Rankin (Stroke Patients Only)       Balance Overall balance assessment: Needs assistance Sitting-balance support: Bilateral upper extremity supported;Feet supported Sitting balance-Leahy Scale: Good     Standing balance support: Bilateral upper extremity supported Standing balance-Leahy Scale: Fair                      Cognition Arousal/Alertness: Lethargic Behavior During Therapy: WFL for tasks assessed/performed Overall Cognitive Status: Within Functional Limits for tasks assessed       Memory: Decreased recall of precautions (Requires education prior to out of bed and with function.)              Exercises Total Joint Exercises Ankle Circles/Pumps: AROM;Both;10 reps;Supine Quad Sets: Strengthening;Both;10 reps;Supine Gluteal Sets: Strengthening;Both;10 reps;Supine Towel Squeeze: Strengthening;Both;10 reps;Supine Short Arc Quad: AROM;Both;10 reps;Supine Heel Slides: AAROM;Right;10 reps;Supine Hip ABduction/ADduction: AAROM;Right;10 reps;Supine Straight Leg Raises: AAROM;Right;10 reps;Supine    General Comments        Pertinent Vitals/Pain Pain Assessment: No/denies pain    Home Living  Prior Function            PT Goals (current goals can now be found in the care plan section) Progress towards PT goals: Progressing toward goals     Frequency  BID    PT Plan Current plan remains appropriate    Co-evaluation             End of Session Equipment Utilized During Treatment: Gait belt Activity Tolerance: Patient tolerated treatment well;Patient limited by fatigue Patient left: in bed;with call bell/phone within reach;with bed alarm set;with SCD's reapplied     Time: 8119-1478 PT Time Calculation (min) (ACUTE ONLY): 30 min  Charges:  $Gait Training: 8-22 mins $Therapeutic Exercise: 8-22 mins                    G Codes:      Kristeen Miss, PTA 08/08/2015, 10:33 AM

## 2015-08-08 NOTE — Progress Notes (Signed)
CSW presented bed offers to patient and her daughter Jasmine DecemberSharon. [facility]. PASRR information faxed to [employer] awaiting PASRR number to be generated. CSW will continue to follow and assist.  Woodroe Modehristina Jeryn Bertoni, MSW, LCSW-A Clinical Social Work Department [phone]

## 2015-08-08 NOTE — Progress Notes (Signed)
CSW received PASRR number [id number] E. Updated FL2. Placed in [facility] basket to sign. Once signed [facility].   Woodroe Modehristina Shivaay Stormont, MSW, LCSW-A Clinical Social Work Department [phone]

## 2015-08-08 NOTE — Progress Notes (Signed)
   08/08/15 1030  Clinical Encounter Type  Visited With Patient  Visit Type Initial  Consult/Referral To Chaplain  Stress Factors  Patient Stress Factors Health changes  Visited with patient on routine rounds.  Patient was receptive and in good spirits.

## 2015-08-08 NOTE — Clinical Social Work Placement (Signed)
   CLINICAL SOCIAL WORK PLACEMENT  NOTE  Date:  08/08/2015  Patient Details  Name: [name] MRN: [id number] Date of Birth: 24-Sep-1945  Clinical Social Work is seeking post-discharge placement for this patient at the Skilled  Nursing Facility level of care (*CSW will initial, date and re-position this form in  chart as items are completed):  No   Patient/family provided with [facility] Clinical Social Work Department's list of facilities offering this level of care within the geographic area requested by the patient (or if unable, by the patient's family).  No   Patient/family informed of their freedom to choose among providers that offer the needed level of care, that participate in Medicare, Medicaid or managed care program needed by the patient, have an available bed and are willing to accept the patient.  No   Patient/family informed of [place]'s ownership interest in [facility] and [facility], as well as of the fact that they are under no obligation to receive care at these facilities.  PASRR submitted to [employer] on 08/08/15     PASRR number received on 08/08/15     Existing PASRR number confirmed on       FL2 transmitted to all facilities in geographic area requested by pt/family on 08/08/15     FL2 transmitted to all facilities within larger geographic area on       Patient informed that his/her managed care company has contracts with or will negotiate with certain facilities, including the following:        Yes   Patient/family informed of bed offers received.  Patient [facility] bed at  [facility]     Physician recommends and patient chooses bed at      Patient to be transferred to  [facility] on 08/08/15.  Patient to be transferred to facility by  (EMS)     Patient family notified on 08/08/15 of transfer.  Name of family member notified:   (Daughter)     PHYSICIAN       Additional Comment:     _______________________________________________ Idamae Lusherhristina E Rayn Enderson, LCSW 08/08/2015, 2:28 PM

## 2015-08-08 NOTE — Progress Notes (Signed)
Occupational Therapy Treatment Patient Details Name: Laurie JarvisLinda S [name] MRN: [id number] DOB: Jan 18, 1946 Today's Date: 08/08/2015    History of present illness Pt. is a 70 y.o. female who was [facility] for a Right THR without reported post-op complications.    OT comments  Pt. Is making porgress with ADL tasks, however continues to present with weakness, limited functional mobility for ADLs, and decreased activity tolerance which hinders her ability to complete ADL and IADL functioning. Pt. Continues to benefit from skilled OT intervention for ADL and A/E training, work simplification techniques, and functional mobility for ADLs in order to return to her PLOF at ALF level of care.    Follow Up Recommendations  Home health OT    Equipment Recommendations       Recommendations for Other Services      Precautions / Restrictions Precautions Precautions: None Precaution Booklet Issued: Yes (comment) Restrictions Weight Bearing Restrictions: Yes RLE Weight Bearing: Weight bearing as tolerated       Mobility Bed Mobility Overal bed mobility: Needs Assistance Bed Mobility: Supine to Sit     Supine to sit: Min guard Sit to supine: Min assist   Transfers         Balance Overall balance assessment: Needs assistance Sitting-balance support: Bilateral upper extremity supported;Feet supported Sitting balance-Leahy Scale: Good                   ADL Overall ADL's : Needs assistance/impaired                                     Functional mobility during ADLs: Minimal assistance General ADL Comments: Pt. education was provided about A/E use for LE dressing tasks. Pt. was able to demonstrate proper technique. Pt.required visual, verbal, and tactile cues to complete.      Vision                     Perception     Praxis      Cognition   Behavior During Therapy: WFL for tasks assessed/performed Overall Cognitive Status: Within Functional  Limits for tasks assessed       Memory: Decreased recall of precautions (Requires education prior to out of bed and with function.)               Extremity/Trunk Assessment               Exercises   Shoulder Instructions       General Comments      Pertinent Vitals/ Pain       Pain Assessment: 0-10 Pain Score: 0-No pain  Home Living                                          Prior Functioning/Environment              Frequency       Progress Toward Goals  OT Goals(current goals can now be found in the care plan section)        Plan Discharge plan remains appropriate    Co-evaluation                 End of Session     Activity Tolerance Patient tolerated treatment well   Patient Left in bed;with call bell/phone within  reach;with bed alarm set   Nurse Communication          Time: 1130-1145 OT Time Calculation (min): 15 min  Charges: OT General Charges $OT Visit: 1 Procedure OT Treatments $[place] Care/Home Management : 8-22 mins  Olegario Messier, MS, OTR/L  Olegario Messier 08/08/2015, 11:55 AM

## 2015-08-08 NOTE — Progress Notes (Signed)
   Subjective: 4 Days Post-Op Procedure(s) (LRB): TOTAL HIP ARTHROPLASTY ANTERIOR APPROACH (Right) Patient reports pain as mild.   Patient is well, and has had no acute complaints or problems Denies any CP, SOB, ABD pain. We will continue therapy today.  Plan is to go Rehab after hospital stay.  Objective: Vital signs in last 24 hours: Temp:  [98.1 F (36.7 C)-98.8 F (37.1 C)] 98.8 F (37.1 C) (06/12 0725) Pulse Rate:  [70-94] 83 (06/12 0725) Resp:  [16-18] 18 (06/12 0725) BP: (129-144)/(67-98) 129/67 mmHg (06/12 0725) SpO2:  [96 %-100 %] 99 % (06/12 0725)  Intake/Output from previous day: 06/11 0701 - 06/12 0700 In: 2050 [P.O.:2050] Out: 1050 [Urine:1050] Intake/Output this shift:     Recent Labs  08/06/15 0244  HGB 10.7*    Recent Labs  08/06/15 0244  WBC 9.4  RBC 3.33*  HCT 31.6*  PLT 158    Recent Labs  08/06/15 0244  NA 140  K 3.7  CL 107  CO2 28  BUN 10  CREATININE 0.63  GLUCOSE 93  CALCIUM 8.6*   No results for input(s): LABPT, INR in the last 72 hours.  EXAM General - Patient is Alert, Appropriate and Oriented Extremity - Neurovascular intact Sensation intact distally Intact pulses distally Dorsiflexion/Plantar flexion intact No cellulitis present Dressing - dressing C/D/I, no drainage in woundvac canister Motor Function - intact, moving foot and toes well on exam.   Past Medical History  Diagnosis Date  . ALCOHOL ABUSE, HX OF   . ANEMIA-NOS   . ANXIETY   . DEGENERATIVE DISC DISEASE, CERVICAL SPINE, W/RADICULOPATHY   . DEGENERATIVE DISC DISEASE, CERVICAL SPINE   . Depressive disorder, not elsewhere classified   . Depressive type psychosis (HCC)   . DIVERTICULITIS, HX OF   . DIZZINESS   . GERD   . HYPERLIPIDEMIA   . HYPERTENSION   . Insomnia, unspecified   . LOW BACK PAIN   . Memory loss   . MIGRAINE HEADACHE   . WEIGHT LOSS   . Palpitation   . CERVICAL RADICULOPATHY, LEFT     Assessment/Plan:   4 Days Post-Op  Procedure(s) (LRB): TOTAL HIP ARTHROPLASTY ANTERIOR APPROACH (Right) Active Problems:   Primary osteoarthritis of right hip  Estimated body mass index is 31.97 kg/(m^2) as calculated from the following:   Height as of this encounter: 5\' 6"  (1.676 m).   Weight as of this encounter: 89.812 kg (198 lb). Advance diet Up with therapy  Discharge to SNF today Remove wound vac once battery dies (09/10/15) and apply honeycomb dressing over incision.  Follow up with [facility] in 2 weeks for staple removal.   DVT Prophylaxis - Lovenox, Foot Pumps and TED hose Weight-Bearing as tolerated to right leg D/C O2 and Pulse OX and try on Room Air  T. Cranston Neighborhris Daylan Boggess, PA-C [facility] 08/08/2015, 7:55 AM

## 2015-08-08 NOTE — Progress Notes (Signed)
CSW checked [place] [facility] to determine if patient's PASRR number is available. It was not. CSW will continue to follow and assist.  .scun

## 2015-11-29 ENCOUNTER — Encounter: Payer: Self-pay | Admitting: Emergency Medicine

## 2015-11-29 ENCOUNTER — Observation Stay: Payer: Medicare Other

## 2015-11-29 ENCOUNTER — Emergency Department: Payer: Medicare Other

## 2015-11-29 ENCOUNTER — Observation Stay
Admission: EM | Admit: 2015-11-29 | Discharge: 2015-11-30 | Disposition: A | Discharge status: Home / Self Care | Payer: Medicare Other | Attending: Internal Medicine | Admitting: Internal Medicine

## 2015-11-29 DIAGNOSIS — M79602 Pain in left arm: Secondary | ICD-10-CM | POA: Diagnosis not present

## 2015-11-29 DIAGNOSIS — K219 Gastro-esophageal reflux disease without esophagitis: Secondary | ICD-10-CM | POA: Diagnosis not present

## 2015-11-29 DIAGNOSIS — F419 Anxiety disorder, unspecified: Secondary | ICD-10-CM | POA: Insufficient documentation

## 2015-11-29 DIAGNOSIS — F17210 Nicotine dependence, cigarettes, uncomplicated: Secondary | ICD-10-CM | POA: Diagnosis not present

## 2015-11-29 DIAGNOSIS — Z885 Allergy status to narcotic agent status: Secondary | ICD-10-CM | POA: Insufficient documentation

## 2015-11-29 DIAGNOSIS — Z96641 Presence of right artificial hip joint: Secondary | ICD-10-CM | POA: Diagnosis not present

## 2015-11-29 DIAGNOSIS — M542 Cervicalgia: Secondary | ICD-10-CM

## 2015-11-29 DIAGNOSIS — E785 Hyperlipidemia, unspecified: Secondary | ICD-10-CM | POA: Diagnosis not present

## 2015-11-29 DIAGNOSIS — I10 Essential (primary) hypertension: Secondary | ICD-10-CM | POA: Insufficient documentation

## 2015-11-29 DIAGNOSIS — R0789 Other chest pain: Principal | ICD-10-CM | POA: Insufficient documentation

## 2015-11-29 DIAGNOSIS — R079 Chest pain, unspecified: Secondary | ICD-10-CM

## 2015-11-29 DIAGNOSIS — G4700 Insomnia, unspecified: Secondary | ICD-10-CM | POA: Insufficient documentation

## 2015-11-29 DIAGNOSIS — F329 Major depressive disorder, single episode, unspecified: Secondary | ICD-10-CM | POA: Diagnosis not present

## 2015-11-29 DIAGNOSIS — R200 Anesthesia of skin: Secondary | ICD-10-CM | POA: Insufficient documentation

## 2015-11-29 LAB — BASIC METABOLIC PANEL
Anion gap: 6 (ref 5–15)
BUN: 14 mg/dL (ref 6–20)
CALCIUM: 9.7 mg/dL (ref 8.9–10.3)
CHLORIDE: 109 mmol/L (ref 101–111)
CO2: 24 mmol/L (ref 22–32)
CREATININE: 0.87 mg/dL (ref 0.44–1.00)
GFR calc non Af Amer: >60 mL/min (ref >60)
Glucose, Bld: 91 mg/dL (ref 65–99)
Potassium: 4.1 mmol/L (ref 3.5–5.1)
SODIUM: 139 mmol/L (ref 135–145)

## 2015-11-29 LAB — CBC
HCT: 40.5 % (ref 35.0–47.0)
HEMOGLOBIN: 14 g/dL (ref 12.0–16.0)
MCH: 31.3 pg (ref 26.0–34.0)
MCHC: 34.5 g/dL (ref 32.0–36.0)
MCV: 90.6 fL (ref 80.0–100.0)
Platelets: 231 10*3/uL (ref 150–440)
RBC: 4.47 MIL/uL (ref 3.80–5.20)
RDW: 13.6 % (ref 11.5–14.5)
WBC: 8.6 10*3/uL (ref 3.6–11.0)

## 2015-11-29 LAB — TROPONIN I: Troponin I: <0.03 ng/mL (ref <0.03)

## 2015-11-29 MED ORDER — SIMVASTATIN 40 MG PO TABS
40.0000 mg | ORAL_TABLET | Freq: Every day | ORAL | Status: DC
  Administered 2015-11-29: 40 mg via ORAL
  Filled 2015-11-29: qty 1

## 2015-11-29 MED ORDER — ACETAMINOPHEN 325 MG PO TABS
650.0000 mg | ORAL_TABLET | Freq: Four times a day (QID) | ORAL | Status: DC | PRN
  Administered 2015-11-30 (×2): 650 mg via ORAL
  Filled 2015-11-29 (×2): qty 2

## 2015-11-29 MED ORDER — ALUM & MAG HYDROXIDE-SIMETH 200-200-20 MG/5ML PO SUSP: 30.0000 mL | Freq: Every day | ORAL | Status: DC | PRN

## 2015-11-29 MED ORDER — ZOLPIDEM TARTRATE 5 MG PO TABS: 5.0000 mg | ORAL_TABLET | Freq: Every evening | ORAL | Status: DC | PRN

## 2015-11-29 MED ORDER — MELATONIN 5 MG PO TABS
1.0000 | ORAL_TABLET | Freq: Every evening | ORAL | Status: DC | PRN
  Filled 2015-11-29: qty 1

## 2015-11-29 MED ORDER — SENNA 8.6 MG PO TABS: 1.0000 | ORAL_TABLET | Freq: Every day | ORAL | Status: DC

## 2015-11-29 MED ORDER — TRAMADOL HCL 50 MG PO TABS
50.0000 mg | ORAL_TABLET | Freq: Two times a day (BID) | ORAL | Status: DC
  Administered 2015-11-29 – 2015-11-30 (×2): 50 mg via ORAL
  Filled 2015-11-29 (×2): qty 1

## 2015-11-29 MED ORDER — BUSPIRONE HCL 5 MG PO TABS
5.0000 mg | ORAL_TABLET | Freq: Two times a day (BID) | ORAL | Status: DC
  Administered 2015-11-29 – 2015-11-30 (×2): 5 mg via ORAL
  Filled 2015-11-29 (×2): qty 1

## 2015-11-29 MED ORDER — GUAIFENESIN 100 MG/5ML PO SYRP
10.0000 mL | ORAL_SOLUTION | Freq: Four times a day (QID) | ORAL | Status: DC | PRN
  Filled 2015-11-29: qty 10

## 2015-11-29 MED ORDER — ASPIRIN EC 81 MG PO TBEC
81.0000 mg | DELAYED_RELEASE_TABLET | Freq: Every day | ORAL | Status: DC
  Administered 2015-11-30: 81 mg via ORAL
  Filled 2015-11-29: qty 1

## 2015-11-29 MED ORDER — SERTRALINE HCL 50 MG PO TABS
50.0000 mg | ORAL_TABLET | Freq: Every day | ORAL | Status: DC
  Administered 2015-11-30: 50 mg via ORAL
  Filled 2015-11-29: qty 1

## 2015-11-29 MED ORDER — QUETIAPINE FUMARATE 25 MG PO TABS
25.0000 mg | ORAL_TABLET | Freq: Every day | ORAL | Status: DC
  Administered 2015-11-29: 25 mg via ORAL
  Filled 2015-11-29: qty 1

## 2015-11-29 MED ORDER — BUPROPION HCL ER (SR) 150 MG PO TB12
150.0000 mg | ORAL_TABLET | Freq: Every day | ORAL | Status: DC
  Filled 2015-11-29 (×2): qty 1

## 2015-11-29 MED ORDER — ENOXAPARIN SODIUM 40 MG/0.4ML ~~LOC~~ SOLN
40.0000 mg | SUBCUTANEOUS | Status: DC
  Administered 2015-11-29: 40 mg via SUBCUTANEOUS
  Filled 2015-11-29: qty 0.4

## 2015-11-29 MED ORDER — ACETAMINOPHEN 650 MG RE SUPP: 650.0000 mg | Freq: Four times a day (QID) | RECTAL | Status: DC | PRN

## 2015-11-29 MED ORDER — LOPERAMIDE HCL 2 MG PO CAPS: 2.0000 mg | ORAL_CAPSULE | ORAL | Status: DC | PRN

## 2015-11-29 MED ORDER — FAMOTIDINE 20 MG PO TABS
20.0000 mg | ORAL_TABLET | Freq: Two times a day (BID) | ORAL | Status: DC
  Administered 2015-11-29: 20 mg via ORAL
  Filled 2015-11-29: qty 1

## 2015-11-29 MED ORDER — ASPIRIN 81 MG PO CHEW
324.0000 mg | CHEWABLE_TABLET | Freq: Once | ORAL | Status: AC
  Administered 2015-11-29: 324 mg via ORAL
  Filled 2015-11-29: qty 4

## 2015-11-29 MED ORDER — MAGNESIUM HYDROXIDE 400 MG/5ML PO SUSP: 30.0000 mL | Freq: Every evening | ORAL | Status: DC | PRN

## 2015-11-29 MED ORDER — DONEPEZIL HCL 5 MG PO TABS
10.0000 mg | ORAL_TABLET | Freq: Every day | ORAL | Status: DC
  Administered 2015-11-29: 10 mg via ORAL
  Filled 2015-11-29: qty 2

## 2015-11-29 MED ORDER — DICYCLOMINE HCL 10 MG PO CAPS
10.0000 mg | ORAL_CAPSULE | Freq: Four times a day (QID) | ORAL | Status: DC
  Administered 2015-11-29 – 2015-11-30 (×3): 10 mg via ORAL
  Filled 2015-11-29 (×5): qty 1

## 2015-11-29 MED ORDER — PANTOPRAZOLE SODIUM 40 MG PO TBEC: 40.0000 mg | DELAYED_RELEASE_TABLET | Freq: Every day | ORAL | Status: DC

## 2015-11-29 MED ORDER — FLUOXETINE HCL 20 MG PO TABS: 20.0000 mg | ORAL_TABLET | Freq: Every day | ORAL | Status: DC

## 2015-11-29 MED ORDER — VITAMIN B-12 1000 MCG PO TABS: 1000.0000 ug | ORAL_TABLET | Freq: Every day | ORAL | Status: DC

## 2015-11-29 NOTE — ED Notes (Signed)
MD at bedside. Dr. Fonnie BirkenheadWhiting in room to assess patient.

## 2015-11-29 NOTE — ED Provider Notes (Signed)
[facility] Emergency Department Provider Note  ____________________________________________  Time seen: Approximately 5:38 PM  I have reviewed the triage vital signs and the nursing notes.   HISTORY  Chief Complaint Chest Pain    HPI [name] is a 70 y.o. female with a history of HTN, HL presenting with several days of chest pain. Per report, the patient has had intermittent chest pain that radiates down the left arm.The patient reports that 2 nights ago she woke up with numbness in the left arm "like I had fallen asleep on it." It did not improve when she moved her arm around or shook it out. Yesterday, she had a consistent left-sided "dull ache" that was 6 out of 10 and radiated into the left neck, left shoulder, down into the left arm. It was associated with numbness of the left hand. It would improve if she laid down but would come back if she moved around. Today she had similar symptoms that came in for further evaluation. The patient has not had any risk certification studies for several years.   SH:  + tobacco, no cocaine  The patient reports that she has not ever had a stress test or other cardiac risk stratification study.   Past Medical History:  Diagnosis Date  . ALCOHOL ABUSE, HX OF   . ANEMIA-NOS   . ANXIETY   . CERVICAL RADICULOPATHY, LEFT   . DEGENERATIVE DISC DISEASE, CERVICAL SPINE   . DEGENERATIVE DISC DISEASE, CERVICAL SPINE, W/RADICULOPATHY   . Depressive disorder, not elsewhere classified   . Depressive type psychosis (HCC)   . DIVERTICULITIS, HX OF   . DIZZINESS   . GERD   . HYPERLIPIDEMIA   . HYPERTENSION   . Insomnia, unspecified   . LOW BACK PAIN   . Memory loss   . MIGRAINE HEADACHE   . Palpitation   . WEIGHT LOSS     Patient Active Problem List   Diagnosis Date Noted  . Primary osteoarthritis of right hip 08/04/2015  . Preventative health care 01/15/2011  . WEIGHT LOSS 06/09/2009  . Depressive type psychosis  (HCC) 10/12/2008  . CERVICAL RADICULOPATHY, LEFT 10/12/2008  . DIZZINESS 09/29/2008  . DEGENERATIVE DISC DISEASE, CERVICAL SPINE, W/RADICULOPATHY 07/21/2008  . Memory loss 01/09/2008  . Depressive disorder, not elsewhere classified 07/16/2007  . ANEMIA-NOS 01/14/2007  . HYPERTENSION 01/14/2007  . DEGENERATIVE DISC DISEASE, CERVICAL SPINE 01/14/2007  . LOW BACK PAIN 01/14/2007  . HYPERLIPIDEMIA 10/25/2006  . MIGRAINE HEADACHE 10/25/2006  . Insomnia, unspecified 10/25/2006  . ALCOHOL ABUSE, HX OF 10/25/2006  . DIVERTICULITIS, HX OF 10/25/2006  . ANXIETY 09/19/2006  . GERD 09/19/2006    Past Surgical History:  Procedure Laterality Date  . BACK SURGERY    . CERVICAL FUSION     X 2, [place], [place], [place]  . DILATION AND CURETTAGE OF UTERUS    . ESOPHAGOGASTRODUODENOSCOPY N/A 01/03/2015   Procedure: ESOPHAGOGASTRODUODENOSCOPY (EGD);  Surgeon: Elnita Maxwell, MD;  Location: [facility] ENDOSCOPY;  Service: Endoscopy;  Laterality: N/A;  . EYE SURGERY Bilateral    Cataract Extraction with IOL  . LUMBAR FUSION    . TONSILLECTOMY    . TOTAL HIP ARTHROPLASTY Right 08/04/2015   Procedure: TOTAL HIP ARTHROPLASTY ANTERIOR APPROACH;  Surgeon: Kennedy Bucker, MD;  Location: [facility] ORS;  Service: Orthopedics;  Laterality: Right;    Current Outpatient Rx  . Order #: [id number] Class: Historical Med  . Order #: [id number] Class: Historical Med  . Order #: [id number] Class: Historical Med  .  Order #: [id number] Class: Historical Med  . Order #: [id number] Class: Print  . Order #: [id number] Class: Historical Med  . Order #: [id number] Class: Historical Med  . Order #: [id number] Class: Historical Med  . Order #: [id number] Class: Print  . Order #: [id number] Class: Historical Med  . Order #: [id number] Class: Historical Med  . Order #: [id number] Class: Normal  . Order #: [id number] Class: Historical Med  . Order #: [id number] Class: Historical Med    Allergies Atorvastatin; Codeine; and Morphine  Family History   Problem Relation Age of Onset  . Depression Other   . Alcohol abuse Other     Social History Social History  Substance Use Topics  . Smoking status: Current Every Day Smoker    Packs/day: 0.50    Types: Cigarettes  . Smokeless tobacco: Never Used  . Alcohol use No    Review of Systems Constitutional: No fever/chills. Eyes: No visual changes. ENT: No sore throat. No congestion or rhinorrhea. Cardiovascular: Positive chest pain. Denies palpitations. Respiratory: Denies shortness of breath.  No cough. Gastrointestinal: No abdominal pain.  No nausea, no vomiting.  No diarrhea.  No constipation. Genitourinary: Negative for dysuria. Musculoskeletal: Negative for back pain. Skin: Negative for rash. Neurological: Negative for headaches. No focal numbness, tingling or weakness.   10-point ROS otherwise negative.  ____________________________________________   PHYSICAL EXAM:  VITAL SIGNS: ED Triage Vitals  Enc Vitals Group     BP 11/29/15 1659 (!) 153/95     Pulse Rate 11/29/15 1659 84     Resp 11/29/15 1659 14     Temp 11/29/15 1659 98.1 F (36.7 C)     Temp Source 11/29/15 1659 Oral     SpO2 11/29/15 1659 99 %     Weight 11/29/15 1701 200 lb (90.7 kg)     Height 11/29/15 1701 5\' 5"  (1.651 m)     Head Circumference --      Peak Flow --      Pain Score 11/29/15 1702 5     Pain Loc --      Pain Edu? --      Excl. in GC? --     Constitutional: Alert and oriented. Well appearing and in no acute distress. Answers questions appropriately. Eyes: Conjunctivae are normal.  EOMI. No scleral icterus. Head: Atraumatic. Nose: No congestion/rhinnorhea. Mouth/Throat: Mucous membranes are moist.  Neck: No stridor.  Supple.  No JVD.  No meningismus. Cardiovascular: Normal rate, regular rhythm. No murmurs, rubs or gallops.  Respiratory: Normal respiratory effort.  No accessory muscle use or retractions. Lungs CTAB.  No wheezes, rales or ronchi. Gastrointestinal: Soft, nontender  and nondistended.  No guarding or rebound.  No peritoneal signs. Musculoskeletal: No LE edema. No ttp in the calves or palpable cords.  Negative Homan's sign. Neurologic:  A&Ox3.  Speech is clear.  Face and smile are symmetric.  EOMI.  Moves all extremities well. Skin:  Skin is warm, dry and intact. No rash noted. Psychiatric: Mood and affect are normal. Speech and behavior are normal.  Normal judgement.  ____________________________________________   LABS (all labs ordered are listed, but only abnormal results are displayed)  Labs Reviewed  CBC  BASIC METABOLIC PANEL  TROPONIN I   ____________________________________________  EKG  ED ECG REPORT I, Rockne Menghini, the attending physician, personally viewed and interpreted this ECG.   Date: 11/29/2015  EKG Time: 1658  Rate: 80  Rhythm: normal sinus rhythm  Axis: normal  Intervals:none  ST&T Change: Nonspecific T-wave inversion in V1. No ST elevation.  ____________________________________________  RADIOLOGY  Dg Chest 2 View  Result Date: 11/29/2015 CLINICAL DATA:  Chest pain and left arm tingling EXAM: CHEST  2 VIEW COMPARISON:  Chest radiograph 04/08/2011 FINDINGS: The lungs are well inflated without focal consolidation or pulmonary edema. No pleural effusion or pneumothorax. Cardiomediastinal contours are normal. IMPRESSION: Clear lungs. Electronically Signed   By: Deatra RobinsonKevin  Herman M.D.   On: 11/29/2015 17:53    ____________________________________________   PROCEDURES  Procedure(s) performed: None  Procedures  Critical Care performed: No ____________________________________________   INITIAL IMPRESSION / ASSESSMENT AND PLAN / ED COURSE  Pertinent labs & imaging results that were available during my care of the patient were reviewed by me and considered in my medical decision making (see chart for details).  70 y.o. female with a history of HTN, HL, and ongoing tobacco abuse presenting with 2 days of  left-sided chest pain that radiates into the neck and arm associated with numbness of the left hand. Overall, the patient has mild hypertension but otherwise stable vital signs, has an EKG without ischemic changes, no troponin that is negative. However, I am concerned about ACS or MI in this patient given her multiple cardiac risk factors. Other etiologies including musculoskeletal pain, gastrointestinal related pain including GERD or esophageal spasm, or also possible. I do not see any evidence of DVT, nor she hypoxic or tachycardic so PE is much less likely. Her signs and symptoms are much less consistent with aortic pathology. At this time, I'll plan to give the patient aspirin but she does not deny to glycerin because her pain has completely resolved. She has been admitted to the hospitalist for further evaluation and treatment.  ____________________________________________  FINAL CLINICAL IMPRESSION(S) / ED DIAGNOSES  Final diagnoses:  Chest pain, unspecified type  Neck pain on left side  Left arm numbness  Left arm pain    Clinical Course      NEW MEDICATIONS STARTED DURING THIS VISIT:  New Prescriptions   No medications on file      Rockne MenghiniAnne-Caroline Hyun Reali, MD 11/29/15 1849

## 2015-11-29 NOTE — H&P (Signed)
[facility] [place] [facility]   PATIENT NAME: [name]    MR#:  [id number]  DATE OF BIRTH:  January 27, 1946  DATE OF ADMISSION:  11/29/2015  PRIMARY CARE PHYSICIAN: Oliver Barre, MD   REQUESTING/REFERRING PHYSICIAN: Dr Virgilio Frees  CHIEF COMPLAINT:   Chief Complaint  Patient presents with  . Chest Pain    HISTORY OF PRESENT ILLNESS:  [name]  is a 70 y.o. female presents with chest pain. This chest pain was all day yesterday and part of the day today. Left side of the chest described as a constant ache. Not that severe. If she sleeping she doesn't feel it. A few nights ago she had left arm numbness which woke her up from sleep. Today he also had left arm numbness and tingling. No shortness of breath. Some sweating. No nausea. In the ER first cardiac enzyme was negative and hospitalist services were contacted for further evaluation.  PAST MEDICAL HISTORY:   Past Medical History:  Diagnosis Date  . ALCOHOL ABUSE, HX OF   . ANEMIA-NOS   . ANXIETY   . CERVICAL RADICULOPATHY, LEFT   . DEGENERATIVE DISC DISEASE, CERVICAL SPINE   . DEGENERATIVE DISC DISEASE, CERVICAL SPINE, W/RADICULOPATHY   . Depressive disorder, not elsewhere classified   . Depressive type psychosis (HCC)   . DIVERTICULITIS, HX OF   . DIZZINESS   . GERD   . HYPERLIPIDEMIA   . HYPERTENSION   . Insomnia, unspecified   . LOW BACK PAIN   . Memory loss   . MIGRAINE HEADACHE   . Palpitation   . WEIGHT LOSS     PAST SURGICAL HISTORY:   Past Surgical History:  Procedure Laterality Date  . BACK SURGERY    . CERVICAL FUSION     X 2, [place], [place], [place]  . DILATION AND CURETTAGE OF UTERUS    . ESOPHAGOGASTRODUODENOSCOPY N/A 01/03/2015   Procedure: ESOPHAGOGASTRODUODENOSCOPY (EGD);  Surgeon: Elnita Maxwell, MD;  Location: [facility] ENDOSCOPY;  Service: Endoscopy;  Laterality: N/A;  . EYE SURGERY Bilateral    Cataract Extraction with IOL  . LUMBAR FUSION    .  TONSILLECTOMY    . TOTAL HIP ARTHROPLASTY Right 08/04/2015   Procedure: TOTAL HIP ARTHROPLASTY ANTERIOR APPROACH;  Surgeon: Kennedy Bucker, MD;  Location: [facility] ORS;  Service: Orthopedics;  Laterality: Right;    SOCIAL HISTORY:   Social History  Substance Use Topics  . Smoking status: Current Every Day Smoker    Packs/day: 0.50    Types: Cigarettes  . Smokeless tobacco: Never Used  . Alcohol use No    FAMILY HISTORY:   Family History  Problem Relation Age of Onset  . Alcohol abuse Father   . Depression Other   . Alcohol abuse Other     DRUG ALLERGIES:   Allergies  Allergen Reactions  . Atorvastatin Other (See Comments)    "unknown"  . Codeine Itching  . Morphine Other (See Comments)    "severe headache"    REVIEW OF SYSTEMS:  CONSTITUTIONAL: No fever, fatigue or weakness.  EYES: No blurred or double vision.  EARS, NOSE, AND THROAT: No tinnitus or ear pain. No sore throat. Positive for postnasal drip RESPIRATORY: No cough, shortness of breath, wheezing or hemoptysis.  CARDIOVASCULAR: Positive for chest pain. No orthopnea, edema.  GASTROINTESTINAL: No nausea, vomiting, diarrhea. Occasional abdominal pain. No blood in bowel movements GENITOURINARY: No dysuria, hematuria.  ENDOCRINE: No polyuria, nocturia,  HEMATOLOGY: No anemia, easy bruising or bleeding SKIN:  No rash or lesion. MUSCULOSKELETAL: No joint pain or arthritis.   NEUROLOGIC: Numbness and tingling left arm PSYCHIATRY: History of anxiety and depression.   MEDICATIONS AT HOME:   Prior to Admission medications   Medication Sig Start Date End Date Taking? Authorizing Provider  acetaminophen (TYLENOL) 325 MG tablet Take 650 mg by mouth every 6 (six) hours as needed.    Historical Provider, MD  buPROPion (WELLBUTRIN SR) 150 MG 12 hr tablet Take 150 mg by mouth daily.      Historical Provider, MD  dicyclomine (BENTYL) 10 MG capsule Take 10 mg by mouth every 6 (six) hours.    Historical Provider, MD  donepezil  (ARICEPT) 5 MG tablet Take 10 mg by mouth at bedtime.     Historical Provider, MD  enoxaparin (LOVENOX) 40 MG/0.4ML injection Inject 0.4 mLs (40 mg total) into the skin daily. 08/07/15   Evon Slack, PA-C  FLUoxetine (PROZAC) 20 MG tablet Take 20 mg by mouth daily.      Historical Provider, MD  meloxicam (MOBIC) 7.5 MG tablet Take 7.5 mg by mouth daily.    Historical Provider, MD  omeprazole (PRILOSEC) 40 MG capsule Take 40 mg by mouth daily.    Historical Provider, MD  oxyCODONE (OXY IR/ROXICODONE) 5 MG immediate release tablet Take 1-2 tablets (5-10 mg total) by mouth every 3 (three) hours as needed for breakthrough pain. 08/07/15   Evon Slack, PA-C  QUEtiapine (SEROQUEL) 25 MG tablet Take 25 mg by mouth at bedtime.     Historical Provider, MD  ranitidine (ZANTAC) 300 MG tablet Take 300 mg by mouth daily.    Historical Provider, MD  simvastatin (ZOCOR) 40 MG tablet Take 1 tablet (40 mg total) by mouth at bedtime. 01/15/11 07/18/15  Corwin Levins, MD  traMADol (ULTRAM) 50 MG tablet Take 50 mg by mouth every 6 (six) hours as needed.     Historical Provider, MD  vitamin B-12 (CYANOCOBALAMIN) 1000 MCG tablet Take 1,000 mcg by mouth daily.    Historical Provider, MD      VITAL SIGNS:  Blood pressure (!) 155/79, pulse 78, temperature 98.1 F (36.7 C), temperature source Oral, resp. rate 17, height 5\' 5"  (1.651 m), weight 90.7 kg (200 lb), SpO2 98 %.  PHYSICAL EXAMINATION:  GENERAL:  70 y.o.-year-old patient lying in the bed with no acute distress.  EYES: Pupils equal, round, reactive to light and accommodation. No scleral icterus. Extraocular muscles intact.  HEENT: Head atraumatic, normocephalic. Oropharynx and nasopharynx clear.  NECK:  Supple, no jugular venous distention. No thyroid enlargement, no tenderness.  LUNGS: Normal breath sounds bilaterally, no wheezing, rales,rhonchi or crepitation. No use of accessory muscles of respiration.  CARDIOVASCULAR: S1, S2 normal. No murmurs, rubs,  or gallops.Slight pain to palpation over left chest wall  ABDOMEN: Soft, nontender, nondistended. Bowel sounds present. No organomegaly or mass.  EXTREMITIES: No pedal edema, cyanosis, or clubbing.  NEUROLOGIC: Cranial nerves II through XII are intact. Muscle strength 5/5 in all extremities. Sensation intact. Gait not checked.  PSYCHIATRIC: The patient is alert and oriented x 3.  SKIN: No rash seen left side of the chest and back.  LABORATORY PANEL:   CBC  Recent Labs Lab 11/29/15 1715  WBC 8.6  HGB 14.0  HCT 40.5  PLT 231   ------------------------------------------------------------------------------------------------------------------  Chemistries   Recent Labs Lab 11/29/15 1715  NA 139  K 4.1  CL 109  CO2 24  GLUCOSE 91  BUN 14  CREATININE 0.87  CALCIUM  9.7   ------------------------------------------------------------------------------------------------------------------  Cardiac Enzymes  Recent Labs Lab 11/29/15 1715  TROPONINI <0.03   ------------------------------------------------------------------------------------------------------------------  RADIOLOGY:  Dg Chest 2 View  Result Date: 11/29/2015 CLINICAL DATA:  Chest pain and left arm tingling EXAM: CHEST  2 VIEW COMPARISON:  Chest radiograph 04/08/2011 FINDINGS: The lungs are well inflated without focal consolidation or pulmonary edema. No pleural effusion or pneumothorax. Cardiomediastinal contours are normal. IMPRESSION: Clear lungs. Electronically Signed   By: Deatra RobinsonKevin  Herman M.D.   On: 11/29/2015 17:53    EKG:   Sinus rhythm 80 bpm no acute ST-T wave changes  IMPRESSION AND PLAN:   1. Chest pain. Admit as observation to telemetry get stress test in the morning if cardiac enzymes remain negative. Patient received aspirin in the ER. 2. Left arm numbness. Patient moving her neck around to good at this point. I will get a CT scan of the head. Left arm numbness could be related to the chest pain.  Patient does have a history of cervical fusions in the past. 3. Essential hypertension continue usual medications 4. Hyperlipidemia unspecified on Zocor 5. Anxiety depression continue usual medications    All the records are reviewed and case discussed with ED provider. Management plans discussed with the patient, family and they are in agreement.  CODE STATUS: Full code  TOTAL TIME TAKING CARE OF THIS PATIENT: 50 minutes.    Alford HighlandWIETING, Swanson Farnell M.D on 11/29/2015 at 7:16 PM  Between 7am to 6pm - Pager - [phone]  After 6pm call admission pager [phone]  [facility]  [phone]  CC: Primary care physician; Oliver BarreJames John, MD

## 2015-11-29 NOTE — ED Triage Notes (Signed)
Patient presents to ED via EMS from [facility] with 2 days of chest pain and left arm tingling and numbness. Patient reports that her blood pressure has been elevated for the last 2 days. Patient reports that the pain is in the left center of her chest and radiates into her left shoulder. This pain is constant and is not worsened by anything and relieved by applying pressure to her chest. Patient denies any previous cardiac issues. Patient does not appear to be in any distress at this time.

## 2015-11-30 ENCOUNTER — Encounter: Payer: Self-pay | Admitting: Radiology

## 2015-11-30 ENCOUNTER — Observation Stay: Payer: Medicare Other

## 2015-11-30 DIAGNOSIS — R0789 Other chest pain: Secondary | ICD-10-CM | POA: Diagnosis not present

## 2015-11-30 LAB — NM MYOCAR MULTI W/SPECT W/WALL MOTION / EF
CHL CUP NUCLEAR SDS: 0
CHL CUP NUCLEAR SRS: 0
CSEPHR: 72 %
CSEPPHR: 107 {beats}/min
LV sys vol: 8 mL
LVDIAVOL: 32 mL (ref 46–106)
Rest HR: 69 {beats}/min
SSS: 0
TID: 1.33

## 2015-11-30 LAB — BASIC METABOLIC PANEL
ANION GAP: 6 (ref 5–15)
BUN: 14 mg/dL (ref 6–20)
CO2: 23 mmol/L (ref 22–32)
Calcium: 8.9 mg/dL (ref 8.9–10.3)
Chloride: 111 mmol/L (ref 101–111)
Creatinine, Ser: 0.49 mg/dL (ref 0.44–1.00)
GFR calc Af Amer: >60 mL/min (ref >60)
GLUCOSE: 91 mg/dL (ref 65–99)
POTASSIUM: 3.6 mmol/L (ref 3.5–5.1)
Sodium: 140 mmol/L (ref 135–145)

## 2015-11-30 LAB — CBC
HEMATOCRIT: 37.5 % (ref 35.0–47.0)
HEMOGLOBIN: 13.1 g/dL (ref 12.0–16.0)
MCH: 31.5 pg (ref 26.0–34.0)
MCHC: 34.8 g/dL (ref 32.0–36.0)
MCV: 90.4 fL (ref 80.0–100.0)
Platelets: 192 10*3/uL (ref 150–440)
RBC: 4.15 MIL/uL (ref 3.80–5.20)
RDW: 13.7 % (ref 11.5–14.5)
WBC: 7.5 10*3/uL (ref 3.6–11.0)

## 2015-11-30 LAB — LIPID PANEL
CHOL/HDL RATIO: 4.4 ratio
Cholesterol: 176 mg/dL (ref 0–200)
HDL: 40 mg/dL — ABNORMAL LOW (ref >40)
LDL CALC: 71 mg/dL (ref 0–99)
TRIGLYCERIDES: 324 mg/dL — AB (ref <150)
VLDL: 65 mg/dL — AB (ref 0–40)

## 2015-11-30 LAB — TROPONIN I: Troponin I: <0.03 ng/mL (ref <0.03)

## 2015-11-30 MED ORDER — REGADENOSON 0.4 MG/5ML IV SOLN
0.4000 mg | Freq: Once | INTRAVENOUS | Status: AC
  Administered 2015-11-30: 0.4 mg via INTRAVENOUS

## 2015-11-30 MED ORDER — ASPIRIN 81 MG PO TBEC: 81.0000 mg | DELAYED_RELEASE_TABLET | Freq: Every day | ORAL | 1 refills | Status: DC

## 2015-11-30 MED ORDER — TECHNETIUM TC 99M TETROFOSMIN IV KIT
13.2500 | PACK | Freq: Once | INTRAVENOUS | Status: AC | PRN
  Administered 2015-11-30: 13.25 via INTRAVENOUS

## 2015-11-30 MED ORDER — TECHNETIUM TC 99M TETROFOSMIN IV KIT
29.5300 | PACK | Freq: Once | INTRAVENOUS | Status: AC | PRN
  Administered 2015-11-30: 29.53 via INTRAVENOUS

## 2015-11-30 NOTE — Discharge Instructions (Signed)
Follow with PMD in 2 weeks. °

## 2015-11-30 NOTE — Care Management (Signed)
Patient is fro [facility].  CSW aware.  Place di observation for chest pain.  Troponins negative.  Should discharge if stress test is negative

## 2015-11-30 NOTE — Progress Notes (Signed)
Patient discharged via wheelchair and private vehicle. IV removed and catheter intact. All discharge instructions given and patient verbalizes understanding. Tele removed and returned. No prescriptions given to patient No distress noted.   

## 2015-11-30 NOTE — Care Management Obs Status (Signed)
MEDICARE OBSERVATION STATUS NOTIFICATION   Patient Details  Name: Laurie LollLinda S Hasan MRN: [id number] Date of Birth: 15-Apr-1945   Medicare Observation Status Notification Given:  Yes  Patient did not want to sign because did not understand the explanation. Spoke with patient's daughter Jasmine DecemberSharon and explained notice over the phone    Eber HongGreene, Diera Wirkkala R, RN 11/30/2015, 3:12 PM

## 2015-11-30 NOTE — Progress Notes (Signed)
Notified dr. Elisabeth Pigeonvachhani that stress test results are back and he reviewed and stated to put in a discharge order for the patient. Will proceed.

## 2015-12-02 ENCOUNTER — Telehealth: Payer: Self-pay

## 2015-12-02 NOTE — Telephone Encounter (Signed)
Pt is on TCM list for left arm numbness, left arm pain, neck pain on left side.  Pt dc'ed on 11/30/2015 but no instructions to view.

## 2015-12-04 NOTE — Discharge Summary (Signed)
[facility] [place] [facility]   PATIENT NAME: Migdalia Olejniczak    MR#:  [id number]  DATE OF BIRTH:  April 10, 1945  DATE OF ADMISSION:  11/29/2015 ADMITTING PHYSICIAN: Alford Highland, MD  DATE OF DISCHARGE: 11/30/2015  7:07 PM  PRIMARY CARE PHYSICIAN: Oliver Barre, MD    ADMISSION DIAGNOSIS:  Left arm numbness [R20.0] Left arm pain [M79.602] Neck pain on left side [M54.2] Chest pain [R07.9] Chest pain, unspecified type [R07.9]  DISCHARGE DIAGNOSIS:  Active Problems:   Chest pain   NM stress test negative.  SECONDARY DIAGNOSIS:   Past Medical History:  Diagnosis Date  . ALCOHOL ABUSE, HX OF   . ANEMIA-NOS   . ANXIETY   . CERVICAL RADICULOPATHY, LEFT   . DEGENERATIVE DISC DISEASE, CERVICAL SPINE   . DEGENERATIVE DISC DISEASE, CERVICAL SPINE, W/RADICULOPATHY   . Depressive disorder, not elsewhere classified   . Depressive type psychosis (HCC)   . DIVERTICULITIS, HX OF   . DIZZINESS   . GERD   . HYPERLIPIDEMIA   . HYPERTENSION   . Insomnia, unspecified   . LOW BACK PAIN   . Memory loss   . MIGRAINE HEADACHE   . Palpitation   . WEIGHT LOSS     HOSPITAL COURSE:   1. Chest pain. Admitted as observation to telemetry get stress test in the morning if cardiac enzymes remain negative. Patient received aspirin in the ER.   Negatvie NM stress test next day- d/c. 2. Left arm numbness. Resolved. 3. Essential hypertension continue usual medications 4. Hyperlipidemia unspecified on Zocor 5. Anxiety depression continue usual medications   DISCHARGE CONDITIONS:   Stable.  CONSULTS OBTAINED:    DRUG ALLERGIES:   Allergies  Allergen Reactions  . Aspirin Other (See Comments)    Reaction: stomach ache  . Atorvastatin Other (See Comments)    "unknown"  . Codeine Itching  . Morphine Other (See Comments)    "severe headache"    DISCHARGE MEDICATIONS:   Discharge Medication List as of 11/30/2015  6:18 PM    START taking these medications   Details  aspirin EC 81 MG EC tablet Take 1 tablet (81 mg total) by mouth daily., Starting Thu 12/01/2015, Normal      CONTINUE these medications which have NOT CHANGED   Details  Acetaminophen 500 MG coapsule Take 500 mg by mouth every 4 (four) hours as needed. Not to exceed 2000mg . A) for fever 99.5-101F. Monitor temp three times daily until normal, above 164f Call Md. B) Headache C) Minor discomfort, Historical Med    alum & mag hydroxide-simeth (MINTOX) 200-200-20 MG/5ML suspension Take 30 mLs by mouth daily as needed for indigestion or heartburn., Historical Med    buPROPion (WELLBUTRIN SR) 150 MG 12 hr tablet Take 150 mg by mouth daily.  , Historical Med    busPIRone (BUSPAR) 5 MG tablet Take 5 mg by mouth 2 (two) times daily., Historical Med    dicyclomine (BENTYL) 10 MG capsule Take 10 mg by mouth every 6 (six) hours., Historical Med    donepezil (ARICEPT) 10 MG tablet Take 10 mg by mouth at bedtime. , Historical Med    guaifenesin (ROBITUSSIN) 100 MG/5ML syrup Take 10 mLs by mouth every 6 (six) hours as needed for cough. Not to exceed 4 doses in 24 hours, Historical Med    loperamide (IMODIUM) 2 MG capsule Take 2 mg by mouth as needed for diarrhea or loose stools., Historical Med    magnesium hydroxide (MILK OF MAGNESIA)  400 MG/5ML suspension Take 30 mLs by mouth at bedtime as needed for mild constipation., Historical Med    Melatonin 5 MG TABS Take 1 tablet by mouth at bedtime as needed., Historical Med    Neomycin-Bacitracin-Polymyxin (TRIPLE ANTIBIOTIC) 3.5-9363963888 OINT Apply 1 application topically as needed. For minor skin tears or abrasion. Clean area with normal saline, apply ointment or equivalent, cover with bandaid or gauze and tape and change as needed until healed., Historical Med    ranitidine (ZANTAC) 300 MG tablet Take 300 mg by mouth daily., Historical Med    senna (SENOKOT) 8.6 MG TABS tablet Take 1 tablet by mouth daily., Historical Med    sertraline (ZOLOFT)  50 MG tablet Take 50 mg by mouth daily., Historical Med    simvastatin (ZOCOR) 40 MG tablet Take 1 tablet (40 mg total) by mouth at bedtime., Starting Mon 01/15/2011, Until Tue 11/29/2015, Normal    traMADol (ULTRAM) 50 MG tablet Take 50 mg by mouth 2 (two) times daily. , Historical Med    zolpidem (AMBIEN) 5 MG tablet Take 5 mg by mouth at bedtime as needed for sleep., Historical Med      STOP taking these medications     omeprazole (PRILOSEC) 40 MG capsule      QUEtiapine (SEROQUEL) 25 MG tablet      vitamin B-12 (CYANOCOBALAMIN) 1000 MCG tablet          DISCHARGE INSTRUCTIONS:    Follow with PMD in 1-2 weeks.  If you experience worsening of your admission symptoms, develop shortness of breath, life threatening emergency, suicidal or homicidal thoughts you must seek medical attention immediately by calling 911 or calling your MD immediately  if symptoms less severe.  You Must read complete instructions/literature along with all the possible adverse reactions/side effects for all the Medicines you take and that have been prescribed to you. Take any new Medicines after you have completely understood and accept all the possible adverse reactions/side effects.   Please note  You were cared for by a hospitalist during your hospital stay. If you have any questions about your discharge medications or the care you received while you were in the hospital after you are discharged, you can call the unit and asked to speak with the hospitalist on call if the hospitalist that took care of you is not available. Once you are discharged, your primary care physician will handle any further medical issues. Please note that NO REFILLS for any discharge medications will be authorized once you are discharged, as it is imperative that you return to your primary care physician (or establish a relationship with a primary care physician if you do not have one) for your aftercare needs so that they can  reassess your need for medications and monitor your lab values.    Today   CHIEF COMPLAINT:   Chief Complaint  Patient presents with  . Chest Pain    HISTORY OF PRESENT ILLNESS:  Shaindel Sweeten  is a 70 y.o. female presents with chest pain. This chest pain was all day yesterday and part of the day today. Left side of the chest described as a constant ache. Not that severe. If she sleeping she doesn't feel it. A few nights ago she had left arm numbness which woke her up from sleep. Today he also had left arm numbness and tingling. No shortness of breath. Some sweating. No nausea. In the ER first cardiac enzyme was negative and hospitalist services were contacted for further evaluation  VITAL SIGNS:  Blood pressure (!) 146/79, pulse 84, temperature 98.1 F (36.7 C), temperature source Oral, resp. rate 20, height 5\' 5"  (1.651 m), weight 90.1 kg (198 lb 9.6 oz), SpO2 99 %.  I/O:  No intake or output data in the 24 hours ending 12/04/15 1528  PHYSICAL EXAMINATION:  GENERAL:  70 y.o.-year-old patient lying in the bed with no acute distress.  EYES: Pupils equal, round, reactive to light and accommodation. No scleral icterus. Extraocular muscles intact.  HEENT: Head atraumatic, normocephalic. Oropharynx and nasopharynx clear.  NECK:  Supple, no jugular venous distention. No thyroid enlargement, no tenderness.  LUNGS: Normal breath sounds bilaterally, no wheezing, rales,rhonchi or crepitation. No use of accessory muscles of respiration.  CARDIOVASCULAR: S1, S2 normal. No murmurs, rubs, or gallops.  ABDOMEN: Soft, non-tender, non-distended. Bowel sounds present. No organomegaly or mass.  EXTREMITIES: No pedal edema, cyanosis, or clubbing.  NEUROLOGIC: Cranial nerves II through XII are intact. Muscle strength 5/5 in all extremities. Sensation intact. Gait not checked.  PSYCHIATRIC: The patient is alert and oriented x 3.  SKIN: No obvious rash, lesion, or ulcer.   DATA REVIEW:   CBC  Recent  Labs Lab 11/30/15 0333  WBC 7.5  HGB 13.1  HCT 37.5  PLT 192    Chemistries   Recent Labs Lab 11/30/15 0333  NA 140  K 3.6  CL 111  CO2 23  GLUCOSE 91  BUN 14  CREATININE 0.49  CALCIUM 8.9    Cardiac Enzymes  Recent Labs Lab 11/30/15 0333  TROPONINI <0.03    Microbiology Results  Results for orders placed or performed during the hospital encounter of 07/18/15  Surgical pcr screen     Status: None   Collection Time: 07/18/15  1:05 PM  Result Value Ref Range Status   MRSA, PCR NEGATIVE NEGATIVE Final   Staphylococcus aureus NEGATIVE NEGATIVE Final    Comment:        The Xpert SA Assay (FDA approved for NASAL specimens in patients over 72 years of age), is one component of a comprehensive surveillance program.  Test performance has been validated by [facility] for patients greater than or equal to 72 year old. It is not intended to diagnose infection nor to guide or monitor treatment.   Urine culture     Status: Abnormal   Collection Time: 07/18/15  1:05 PM  Result Value Ref Range Status   Specimen Description URINE, CLEAN CATCH  Final   Special Requests NONE  Final   Culture MULTIPLE SPECIES PRESENT, SUGGEST RECOLLECTION (A)  Final   Report Status 07/19/2015 FINAL  Final    RADIOLOGY:  No results found.  EKG:   Orders placed or performed during the hospital encounter of 11/29/15  . EKG 12-Lead  . EKG 12-Lead      Management plans discussed with the patient, family and they are in agreement.  CODE STATUS:  Code Status History    Date Active Date Inactive Code Status Order ID Comments User Context   11/29/2015  7:14 PM 11/30/2015 10:12 PM Full Code [id number]  Alford Highlandichard Wieting, MD ED      TOTAL TIME TAKING CARE OF THIS PATIENT: 35 minutes.    Altamese DillingVACHHANI, Jakaylee Sasaki M.D on 12/04/2015 at 3:28 PM  Between 7am to 6pm - Pager - [phone]  After 6pm go to [email or web address] - password [email or web address]  [facility]  Office   [phone]  CC: Primary care physician; Oliver BarreJames John, MD   Note: This dictation was  prepared with Dragon dictation along with smaller phrase technology. Any transcriptional errors that result from this process are unintentional.

## 2016-02-15 ENCOUNTER — Other Ambulatory Visit: Payer: Self-pay | Admitting: Orthopedic Surgery

## 2016-02-15 DIAGNOSIS — M4722 Other spondylosis with radiculopathy, cervical region: Secondary | ICD-10-CM

## 2016-10-25 NOTE — H&P (Signed)
[name] is a 71 y.o. female here for Fractional D+C and hysteroscopy .pt with PMB and underwent an EMBX ( benign)  Pap smear showed LGSIL and cxbx results CIN1  Saline results show an endometrial polyp 8x7 mm  No additional bleeding  Past Medical History:  has a past medical history of Anxiety, unspecified; Colon polyps (2007); Dementia; Depression, unspecified; Duodenitis (01/03/2015); GERD without esophagitis; History of anemia; Hyperlipidemia, unspecified; and Hypertension.  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 Family History: family history is not on file. Social History:  reports that she has been smoking.  She has a 5.00 pack-year smoking history. She has never used smokeless tobacco. She reports that she does not drink alcohol or use drugs. OB/GYN History:          OB History    Gravida Para Term Preterm AB Living   2 2       2    SAB TAB Ectopic Molar Multiple Live Births             2      Allergies: is allergic to aspirin; atorvastatin; codeine; and morphine. Medications:  Current Outpatient Prescriptions:  .  acetaminophen (TYLENOL) 500 mg capsule, Take 500 mg by mouth every 4 (four) hours as needed for Fever., Disp: , Rfl:  .  aluminum-magnesium hydroxide-simethicone, DRAH, (MYLANTA,MI-ACID) 200-200-20 mg/5 mL suspension, Take 30 mLs by mouth every 6 (six) hours as needed for Indigestion., Disp: , Rfl:  .  buPROPion (WELLBUTRIN XL) 300 MG XL tablet, Take 300 mg by mouth once daily., Disp: , Rfl:  .  busPIRone (BUSPAR) 5 MG tablet, Take 5 mg by mouth 2 (two) times daily., Disp: , Rfl:  .  diclofenac (VOLTAREN) 1 % topical gel, Apply 4 g topically 4 (four) times daily., Disp: 100 g, Rfl: 11 .  dicyclomine (BENTYL) 10 mg capsule, Take 10 mg by mouth 4 (four) times daily  before meals and nightly., Disp: , Rfl:  .  donepezil (ARICEPT) 10 MG tablet, Take 10 mg by mouth nightly., Disp: , Rfl:  .  loperamide (IMODIUM) 2 mg capsule, Take 2 mg by mouth as needed for Diarrhea., Disp: , Rfl:  .  LORazepam (ATIVAN) 0.5 MG tablet, Take 0.5 mg by mouth every 8 (eight) hours as needed for Anxiety., Disp: , Rfl:  .  LORazepam (ATIVAN) 1 MG tablet, Take 1 mg by mouth nightly as needed for Anxiety., Disp: , Rfl:  .  magnesium hydroxide (MILK OF MAGNESIA) 400 mg/5 mL suspension, Take 30 mLs by mouth once daily as needed for Constipation., Disp: , Rfl:  .  melatonin 5 mg Cap, Take 5 mg by mouth nightly., Disp: , Rfl:  .  omeprazole (PRILOSEC) 40 MG DR capsule, Take 40 mg by mouth once daily., Disp: , Rfl:  .  pregabalin (LYRICA) 75 MG capsule, Take 100 mg by mouth 2 (two) times daily.  , Disp: , Rfl:  .  sennosides (SENOKOT) 8.6 mg tablet, Take 1 tablet by mouth once daily., Disp: , Rfl:  .  sertraline (ZOLOFT) 50 MG tablet, Take 75 mg by mouth once daily., Disp: , Rfl:  .  simvastatin (ZOCOR) 40 MG tablet, Take 40 mg by mouth nightly., Disp: , Rfl:  .  traMADol (ULTRAM) 50 mg tablet, Take 1 tablet by mouth once daily., Disp: , Rfl:  .  traZODone (DESYREL) 100 MG tablet, Take 100 mg by mouth nightly., Disp: , Rfl:  .  trolamine salicylate (ASPERCREME) 10 % cream, Apply topically as needed., Disp: , Rfl:   Review of Systems: General:                      No fatigue or weight loss Eyes:                           No vision changes Ears:                            No hearing difficulty Respiratory:                No cough or shortness of breath Pulmonary:                  No asthma or shortness of breath Cardiovascular:           No chest pain, palpitations, dyspnea on exertion Gastrointestinal:          No abdominal bloating, chronic diarrhea, constipations, masses, pain or hematochezia Genitourinary:             No hematuria, dysuria, abnormal vaginal discharge, pelvic  pain,Menometrorrhagia, + PMB   Lymphatic:                   No swollen lymph nodes Musculoskeletal:         No muscle weakness Neurologic:                  No extremity weakness, syncope, seizure disorder Psychiatric:                  No history of depression, delusions or suicidal/homicidal ideation    Exam:      Vitals:   10/22/16 1529  BP: 122/78  Pulse: 83    Body mass index is 34.54 kg/m.  WDWN white/ female in NAD   Lungs: CTA  CV : RRR without murmur   Neck:  no thyromegaly Abdomen: soft , no mass, normal active bowel sounds,  non-tender, no rebound tenderness Pelvic: tanner stage 5 ,  External genitalia: vulva /labia no lesions Urethra: no prolapse Vagina: normal physiologic d/c Cervix: no lesions, no cervical motion tenderness   Uterus: normal size shape and contour, non-tender Adnexa: no mass,  non-tender   Saline infusion sonohysterography: betadine prep to the cervix followed by placement of the HSG catheter into the endometrial canal . Sterile H2O is injected while performing a transvaginal u/s . Findings:8x7 mm hyperechoic  Endometrial mass  Impression:   The primary encounter diagnosis was PMB (postmenopausal bleeding). A diagnosis of Endometrial polyp was also pertinent to this visit.    Plan:   Benefits and risks to surgery: Fractional D+C and H/S and endometrial polypectomy  The proposed benefit of the surgery has been discussed with the patient. The possible risks include, but are not limited to: organ injury to the bowel , bladder, ureters, and major blood vessels and nerves. There is a possibility of additional surgeries resulting from these injuries. There is also the risk of blood transfusion and the need to receive blood products during or after the procedure which may rarely lead to HIV or Hepatitis C infection. There is a risk of developing a deep venous thrombosis or a pulmonary embolism . There is the possibility of wound  infection  and also anesthetic complications, even the rare possibility of death. The patient understands these risks and wishes to proceed. All questions have been answered and the consent has been signed.    Vilma Prader, MD

## 2016-10-26 ENCOUNTER — Inpatient Hospital Stay: Admission: RE | Admit: 2016-10-26 | Payer: Medicare Other | Source: Ambulatory Visit

## 2016-11-01 ENCOUNTER — Encounter
Admission: RE | Admit: 2016-11-01 | Discharge: 2016-11-01 | Disposition: A | Discharge status: Home / Self Care | Payer: Medicare Other | Source: Ambulatory Visit | Attending: Obstetrics and Gynecology | Admitting: Obstetrics and Gynecology

## 2016-11-01 DIAGNOSIS — Z01810 Encounter for preprocedural cardiovascular examination: Secondary | ICD-10-CM | POA: Diagnosis present

## 2016-11-01 DIAGNOSIS — R9431 Abnormal electrocardiogram [ECG] [EKG]: Secondary | ICD-10-CM | POA: Insufficient documentation

## 2016-11-01 DIAGNOSIS — Z01812 Encounter for preprocedural laboratory examination: Secondary | ICD-10-CM | POA: Insufficient documentation

## 2016-11-01 LAB — CBC
HCT: 40 % (ref 35.0–47.0)
Hemoglobin: 14 g/dL (ref 12.0–16.0)
MCH: 32.6 pg (ref 26.0–34.0)
MCHC: 34.9 g/dL (ref 32.0–36.0)
MCV: 93.3 fL (ref 80.0–100.0)
Platelets: 208 10*3/uL (ref 150–440)
RBC: 4.28 MIL/uL (ref 3.80–5.20)
RDW: 13.4 % (ref 11.5–14.5)
WBC: 8 10*3/uL (ref 3.6–11.0)

## 2016-11-01 LAB — BASIC METABOLIC PANEL
ANION GAP: 9 (ref 5–15)
BUN: 11 mg/dL (ref 6–20)
CALCIUM: 9.5 mg/dL (ref 8.9–10.3)
CO2: 24 mmol/L (ref 22–32)
Chloride: 105 mmol/L (ref 101–111)
Creatinine, Ser: 0.66 mg/dL (ref 0.44–1.00)
GFR calc Af Amer: >60 mL/min (ref >60)
Glucose, Bld: 103 mg/dL — ABNORMAL HIGH (ref 65–99)
Potassium: 3.8 mmol/L (ref 3.5–5.1)
SODIUM: 138 mmol/L (ref 135–145)

## 2016-11-01 LAB — TYPE AND SCREEN
ABO/RH(D): B POS
ANTIBODY SCREEN: NEGATIVE

## 2016-11-01 NOTE — Patient Instructions (Signed)
Your procedure is scheduled on: Monday, November 05, 2016 Report to Same Day Surgery on the 2nd floor in the [facility]. To find out your arrival time, please call [phone] between 1PM - 3PM on: Friday, November 02, 2016  REMEMBER: Instructions that are not followed completely may result in serious medical risk up to and including death; or upon the discretion of your surgeon and anesthesiologist your surgery may need to be rescheduled.  Do not eat food or drink liquids after midnight. No gum chewing or hard candies.  You may however, drink CLEAR liquids up to 2 hours before you are scheduled to arrive at the hospital for your procedure.  Do not drink clear liquids within 2 hours of your scheduled arrival to the hospital as this may lead to your procedure being delayed or rescheduled.  Clear liquids include: - water  - apple juice without pulp - clear gatorade - black coffee or tea (NO milk, creamers, sugars) DO NOT drink anything not on this list.  Type 1 and Type 2 diabetics should only drink water.  No Alcohol for 24 hours before or after surgery.  No Smoking for 24 hours prior to surgery.  Notify your doctor if there is any change in your medical condition (cold, fever, infection).  Do not wear jewelry, make-up, hairpins, clips or nail polish.  Do not wear lotions, powders, or perfumes.   Do not shave 48 hours prior to surgery. Men may shave face and neck.  Contacts and dentures may not be worn into surgery.  Do not bring valuables to the hospital. [facility] is not responsible for any belongings or valuables.   TAKE THESE MEDICATIONS THE MORNING OF SURGERY WITH A SIP OF WATER:  1.  BUPROPION 2.  BUSPIRONE 3.  OMEPRAZOLE (take one capsule the night before surgery and one capsule the morning of surgery) - helps prevent nausea after surgery 4.  PREGABALIN  NOW!  Stop Anti-inflammatories such as Advil, Aleve, Ibuprofen, Motrin, Naproxen, Naprosyn, Goodie powder,  or aspirin products. (May take Tylenol if needed.)  Stop supplements until after surgery. (May continue Vitamin D, Vitamin B, and multivitamin.)  If you are being discharged the day of surgery, you will not be allowed to drive home. You will need someone to drive you home and stay with you that night.   If you are taking public transportation, you will need to have a responsible adult to with you.  Please call the number above if you have any questions about these instructions.

## 2016-11-02 NOTE — Pre-Procedure Instructions (Signed)
EKG reading from 11/01/2016 was brought to the attention of Dr. Judie PetitM. Maisie Fushomas, anesthesiologist for pre-op clearance. After reviewing it with previous EKG's, he felt that this new EKG was not a significant change from the previous ones.

## 2016-11-04 IMAGING — CR DG HIP (WITH PELVIS) OPERATIVE*R*
4 series · 4 of 4 positions shown · non-contrast
Comparison: MRI 05/23/2015 and KUB 01/08/2009

CLINICAL DATA: Right hip replacement.

EXAM:
OPERATIVE right HIP (WITH PELVIS IF PERFORMED) 4 VIEWS as well as 2
postop views right hip.
TECHNIQUE: Fluoroscopic spot image(s) were submitted for interpretation
post-operatively.

[cont. (1 of 4)]
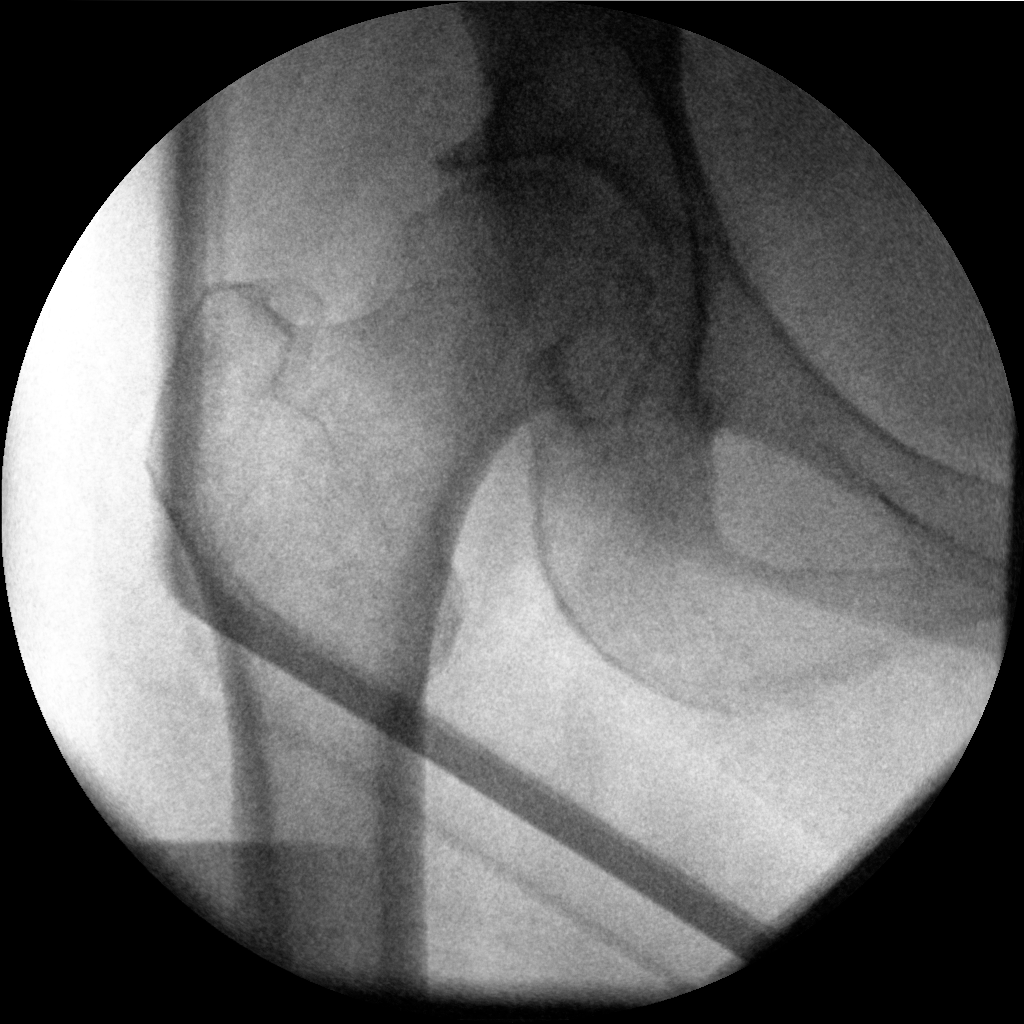

[cont. (2 of 4)]
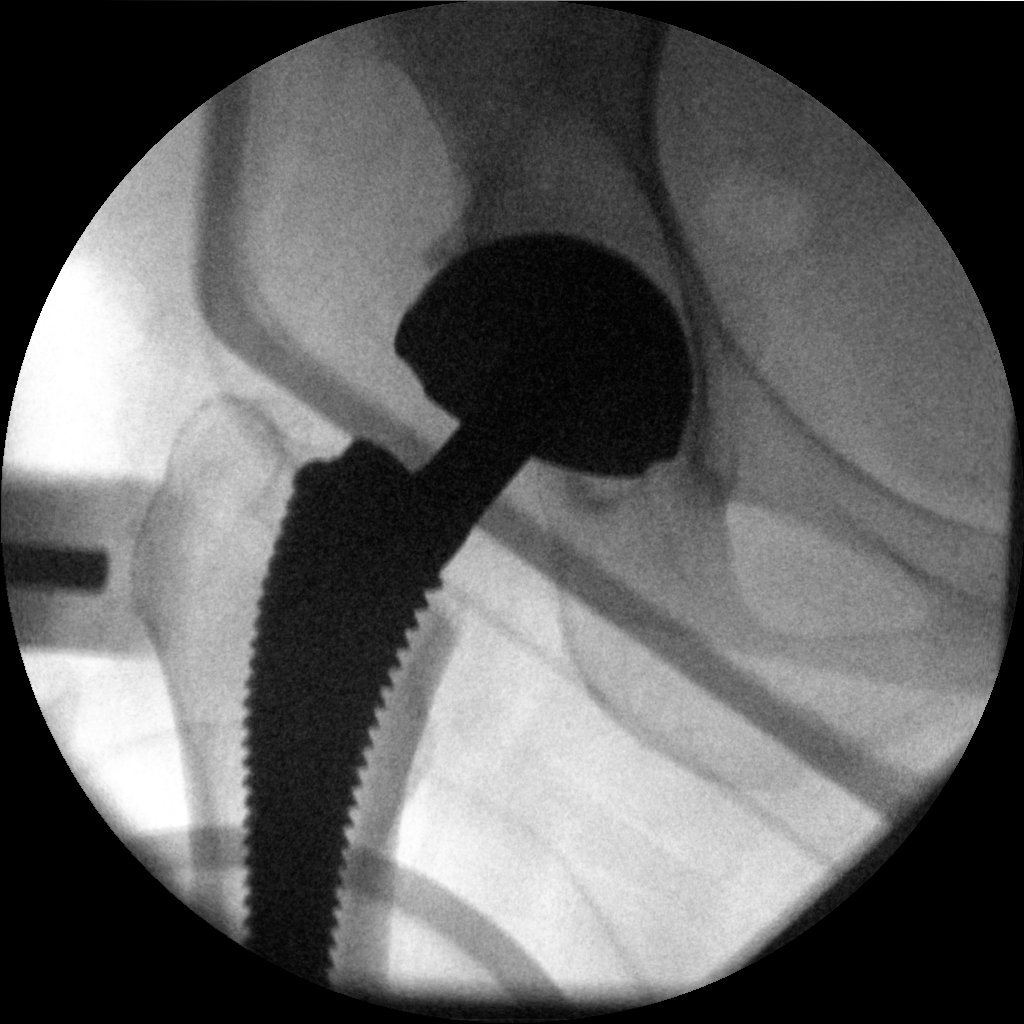

[cont. (3 of 4)]
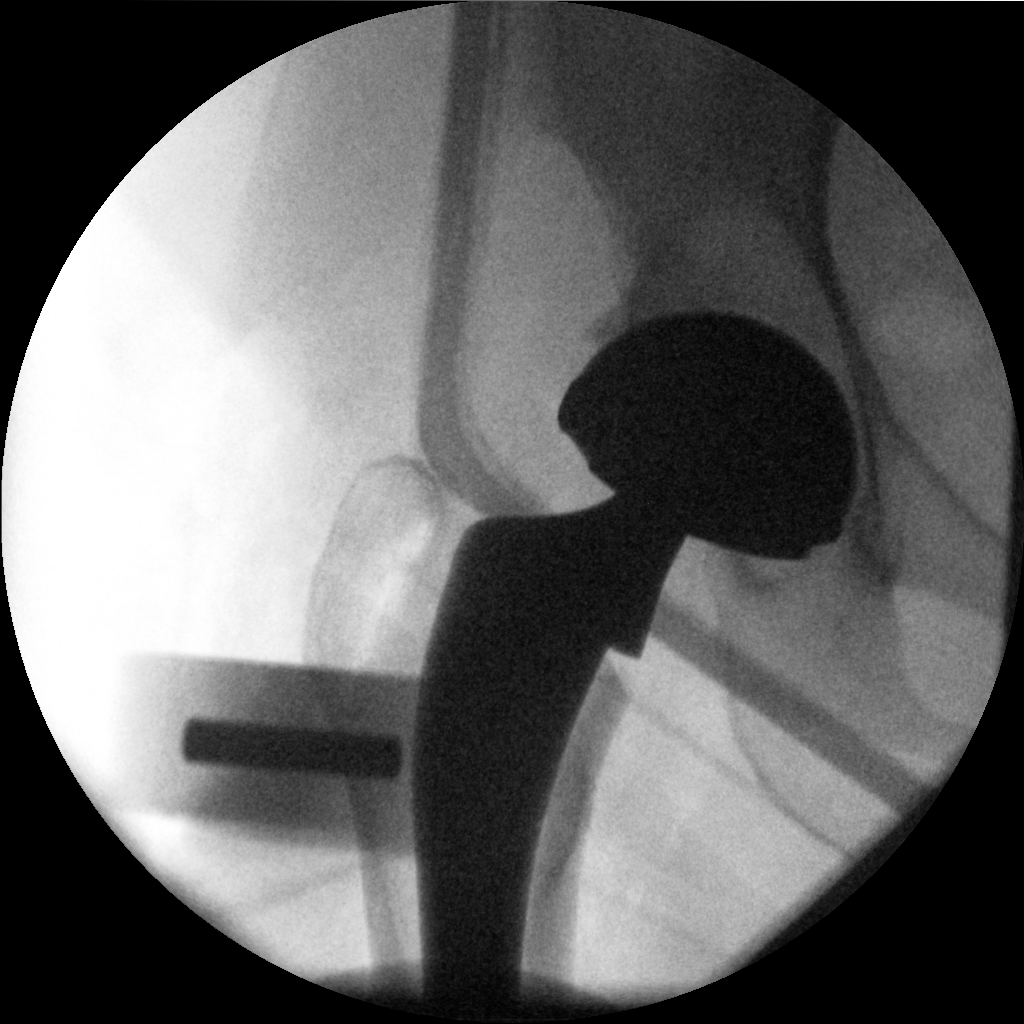

[cont. (4 of 4)]
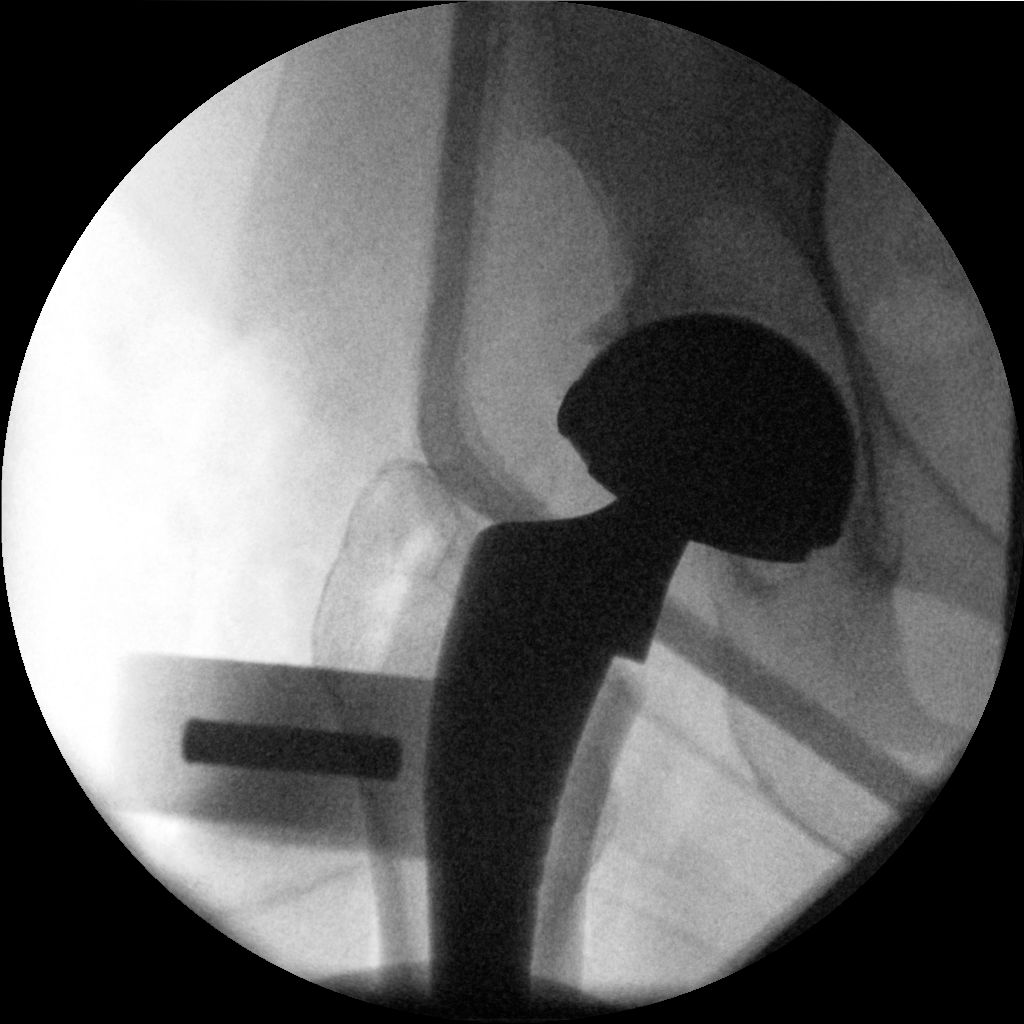

[4 of 4 positions shown; findings below may reference images not displayed]

FINDINGS: Examination demonstrates placement of a right hip arthroplasty and
adequate position and intact. Skin staples are present over the
lateral soft tissues. Surgical drain is present over the surgical
bed.
IMPRESSION: Expected changes post right hip arthroplasty.

## 2016-11-04 MED ORDER — CEFOXITIN SODIUM-DEXTROSE 2-2.2 GM-% IV SOLR (PREMIX)
2.0000 g | INTRAVENOUS | Status: AC
  Administered 2016-11-05: 2 g via INTRAVENOUS

## 2016-11-05 ENCOUNTER — Ambulatory Visit
Admission: RE | Admit: 2016-11-05 | Discharge: 2016-11-05 | Disposition: A | Discharge status: Home / Self Care | Payer: Medicare Other | Source: Ambulatory Visit | Attending: Obstetrics and Gynecology | Admitting: Obstetrics and Gynecology

## 2016-11-05 ENCOUNTER — Encounter
Admission: RE | Disposition: A | Discharge status: Home / Self Care | Payer: Self-pay | Source: Ambulatory Visit | Attending: Obstetrics and Gynecology

## 2016-11-05 ENCOUNTER — Ambulatory Visit: Payer: Medicare Other | Admitting: Certified Registered Nurse Anesthetist

## 2016-11-05 ENCOUNTER — Encounter: Payer: Self-pay | Admitting: Anesthesiology

## 2016-11-05 ENCOUNTER — Ambulatory Visit: Payer: Medicare Other | Admitting: Anesthesiology

## 2016-11-05 DIAGNOSIS — Z886 Allergy status to analgesic agent status: Secondary | ICD-10-CM | POA: Insufficient documentation

## 2016-11-05 DIAGNOSIS — E785 Hyperlipidemia, unspecified: Secondary | ICD-10-CM | POA: Insufficient documentation

## 2016-11-05 DIAGNOSIS — Z96641 Presence of right artificial hip joint: Secondary | ICD-10-CM | POA: Insufficient documentation

## 2016-11-05 DIAGNOSIS — F0390 Unspecified dementia without behavioral disturbance: Secondary | ICD-10-CM | POA: Insufficient documentation

## 2016-11-05 DIAGNOSIS — Z888 Allergy status to other drugs, medicaments and biological substances status: Secondary | ICD-10-CM | POA: Insufficient documentation

## 2016-11-05 DIAGNOSIS — N950 Postmenopausal bleeding: Secondary | ICD-10-CM | POA: Diagnosis present

## 2016-11-05 DIAGNOSIS — K219 Gastro-esophageal reflux disease without esophagitis: Secondary | ICD-10-CM | POA: Insufficient documentation

## 2016-11-05 DIAGNOSIS — F329 Major depressive disorder, single episode, unspecified: Secondary | ICD-10-CM | POA: Diagnosis not present

## 2016-11-05 DIAGNOSIS — Z79899 Other long term (current) drug therapy: Secondary | ICD-10-CM | POA: Diagnosis not present

## 2016-11-05 DIAGNOSIS — I10 Essential (primary) hypertension: Secondary | ICD-10-CM | POA: Insufficient documentation

## 2016-11-05 DIAGNOSIS — F419 Anxiety disorder, unspecified: Secondary | ICD-10-CM | POA: Diagnosis not present

## 2016-11-05 DIAGNOSIS — N840 Polyp of corpus uteri: Secondary | ICD-10-CM | POA: Insufficient documentation

## 2016-11-05 DIAGNOSIS — F17210 Nicotine dependence, cigarettes, uncomplicated: Secondary | ICD-10-CM | POA: Insufficient documentation

## 2016-11-05 DIAGNOSIS — Z885 Allergy status to narcotic agent status: Secondary | ICD-10-CM | POA: Diagnosis not present

## 2016-11-05 HISTORY — PX: HYSTEROSCOPY W/D&C: SHX1775

## 2016-11-05 HISTORY — PX: HYSTEROSCOPY WITH D & C: SHX1775

## 2016-11-05 SURGERY — DILATATION AND CURETTAGE /HYSTEROSCOPY
Anesthesia: General | Wound class: Clean Contaminated

## 2016-11-05 MED ORDER — FENTANYL CITRATE (PF) 100 MCG/2ML IJ SOLN
INTRAMUSCULAR | Status: DC | PRN
  Administered 2016-11-05 (×2): 50 ug via INTRAVENOUS

## 2016-11-05 MED ORDER — FENTANYL CITRATE (PF) 100 MCG/2ML IJ SOLN
INTRAMUSCULAR | Status: AC
  Filled 2016-11-05: qty 2

## 2016-11-05 MED ORDER — MIDAZOLAM HCL 2 MG/2ML IJ SOLN
INTRAMUSCULAR | Status: AC
  Filled 2016-11-05: qty 2

## 2016-11-05 MED ORDER — FENTANYL CITRATE (PF) 100 MCG/2ML IJ SOLN
25.0000 ug | INTRAMUSCULAR | Status: DC | PRN
  Administered 2016-11-05: 50 ug via INTRAVENOUS

## 2016-11-05 MED ORDER — GLYCOPYRROLATE 0.2 MG/ML IJ SOLN
INTRAMUSCULAR | Status: DC | PRN
  Administered 2016-11-05: 0.2 mg via INTRAVENOUS

## 2016-11-05 MED ORDER — LIDOCAINE HCL (PF) 2 % IJ SOLN
INTRAMUSCULAR | Status: AC
  Filled 2016-11-05: qty 4

## 2016-11-05 MED ORDER — LIDOCAINE HCL (CARDIAC) 20 MG/ML IV SOLN
INTRAVENOUS | Status: DC | PRN
  Administered 2016-11-05: 80 mg via INTRAVENOUS

## 2016-11-05 MED ORDER — ONDANSETRON HCL 4 MG/2ML IJ SOLN
INTRAMUSCULAR | Status: DC | PRN
  Administered 2016-11-05: 4 mg via INTRAVENOUS

## 2016-11-05 MED ORDER — KETOROLAC TROMETHAMINE 30 MG/ML IJ SOLN
INTRAMUSCULAR | Status: AC
  Filled 2016-11-05: qty 1

## 2016-11-05 MED ORDER — MIDAZOLAM HCL 2 MG/2ML IJ SOLN
INTRAMUSCULAR | Status: DC | PRN
  Administered 2016-11-05: 1 mg via INTRAVENOUS

## 2016-11-05 MED ORDER — PROPOFOL 10 MG/ML IV BOLUS
INTRAVENOUS | Status: AC
  Filled 2016-11-05: qty 20

## 2016-11-05 MED ORDER — LACTATED RINGERS IV SOLN
INTRAVENOUS | Status: DC
  Administered 2016-11-05: 13:00:00 via INTRAVENOUS

## 2016-11-05 MED ORDER — ACETAMINOPHEN 10 MG/ML IV SOLN
INTRAVENOUS | Status: DC | PRN
  Administered 2016-11-05: 1000 mg via INTRAVENOUS

## 2016-11-05 MED ORDER — ACETAMINOPHEN 10 MG/ML IV SOLN
INTRAVENOUS | Status: AC
  Filled 2016-11-05: qty 100

## 2016-11-05 MED ORDER — ONDANSETRON HCL 4 MG/2ML IJ SOLN
INTRAMUSCULAR | Status: AC
  Filled 2016-11-05: qty 2

## 2016-11-05 MED ORDER — CEFOXITIN SODIUM-DEXTROSE 2-2.2 GM-% IV SOLR (PREMIX)
INTRAVENOUS | Status: AC
  Filled 2016-11-05: qty 50

## 2016-11-05 MED ORDER — DEXAMETHASONE SODIUM PHOSPHATE 10 MG/ML IJ SOLN
INTRAMUSCULAR | Status: AC
  Filled 2016-11-05: qty 1

## 2016-11-05 MED ORDER — PROPOFOL 10 MG/ML IV BOLUS
INTRAVENOUS | Status: DC | PRN
  Administered 2016-11-05: 150 mg via INTRAVENOUS

## 2016-11-05 MED ORDER — KETOROLAC TROMETHAMINE 30 MG/ML IJ SOLN
INTRAMUSCULAR | Status: DC | PRN
  Administered 2016-11-05: 30 mg via INTRAVENOUS

## 2016-11-05 MED ORDER — DEXAMETHASONE SODIUM PHOSPHATE 10 MG/ML IJ SOLN
INTRAMUSCULAR | Status: DC | PRN
  Administered 2016-11-05: 10 mg via INTRAVENOUS

## 2016-11-05 MED ORDER — LACTATED RINGERS IR SOLN
Status: DC | PRN
  Administered 2016-11-05: 750 mL

## 2016-11-05 SURGICAL SUPPLY — 19 items
CANISTER SUCT 3000ML PPV (MISCELLANEOUS) ×3 IMPLANT
CATH ROBINSON RED A/P 16FR (CATHETERS) ×3 IMPLANT
DEVICE MYOSURE LITE (MISCELLANEOUS) ×2 IMPLANT
GLOVE BIO SURGEON STRL SZ8 (GLOVE) ×3 IMPLANT
GOWN STRL REUS W/ TWL LRG LVL3 (GOWN DISPOSABLE) ×1 IMPLANT
GOWN STRL REUS W/ TWL XL LVL3 (GOWN DISPOSABLE) ×1 IMPLANT
GOWN STRL REUS W/TWL LRG LVL3 (GOWN DISPOSABLE) ×3
GOWN STRL REUS W/TWL XL LVL3 (GOWN DISPOSABLE) ×3
IV LACTATED RINGER IRRG 3000ML (IV SOLUTION) ×3
IV LACTATED RINGERS 1000ML (IV SOLUTION) ×3 IMPLANT
IV LR IRRIG 3000ML ARTHROMATIC (IV SOLUTION) IMPLANT
KIT RM TURNOVER CYSTO AR (KITS) ×3 IMPLANT
PACK DNC HYST (MISCELLANEOUS) ×3 IMPLANT
PAD OB MATERNITY 4.3X12.25 (PERSONAL CARE ITEMS) ×3 IMPLANT
PAD PREP 24X41 OB/GYN DISP (PERSONAL CARE ITEMS) ×3 IMPLANT
TOWEL OR 17X26 4PK STRL BLUE (TOWEL DISPOSABLE) ×3 IMPLANT
TUBING CONNECTING 10 (TUBING) ×2 IMPLANT
TUBING CONNECTING 10' (TUBING) ×1
TUBING HYSTEROSCOPY DOLPHIN (MISCELLANEOUS) ×2 IMPLANT

## 2016-11-05 NOTE — Anesthesia Post-op Follow-up Note (Signed)
Anesthesia QCDR form completed.        

## 2016-11-05 NOTE — Anesthesia Preprocedure Evaluation (Signed)
Anesthesia Evaluation  Patient identified by MRN, date of birth, ID band Patient awake    Reviewed: Allergy & Precautions, H&P , NPO status , Patient's Chart, lab work & pertinent test results  History of Anesthesia Complications Negative for: history of anesthetic complications  Airway Mallampati: III  TM Distance: <3 FB Neck ROM: limited    Dental  (+) Poor Dentition, Chipped, Missing   Pulmonary neg shortness of breath, Current Smoker,           Cardiovascular Exercise Tolerance: Good hypertension, (-) angina(-) Past MI and (-) DOE      Neuro/Psych  Headaches, PSYCHIATRIC DISORDERS Anxiety Depression  Neuromuscular disease    GI/Hepatic Neg liver ROS, GERD  Medicated and Controlled,  Endo/Other  negative endocrine ROS  Renal/GU      Musculoskeletal  (+) Arthritis ,   Abdominal   Peds  Hematology negative hematology ROS (+)   Anesthesia Other Findings Past Medical History: No date: ALCOHOL ABUSE, HX OF No date: ANEMIA-NOS No date: ANXIETY No date: CERVICAL RADICULOPATHY, LEFT No date: DEGENERATIVE DISC DISEASE, CERVICAL SPINE No date: DEGENERATIVE DISC DISEASE, CERVICAL SPINE, W/RADICULOPATHY No date: Depressive disorder, not elsewhere classified 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) No date: DIVERTICULITIS, HX OF No date: DIZZINESS No date: GERD No date: HYPERLIPIDEMIA No date: HYPERTENSION No date: Insomnia, unspecified No date: LOW BACK PAIN No date: Memory loss No date: MIGRAINE HEADACHE No date: Palpitation No date: WEIGHT LOSS  Past Surgical History: No date: BACK SURGERY No date: CERVICAL FUSION     Comment:  [place] No date: DILATION AND CURETTAGE OF UTERUS 01/03/2015: ESOPHAGOGASTRODUODENOSCOPY; N/A     Comment:  Procedure: ESOPHAGOGASTRODUODENOSCOPY (EGD);  Surgeon:               Elnita MaxwellMatthew Gordon Rein, MD;  Location[facility] END[facility]COPY;                Service:  Endoscopy;  Laterality: N/A; No date: EYE SURGERY; Bilateral     Comment:  Cataract Extraction with IOL No date: LUMBAR FUSION No date: TONSILLECTOMY 08/04/2015: TOTAL HIP ARTHROPLASTY; Right     Comment:  Procedure: TOTAL HIP ARTHROPLASTY ANTERIOR APPROACH;                Surgeon: Kennedy BuckerMichael Menz, MD;  Location: [facility] ORS;  Service:              Orthopedics;  Laterality: Right;  BMI    Body Mass Index:  33.12 kg/m      Reproductive/Obstetrics negative OB ROS                             Anesthesia Physical Anesthesia Plan  ASA: III  Anesthesia Plan: General LMA   Post-op Pain Management:    Induction: Intravenous  PONV Risk Score and Plan: 3 and Ondansetron, Dexamethasone and Treatment may vary due to age or medical condition  Airway Management Planned: LMA  Additional Equipment:   Intra-op Plan:   Post-operative Plan: Extubation in OR  Informed Consent: I have reviewed the patients History and Physical, chart, labs and discussed the procedure including the risks, benefits and alternatives for the proposed anesthesia with the patient or authorized representative who has indicated his/her understanding and acceptance.   Dental Advisory Given  Plan Discussed with: Anesthesiologist, CRNA and Surgeon  Anesthesia Plan Comments: (Patient consented for risks of anesthesia including but not limited to:  - adverse reactions to  medications - damage to teeth, lips or other oral mucosa - sore throat or hoarseness - Damage to heart, brain, lungs or loss of life  Patient voiced understanding.)        Anesthesia Quick Evaluation

## 2016-11-05 NOTE — Anesthesia Procedure Notes (Signed)
Procedure Name: Intubation Date/Time: 11/05/2016 1:06 PM Performed by: Ginger CarneMICHELET, Numa Heatwole Pre-anesthesia Checklist: Patient identified, Emergency Drugs available, Suction available, Patient being monitored and Timeout performed Patient Re-evaluated:Patient Re-evaluated prior to induction Oxygen Delivery Method: Circle system utilized Preoxygenation: Pre-oxygenation with 100% oxygen Induction Type: IV induction LMA: LMA inserted LMA Size: 3.5 Grade View: Grade I Tube type: Oral Number of attempts: 1 Placement Confirmation: positive ETCO2 and breath sounds checked- equal and bilateral Tube secured with: Tape Dental Injury: Teeth and Oropharynx as per pre-operative assessment

## 2016-11-05 NOTE — Brief Op Note (Signed)
11/05/2016  1:36 PM  PATIENT:  Laurie JarvisLinda S Abshier  71 y.o. female  PRE-OPERATIVE DIAGNOSIS:  post menopausal bleeding endometrial polyp  POST-OPERATIVE DIAGNOSIS:  same PROCEDURE:  Procedure(s): DILATATION AND CURETTAGE /HYSTEROSCOPY (N/A) myosure resection of endometrial polyps  SURGEON:  Surgeon(s) and Role:    * Christine Schiefelbein, Ihor Austinhomas J, MD - Primary  PHYSICIAN ASSISTANT: none  ASSISTANTS: none   ANESTHESIA:   general  EBL:  Total I/O In: 300 [I.V.:300] Out: 102 [Urine:100; Blood:2]  BLOOD ADMINISTERED:none  DRAINS: none   LOCAL MEDICATIONS USED:  NONE  SPECIMEN:  Source of Specimen:  ecc , endometrial curretings , endometrial polyps   DISPOSITION OF SPECIMEN:  PATHOLOGY  COUNTS:  YES  TOURNIQUET:  * No tourniquets in log *  DICTATION: .Other Dictation: Dictation Number verbal  PLAN OF CARE: Discharge to home after PACU  PATIENT DISPOSITION:  PACU - hemodynamically stable.   Delay start of Pharmacological VTE agent (>24hrs) due to surgical blood loss or risk of bleeding: not applicable

## 2016-11-05 NOTE — Transfer of Care (Signed)
Immediate Anesthesia Transfer of Care Note  Patient: Laurie JarvisLinda S Hy  Procedure(s) Performed: Procedure(s): DILATATION AND CURETTAGE /HYSTEROSCOPY (N/A)  Patient Location: PACU  Anesthesia Type:General  Level of Consciousness: awake  Airway & Oxygen Therapy: Patient Spontanous Breathing and Patient connected to face mask oxygen  Post-op Assessment: Report given to RN and Post -op Vital signs reviewed and stable  Post vital signs: Reviewed and stable  Last Vitals:  Vitals:   11/05/16 1226  BP: 134/69  Pulse: 68  Resp: 16  Temp: 36.7 C  SpO2: 98%    Last Pain:  Vitals:   11/05/16 1226  TempSrc: Oral  PainSc: 3          Complications: No apparent anesthesia complications

## 2016-11-06 ENCOUNTER — Encounter: Payer: Self-pay | Admitting: Obstetrics and Gynecology

## 2016-11-06 NOTE — Op Note (Signed)
NAMHaig Prophet:  Whidbee, Ellenie                  ACCOUNT NO.:  [id number]  MEDICAL RECORD NO.:  [id number]  LOCATION:                                 FACILITY:  PHYSICIAN:  Jennell Cornerhomas Adely Facer, MD     DATE OF BIRTH:  DATE OF PROCEDURE:  11/05/2016 DATE OF DISCHARGE:                              OPERATIVE REPORT   PREOPERATIVE DIAGNOSIS: 1. Postmenopausal bleeding. 2. Endometrial polyp.  POSTOPERATIVE DIAGNOSIS: 1. Postmenopausal bleeding. 2. Endometrial polyp.  PROCEDURE PERFORMED: 1. Fractional dilation and curettage. 2. MyoSure resection of endometrial polyps.  SURGEON:  Jennell Cornerhomas Lavarius Doughten, MD  ANESTHESIA:  General endotracheal anesthesia.  INDICATION:  A 71 year old gravida 2, para 2 patient with postmenopausal bleeding and underwent an endometrial biopsy in the office, which was benign.  Saline infusion sonohysterography demonstrates 8 x 7 cm endometrial polyp.  DESCRIPTION OF PROCEDURE:  After adequate general endotracheal anesthesia, the patient was placed in dorsal supine position, legs in the candy-cane stirrups.  Lower abdomen, perineal, and vagina prep were performed.  The patient did receive 2 g IV cefoxitin prior to commencement of the case.  Time-out was performed.  A weighted speculum was placed in the posterior vaginal vault and the anterior cervix was grasped with a single-tooth tenaculum.  Uterus sounded to 7.5 cm, followed by performing an endocervical curettage.  Cervix was then dilated to #18 Hanks dilator and the hysteroscope was advanced into the endometrial cavity.  Lactated Ringer was used as distending medium, and there were 3 separate endometrial polyps that were noted.  MyoSure was brought up to the operative field and MyoSure resection of the 3 separate polyps was performed without difficulty.  Good hemostasis was noted.  The hysteroscope and the MyoSure were removed and a gentle endometrial curettage was performed with scant tissue.  Good  hemostasis noted.  Single-tooth tenaculum was taken off the anterior cervix with good hemostasis.  There were no complications.  ESTIMATED BLOOD LOSS:  Minimal.  INTRAOPERATIVE FLUIDS:  350 mL.  URINE OUTPUT:  100 mL via red Robinson catheter prior to commencement of the case, and net deficit from the MyoSure was 200 mL.  The patient was taken to recovery room in good condition.    ______________________________ Jennell Cornerhomas Raygan Skarda, MD   ______________________________ Jennell Cornerhomas Ryla Cauthon, MD    TS/MEDQ  D:  11/05/2016  T:  11/05/2016  Job:  [id number]

## 2016-11-06 NOTE — Anesthesia Postprocedure Evaluation (Signed)
Anesthesia Post Note  Patient: Pilar JarvisLinda S Teters  Procedure(s) Performed: Procedure(s) (LRB): DILATATION AND CURETTAGE /HYSTEROSCOPY/MYOSURE RESECTION OF ENDOMETRIAL POLYP (N/A)  Patient location during evaluation: PACU Anesthesia Type: General Level of consciousness: awake and alert Pain management: pain level controlled Vital Signs Assessment: post-procedure vital signs reviewed and stable Respiratory status: spontaneous breathing, nonlabored ventilation, respiratory function stable and patient connected to nasal cannula oxygen Cardiovascular status: blood pressure returned to baseline and stable Postop Assessment: no signs of nausea or vomiting Anesthetic complications: no     Last Vitals:  Vitals:   11/05/16 1440 11/05/16 1511  BP: (!) 141/80 126/86  Pulse: 93 88  Resp: 16   Temp: (!) 35.7 C   SpO2: 91% 95%    Last Pain:  Vitals:   11/05/16 1440  TempSrc: Temporal  PainSc:                  Cleda MccreedyJoseph K Caralynn Gelber

## 2016-11-07 LAB — SURGICAL PATHOLOGY

## 2017-06-08 ENCOUNTER — Other Ambulatory Visit: Payer: Self-pay

## 2017-06-08 ENCOUNTER — Emergency Department
Admission: EM | Admit: 2017-06-08 | Discharge: 2017-06-08 | Disposition: A | Discharge status: Home / Self Care | Payer: Medicare Other | Attending: Emergency Medicine | Admitting: Emergency Medicine

## 2017-06-08 ENCOUNTER — Encounter: Payer: Self-pay | Admitting: Emergency Medicine

## 2017-06-08 DIAGNOSIS — Z79899 Other long term (current) drug therapy: Secondary | ICD-10-CM | POA: Diagnosis not present

## 2017-06-08 DIAGNOSIS — Z96641 Presence of right artificial hip joint: Secondary | ICD-10-CM | POA: Diagnosis not present

## 2017-06-08 DIAGNOSIS — F17210 Nicotine dependence, cigarettes, uncomplicated: Secondary | ICD-10-CM | POA: Diagnosis not present

## 2017-06-08 DIAGNOSIS — R197 Diarrhea, unspecified: Secondary | ICD-10-CM

## 2017-06-08 DIAGNOSIS — R1012 Left upper quadrant pain: Secondary | ICD-10-CM | POA: Diagnosis present

## 2017-06-08 DIAGNOSIS — I10 Essential (primary) hypertension: Secondary | ICD-10-CM | POA: Diagnosis not present

## 2017-06-08 DIAGNOSIS — K2970 Gastritis, unspecified, without bleeding: Secondary | ICD-10-CM | POA: Insufficient documentation

## 2017-06-08 MED ORDER — GI COCKTAIL ~~LOC~~
30.0000 mL | Freq: Once | ORAL | Status: AC
  Administered 2017-06-08: 30 mL via ORAL
  Filled 2017-06-08: qty 30

## 2017-06-08 MED ORDER — ALUM & MAG HYDROXIDE-SIMETH 400-400-40 MG/5ML PO SUSP: 15.0000 mL | Freq: Four times a day (QID) | ORAL | 0 refills | Status: DC | PRN

## 2017-06-08 NOTE — ED Notes (Signed)
Patient stated she does not have a way home and is willing to pay for a cab to get home vs. Paying an ambulance bill for convenience.  [facility] and they do not have after hours staff available to pick patient up.  Patient states she is able to pay for the cab but would have to go inside to get her purse because she left it at home.

## 2017-06-08 NOTE — ED Notes (Signed)
Had extensive conversation with patient about her follow up care.  I explained to her if the maalox did not work for her that she should talk with her primary care and get and see if she needs a referral to a GI doctor since she said "it has been a long time since I was scoped and they found my ulcer".  She is comfortable with being taken home by [employer] cab company to her place at [facility].

## 2017-06-08 NOTE — ED Triage Notes (Signed)
Pt arrives via [facility] from [facility] for ulcer pain. Pt reports that she has a seasonal ulcer which has been hurting x 3 days. Pt reports that she just wants something for the ulcer pain. Pt denies N/V/D at this time.Pt is in NAD at this time.

## 2017-06-08 NOTE — ED Provider Notes (Signed)
[facility] Emergency Department Provider Note  ___________________________________________   First MD Initiated Contact with Patient 06/08/17 [phone]     (approximate)  I have reviewed the triage vital signs and the nursing notes.   HISTORY  Chief Complaint Gastroesophageal Reflux   HPI [name] is a 72 y.o. female with a history of gastritis as well as esophagitis that she says flares every spring was presented with burning left upper quadrant abdominal pain over the past 24 hours.  She says that she also had one episode of diarrhea where she lost incontinence and was not able to make it to the toilet.  She has been taking her Zantac as well as omeprazole but says that her burning left upper quadrant pain has worsened.  She says that the pain does not radiate into the chest.  She does not report any shortness of breath or vomiting.  Says that she had a very similar episode last year and needed to come to emergency department for pain relief.  Past Medical History:  Diagnosis Date  . ALCOHOL ABUSE, HX OF   . ANEMIA-NOS   . ANXIETY   . CERVICAL RADICULOPATHY, LEFT   . DEGENERATIVE DISC DISEASE, CERVICAL SPINE   . DEGENERATIVE DISC DISEASE, CERVICAL SPINE, W/RADICULOPATHY   . Depressive disorder, not elsewhere classified   . Depressive type psychosis (HCC)   . DIVERTICULITIS, HX OF   . DIZZINESS   . GERD   . HYPERLIPIDEMIA   . HYPERTENSION   . Insomnia, unspecified   . LOW BACK PAIN   . Memory loss   . MIGRAINE HEADACHE   . Palpitation   . WEIGHT LOSS     Patient Active Problem List   Diagnosis Date Noted  . Chest pain 11/29/2015  . Primary osteoarthritis of right hip 08/04/2015  . Preventative health care 01/15/2011  . WEIGHT LOSS 06/09/2009  . Depressive type psychosis (HCC) 10/12/2008  . CERVICAL RADICULOPATHY, LEFT 10/12/2008  . DIZZINESS 09/29/2008  . DEGENERATIVE DISC DISEASE, CERVICAL SPINE, W/RADICULOPATHY 07/21/2008  . Memory loss  01/09/2008  . Depressive disorder, not elsewhere classified 07/16/2007  . ANEMIA-NOS 01/14/2007  . HYPERTENSION 01/14/2007  . DEGENERATIVE DISC DISEASE, CERVICAL SPINE 01/14/2007  . LOW BACK PAIN 01/14/2007  . HYPERLIPIDEMIA 10/25/2006  . MIGRAINE HEADACHE 10/25/2006  . Insomnia, unspecified 10/25/2006  . ALCOHOL ABUSE, HX OF 10/25/2006  . DIVERTICULITIS, HX OF 10/25/2006  . ANXIETY 09/19/2006  . GERD 09/19/2006    Past Surgical History:  Procedure Laterality Date  . BACK SURGERY    . CERVICAL FUSION     X 2, [place], [place], [place]  . DILATION AND CURETTAGE OF UTERUS    . ESOPHAGOGASTRODUODENOSCOPY N/A 01/03/2015   Procedure: ESOPHAGOGASTRODUODENOSCOPY (EGD);  Surgeon: Elnita Maxwell, MD;  Location: [facility] ENDOSCOPY;  Service: Endoscopy;  Laterality: N/A;  . EYE SURGERY Bilateral    Cataract Extraction with IOL  . HYSTEROSCOPY W/D&C N/A 11/05/2016   Procedure: DILATATION AND CURETTAGE /HYSTEROSCOPY/MYOSURE RESECTION OF ENDOMETRIAL POLYP;  Surgeon: Schermerhorn, Ihor Austin, MD;  Location: [facility] ORS;  Service: Gynecology;  Laterality: N/A;  . LUMBAR FUSION    . TONSILLECTOMY    . TOTAL HIP ARTHROPLASTY Right 08/04/2015   Procedure: TOTAL HIP ARTHROPLASTY ANTERIOR APPROACH;  Surgeon: Kennedy Bucker, MD;  Location: [facility] ORS;  Service: Orthopedics;  Laterality: Right;    Prior to Admission medications   Medication Sig Start Date End Date Taking? Authorizing Provider  acetaminophen (TYLENOL) 500 MG tablet Take 500 mg  by mouth every 4 (four) hours as needed for mild pain, fever or headache.    [provider]  alum & mag hydroxide-simeth (MINTOX) 200-200-20 MG/5ML suspension Take 30 mLs by mouth daily as needed for indigestion or heartburn.    [provider]  buPROPion (WELLBUTRIN XL) 300 MG 24 hr tablet Take 300 mg by mouth daily.    [provider]  busPIRone (BUSPAR) 5 MG tablet Take 5 mg by mouth 2 (two) times daily.    [provider]    dicyclomine (BENTYL) 10 MG capsule Take 10 mg by mouth every 6 (six) hours.    [provider]  donepezil (ARICEPT) 10 MG tablet Take 10 mg by mouth at bedtime.     [provider]  guaifenesin (ROBITUSSIN) 100 MG/5ML syrup Take 10 mLs by mouth every 6 (six) hours as needed for cough. Not to exceed 4 doses in 24 hours    [provider]  Lidocaine (ASPERCREME LIDOCAINE) 4 % PTCH Apply 1 patch topically every 12 (twelve) hours. Then off for 12 hours    [provider]  loperamide (IMODIUM) 2 MG capsule Take 2 mg by mouth as needed for diarrhea or loose stools.    [provider]  LORazepam (ATIVAN) 0.5 MG tablet Take 0.5 mg by mouth at bedtime.    [provider]  magnesium hydroxide (MILK OF MAGNESIA) 400 MG/5ML suspension Take 30 mLs by mouth at bedtime as needed for mild constipation.    [provider]  Melatonin 5 MG TABS Take 5 mg by mouth at bedtime as needed.     [provider]  Menthol, Topical Analgesic, (BENGAY VANISHING SCENT) 2.5 % GEL Apply 1 application topically 4 (four) times daily as needed.    [provider]  Neomycin-Bacitracin-Polymyxin (TRIPLE ANTIBIOTIC) [phone] OINT Apply 1 application topically as needed. For minor skin tears or abrasion. Clean area with normal saline, apply ointment or equivalent, cover with bandaid or gauze and tape and change as needed until healed.    [provider]  omeprazole (PRILOSEC) 40 MG capsule Take 40 mg by mouth daily.    [provider]  pregabalin (LYRICA) 75 MG capsule Take 75 mg by mouth 2 (two) times daily.    [provider]  senna (SENOKOT) 8.6 MG TABS tablet Take 1 tablet by mouth daily.    [provider]  sertraline (ZOLOFT) 50 MG tablet Take 75 mg by mouth every evening.     [provider]  simvastatin (ZOCOR) 40 MG tablet Take 40 mg by mouth at bedtime.    [provider]  trolamine  salicylate (ARTHRICREAM) 10 % cream Apply 1 application topically 4 (four) times daily as needed for muscle pain.    [provider]  zolpidem (AMBIEN) 5 MG tablet Take 5 mg by mouth at bedtime.     [provider]    Allergies Aspirin; Atorvastatin; Codeine; and Morphine  Family History  Problem Relation Age of Onset  . Alcohol abuse Father   . Depression Other   . Alcohol abuse Other     Social History Social History   Tobacco Use  . Smoking status: Current Every Day Smoker    Packs/day: 0.50    Types: Cigarettes  . Smokeless tobacco: Never Used  Substance Use Topics  . Alcohol use: No  . Drug use: No    Review of Systems  Constitutional: No fever/chills Eyes: No visual changes. ENT: No sore throat.  Cardiovascular: Denies chest pain. Respiratory: Denies shortness of breath. Gastrointestinal: No nausea, no vomiting.  No diarrhea.  No constipation. Genitourinary: Negative for dysuria. Musculoskeletal: Negative for back pain. Skin: Negative for rash. Neurological: Negative for headaches, focal weakness or numbness.   ____________________________________________   PHYSICAL EXAM:  VITAL SIGNS: ED Triage Vitals  Enc Vitals Group     BP 06/08/17 0029 135/85     Pulse Rate 06/08/17 0029 87     Resp 06/08/17 0029 18     Temp 06/08/17 0029 97.8 F (36.6 C)     Temp Source 06/08/17 0029 Oral     SpO2 06/08/17 0029 98 %     Weight 06/08/17 0025 210 lb (95.3 kg)     Height 06/08/17 0025 5\' 6"  (1.676 m)     Head Circumference --      Peak Flow --      Pain Score 06/08/17 0024 6     Pain Loc --      Pain Edu? --      Excl. in GC? --     Constitutional: Alert and oriented. Well appearing and in no acute distress. Eyes: Conjunctivae are normal.  Head: Atraumatic. Nose: No congestion/rhinnorhea. Mouth/Throat: Mucous membranes are moist.  Neck: No stridor.   Cardiovascular: Normal rate, regular rhythm. Grossly normal heart sounds.    Respiratory: Normal respiratory effort.  No retractions. Lungs CTAB. Gastrointestinal: Soft and nontender. No distention. Musculoskeletal: No lower extremity tenderness nor edema.  No joint effusions. Neurologic:  Normal speech and language. No gross focal neurologic deficits are appreciated. Skin:  Skin is warm, dry and intact. No rash noted. Psychiatric: Mood and affect are normal. Speech and behavior are normal.  ____________________________________________   LABS (all labs ordered are listed, but only abnormal results are displayed)  Labs Reviewed - No data to display ____________________________________________  EKG   ____________________________________________  RADIOLOGY   ____________________________________________   PROCEDURES  Procedure(s) performed:   Procedures  Critical Care performed:   ____________________________________________   INITIAL IMPRESSION / ASSESSMENT AND PLAN / ED COURSE  Pertinent labs & imaging results that were available during my care of the patient were reviewed by me and considered in my medical decision making (see chart for details).  Differential diagnosis includes, but is not limited to, biliary disease (biliary colic, acute cholecystitis, cholangitis, choledocholithiasis, etc), intrathoracic causes for epigastric abdominal pain including ACS, gastritis, duodenitis, pancreatitis, small bowel or large bowel obstruction, abdominal aortic aneurysm, hernia, and gastritis. As part of my medical decision making, I reviewed the following data within the electronic MEDICAL RECORD NUMBER From previous outpt visits  ----------------------------------------- 1:44 AM on 06/08/2017 -----------------------------------------  Patient at this time with pain relieved status post GI cocktail.  Pain is consistent with past gastritis.  Patient will take Maalox as needed in addition to her already p.o. medications for reflux and gastritis/esophagitis.   She is understanding of the treatment plan willing to comply but will be discharged at this time.  One episode of diarrhea.  Unlikely to be pathologic such as C. difficile.  Nontender to palpation of the abdomen. ____________________________________________   FINAL CLINICAL IMPRESSION(S) / ED DIAGNOSES  Gastritis.  Diarrhea.    NEW MEDICATIONS STARTED DURING THIS VISIT:  New Prescriptions   No medications on file     Note:  This document was prepared using Dragon voice recognition software and may include unintentional dictation errors.     Myrna BlazerSchaevitz, David Matthew, MD 06/08/17 [phone]

## 2017-08-01 ENCOUNTER — Other Ambulatory Visit: Payer: Self-pay | Admitting: Orthopedic Surgery

## 2017-08-01 DIAGNOSIS — M47818 Spondylosis without myelopathy or radiculopathy, sacral and sacrococcygeal region: Secondary | ICD-10-CM

## 2017-08-14 ENCOUNTER — Ambulatory Visit
Admission: RE | Admit: 2017-08-14 | Discharge: 2017-08-14 | Disposition: A | Discharge status: Home / Self Care | Payer: Medicare Other | Source: Ambulatory Visit | Attending: Orthopedic Surgery | Admitting: Orthopedic Surgery

## 2017-08-14 DIAGNOSIS — M1612 Unilateral primary osteoarthritis, left hip: Secondary | ICD-10-CM | POA: Insufficient documentation

## 2017-08-14 DIAGNOSIS — M47818 Spondylosis without myelopathy or radiculopathy, sacral and sacrococcygeal region: Secondary | ICD-10-CM | POA: Insufficient documentation

## 2017-08-14 DIAGNOSIS — Z9889 Other specified postprocedural states: Secondary | ICD-10-CM | POA: Diagnosis not present

## 2017-09-06 ENCOUNTER — Other Ambulatory Visit: Payer: Self-pay

## 2017-09-06 ENCOUNTER — Encounter
Admission: RE | Admit: 2017-09-06 | Discharge: 2017-09-06 | Disposition: A | Discharge status: Home / Self Care | Payer: Medicare Other | Source: Ambulatory Visit | Attending: Orthopedic Surgery | Admitting: Orthopedic Surgery

## 2017-09-06 DIAGNOSIS — Z01812 Encounter for preprocedural laboratory examination: Secondary | ICD-10-CM | POA: Diagnosis present

## 2017-09-06 DIAGNOSIS — R9431 Abnormal electrocardiogram [ECG] [EKG]: Secondary | ICD-10-CM | POA: Insufficient documentation

## 2017-09-06 DIAGNOSIS — Z01810 Encounter for preprocedural cardiovascular examination: Secondary | ICD-10-CM | POA: Diagnosis not present

## 2017-09-06 DIAGNOSIS — G43909 Migraine, unspecified, not intractable, without status migrainosus: Secondary | ICD-10-CM

## 2017-09-06 HISTORY — DX: Migraine, unspecified, not intractable, without status migrainosus: G43.909

## 2017-09-06 LAB — PROTIME-INR
INR: 1.01
PROTHROMBIN TIME: 13.2 s (ref 11.4–15.2)

## 2017-09-06 LAB — CBC WITH DIFFERENTIAL/PLATELET
BASOS PCT: 1 %
Basophils Absolute: 0.1 10*3/uL (ref 0–0.1)
EOS ABS: 0.1 10*3/uL (ref 0–0.7)
EOS PCT: 1 %
HCT: 42.4 % (ref 35.0–47.0)
Hemoglobin: 14.4 g/dL (ref 12.0–16.0)
LYMPHS ABS: 3.3 10*3/uL (ref 1.0–3.6)
Lymphocytes Relative: 31 %
MCH: 33.2 pg (ref 26.0–34.0)
MCHC: 33.8 g/dL (ref 32.0–36.0)
MCV: 98.1 fL (ref 80.0–100.0)
MONOS PCT: 4 %
Monocytes Absolute: 0.4 10*3/uL (ref 0.2–0.9)
Neutro Abs: 6.9 10*3/uL — ABNORMAL HIGH (ref 1.4–6.5)
Neutrophils Relative %: 63 %
PLATELETS: 249 10*3/uL (ref 150–440)
RBC: 4.33 MIL/uL (ref 3.80–5.20)
RDW: 14.7 % — ABNORMAL HIGH (ref 11.5–14.5)
WBC: 10.9 10*3/uL (ref 3.6–11.0)

## 2017-09-06 LAB — SURGICAL PCR SCREEN
MRSA, PCR: NEGATIVE
STAPHYLOCOCCUS AUREUS: NEGATIVE

## 2017-09-06 LAB — BASIC METABOLIC PANEL
Anion gap: 10 (ref 5–15)
BUN: 13 mg/dL (ref 8–23)
CALCIUM: 9.7 mg/dL (ref 8.9–10.3)
CO2: 20 mmol/L — ABNORMAL LOW (ref 22–32)
CREATININE: 0.77 mg/dL (ref 0.44–1.00)
Chloride: 109 mmol/L (ref 98–111)
Glucose, Bld: 80 mg/dL (ref 70–99)
Potassium: 4.1 mmol/L (ref 3.5–5.1)
SODIUM: 139 mmol/L (ref 135–145)

## 2017-09-06 LAB — URINALYSIS, ROUTINE W REFLEX MICROSCOPIC
Bilirubin Urine: NEGATIVE
Glucose, UA: NEGATIVE mg/dL
Hgb urine dipstick: NEGATIVE
KETONES UR: NEGATIVE mg/dL
Leukocytes, UA: NEGATIVE
NITRITE: NEGATIVE
PH: 5 (ref 5.0–8.0)
Protein, ur: NEGATIVE mg/dL
SPECIFIC GRAVITY, URINE: 1.029 (ref 1.005–1.030)

## 2017-09-06 LAB — SEDIMENTATION RATE: SED RATE: 23 mm/h (ref 0–30)

## 2017-09-06 LAB — TYPE AND SCREEN
ABO/RH(D): B POS
Antibody Screen: NEGATIVE

## 2017-09-06 LAB — APTT: aPTT: 42 seconds — ABNORMAL HIGH (ref 24–36)

## 2017-09-06 NOTE — Patient Instructions (Signed)
Your procedure is scheduled on: Tuesday, September 10, 2017  Report to THE SECOND FLOOR OF THE MEDICAL MALL    DO NOT STOP ON THE FIRST FLOOR TO REGISTER  To find out your arrival time please call [phone] between 1PM - 3PM on Monday, September 09, 2017  Remember: Instructions that are not followed completely may result in serious medical risk,  up to and including death, or upon the discretion of your surgeon and anesthesiologist your  surgery may need to be rescheduled.     _X__ 1. Do not eat food after midnight the night before your procedure.                 No gum chewing or hard candies. NO LOZENGERS, TIC TACS, TUMS, NOTHING           ABSOLUTELY NOTHING SOLID IN YOUR MOUTH AFTER MIDNIGHT                  You may drink clear liquids up to 2 hours Before you are scheduled to arrive for your surgery-                   DO not drink clear liquids within 2 hours of the start of your surgery.                  Clear Liquids include:  water, apple juice without pulp, clear carbohydrate                 drink such as Clearfast of Gatorade, Black Coffee or Tea (Do not add                 anything to coffee or tea).  __X__2.  On the morning of surgery brush your teeth with toothpaste and water,                   [facility] may rinse your mouth with mouthwash if you wish.                      Do not swallow any toothpaste of mouthwash.     _X__ 3.  No Alcohol for 24 hours before or after surgery.   _X__ 4.  Do Not Smoke or use e-cigarettes For 24 Hours Prior to Your Surgery.                 Do not use any chewable tobacco products for at least 6 hours prior to                 surgery.  ____  5.  Bring all medications with you on the day of surgery if instructed.   ____  6.  Notify your doctor if there is any change in your medical condition      (cold, fever, infections).     Do not wear jewelry, make-up, hairpins, clips or nail polish. Do not wear lotions, powders, or  perfumes. You may wear deodorant. Do not shave 48 hours prior to surgery. Men may shave face and neck. Do not bring valuables to the hospital.    [facility] is not responsible for any belongings or valuables.  Contacts, dentures or bridgework may not be worn into surgery. Leave your suitcase in the car. After surgery it may be brought to your room. For patients admitted to the hospital, discharge time is determined by your treatment team.   Patients discharged the day of surgery will  not be allowed to drive home.   Please read over the following fact sheets that you were given:                                           MRSA: STOP THE SPREAD                        PREPARING FOR SURGERY   _X___ Take these medicines the morning of surgery with A SIP OF WATER:               (I AM ASSUMING THAT THE FOLLOWING MEDICINES ARE TAKEN IN THE MORNING)   1. WELLBUTRIIN  2.  BUSPAR  3.  PRILOSEC  4. LYRICA  5. ZANTAC  6. NORCO, IF NEEDED  ____ Fleet Enema (as directed)   _X___ Use CHG Soap as directed  __X__ Stop ANY AND ALL ASPIRIN PRODUCTS AS OF TODAY  _X___ Stop Anti-inflammatories AS OF TODAY                THIS INCLUDES IBUPROFEN / VOLTAREN GEL / ARTHRICREAM   _X___ Stop supplements until after surgery.                THIS INCLUDES MINTOX(MAALOX) / VITAMIN C / ZINC  ____ Bring C-Pap to the hospital.   YOU MAY CONTINUE TO TAKE THE FOLLOWING MEDICINES BUT NOT ON THE DAY OF SURGERY:          BENTYL / LISINOPRIL / SENNA / ARTHRICREAM   CONTINUE TO TAKE THE FOLLOWING MEDICINES AT NIGHT TIME:         ARICEPT / LUNESTA / ZOLOFT AND ZOCOR  TYLENOL IS ACCEPTABLE TO TAKE AT ANY TIME UP TO SURGERY.  WEAR/BRING STURDY SHOES OR SLIPPERS. MAKE SURE THEY WILL NOT SLIP  PLEASE FIND OUT THE NAME OF THE PHARMACY THAT [facility] USES.       CALL [phone] ONCE YOU FIND OUT.

## 2017-09-06 NOTE — Pre-Procedure Instructions (Signed)
Patient unaware of her medications as they are provided to her by the nurse at [facility]. Will call to verify what she takes.  Also, patient would like to return there for rehab if possible as she states that they have physical therapy providers that would be happy to help her.I have not confirmed that information.

## 2017-09-07 LAB — URINE CULTURE

## 2017-09-07 NOTE — Pre-Procedure Instructions (Signed)
PTT results sent to Dr. Rosita KeaMenz for review.

## 2017-09-09 MED ORDER — CEFAZOLIN SODIUM-DEXTROSE 2-4 GM/100ML-% IV SOLN
2.0000 g | Freq: Once | INTRAVENOUS | Status: AC
  Administered 2017-09-10: 2 g via INTRAVENOUS

## 2017-09-10 ENCOUNTER — Inpatient Hospital Stay: Payer: Medicare Other | Admitting: Anesthesiology

## 2017-09-10 ENCOUNTER — Other Ambulatory Visit: Payer: Self-pay

## 2017-09-10 ENCOUNTER — Inpatient Hospital Stay: Payer: Medicare Other

## 2017-09-10 ENCOUNTER — Inpatient Hospital Stay
Admission: RE | Admit: 2017-09-10 | Discharge: 2017-09-13 | DRG: 470 | Disposition: A | Discharge status: Skilled Nursing Facility | Payer: Medicare Other | Source: Ambulatory Visit | Attending: Orthopedic Surgery | Admitting: Orthopedic Surgery

## 2017-09-10 ENCOUNTER — Encounter
Admission: RE | Disposition: A | Discharge status: Skilled Nursing Facility | Payer: Self-pay | Source: Ambulatory Visit | Attending: Orthopedic Surgery

## 2017-09-10 DIAGNOSIS — Z886 Allergy status to analgesic agent status: Secondary | ICD-10-CM

## 2017-09-10 DIAGNOSIS — Z888 Allergy status to other drugs, medicaments and biological substances status: Secondary | ICD-10-CM

## 2017-09-10 DIAGNOSIS — I10 Essential (primary) hypertension: Secondary | ICD-10-CM | POA: Diagnosis present

## 2017-09-10 DIAGNOSIS — Z7951 Long term (current) use of inhaled steroids: Secondary | ICD-10-CM

## 2017-09-10 DIAGNOSIS — Z885 Allergy status to narcotic agent status: Secondary | ICD-10-CM

## 2017-09-10 DIAGNOSIS — M1612 Unilateral primary osteoarthritis, left hip: Secondary | ICD-10-CM | POA: Diagnosis present

## 2017-09-10 DIAGNOSIS — K219 Gastro-esophageal reflux disease without esophagitis: Secondary | ICD-10-CM | POA: Diagnosis present

## 2017-09-10 DIAGNOSIS — F419 Anxiety disorder, unspecified: Secondary | ICD-10-CM | POA: Diagnosis present

## 2017-09-10 DIAGNOSIS — Z96642 Presence of left artificial hip joint: Secondary | ICD-10-CM

## 2017-09-10 DIAGNOSIS — Z419 Encounter for procedure for purposes other than remedying health state, unspecified: Secondary | ICD-10-CM

## 2017-09-10 DIAGNOSIS — G8918 Other acute postprocedural pain: Secondary | ICD-10-CM

## 2017-09-10 DIAGNOSIS — Z7982 Long term (current) use of aspirin: Secondary | ICD-10-CM

## 2017-09-10 DIAGNOSIS — E785 Hyperlipidemia, unspecified: Secondary | ICD-10-CM | POA: Diagnosis present

## 2017-09-10 DIAGNOSIS — F329 Major depressive disorder, single episode, unspecified: Secondary | ICD-10-CM | POA: Diagnosis present

## 2017-09-10 DIAGNOSIS — Z79899 Other long term (current) drug therapy: Secondary | ICD-10-CM

## 2017-09-10 HISTORY — PX: TOTAL HIP ARTHROPLASTY: SHX124

## 2017-09-10 SURGERY — ARTHROPLASTY, HIP, TOTAL, ANTERIOR APPROACH
Anesthesia: Spinal | Site: Hip | Laterality: Left | Wound class: Clean

## 2017-09-10 MED ORDER — ACETAMINOPHEN 325 MG PO TABS: 325.0000 mg | ORAL_TABLET | Freq: Four times a day (QID) | ORAL | Status: DC | PRN

## 2017-09-10 MED ORDER — DEXAMETHASONE SODIUM PHOSPHATE 10 MG/ML IJ SOLN
INTRAMUSCULAR | Status: DC | PRN
  Administered 2017-09-10: 5 mg via INTRAVENOUS

## 2017-09-10 MED ORDER — PROPOFOL 500 MG/50ML IV EMUL
INTRAVENOUS | Status: DC | PRN
  Administered 2017-09-10: 75 ug/kg/min via INTRAVENOUS

## 2017-09-10 MED ORDER — PROPOFOL 500 MG/50ML IV EMUL
INTRAVENOUS | Status: AC
  Filled 2017-09-10: qty 50

## 2017-09-10 MED ORDER — BUPROPION HCL ER (XL) 150 MG PO TB24
300.0000 mg | ORAL_TABLET | Freq: Every day | ORAL | Status: DC
  Administered 2017-09-10 – 2017-09-13 (×4): 300 mg via ORAL
  Filled 2017-09-10 (×4): qty 2

## 2017-09-10 MED ORDER — HYDROMORPHONE HCL 1 MG/ML IJ SOLN
0.5000 mg | INTRAMUSCULAR | Status: DC | PRN
  Administered 2017-09-10 (×2): 0.5 mg via INTRAVENOUS
  Filled 2017-09-10 (×2): qty 1

## 2017-09-10 MED ORDER — DOCUSATE SODIUM 100 MG PO CAPS
100.0000 mg | ORAL_CAPSULE | Freq: Two times a day (BID) | ORAL | Status: DC
  Administered 2017-09-10 – 2017-09-13 (×6): 100 mg via ORAL
  Filled 2017-09-10 (×6): qty 1

## 2017-09-10 MED ORDER — BUPIVACAINE HCL (PF) 0.5 % IJ SOLN
INTRAMUSCULAR | Status: DC | PRN
  Administered 2017-09-10: 3 mL via INTRATHECAL

## 2017-09-10 MED ORDER — LOPERAMIDE HCL 2 MG PO CAPS
2.0000 mg | ORAL_CAPSULE | ORAL | Status: DC | PRN
  Filled 2017-09-10: qty 1

## 2017-09-10 MED ORDER — TRAMADOL HCL 50 MG PO TABS
50.0000 mg | ORAL_TABLET | Freq: Four times a day (QID) | ORAL | Status: DC
  Administered 2017-09-10 – 2017-09-13 (×13): 50 mg via ORAL
  Filled 2017-09-10 (×14): qty 1

## 2017-09-10 MED ORDER — DONEPEZIL HCL 5 MG PO TABS
10.0000 mg | ORAL_TABLET | Freq: Every day | ORAL | Status: DC
  Administered 2017-09-10 – 2017-09-12 (×3): 10 mg via ORAL
  Filled 2017-09-10 (×4): qty 2

## 2017-09-10 MED ORDER — SIMVASTATIN 20 MG PO TABS
40.0000 mg | ORAL_TABLET | Freq: Every day | ORAL | Status: DC
  Administered 2017-09-10 – 2017-09-12 (×3): 40 mg via ORAL
  Filled 2017-09-10 (×3): qty 2

## 2017-09-10 MED ORDER — MIDAZOLAM HCL 2 MG/2ML IJ SOLN
INTRAMUSCULAR | Status: DC | PRN
  Administered 2017-09-10 (×2): 1 mg via INTRAVENOUS

## 2017-09-10 MED ORDER — LISINOPRIL 20 MG PO TABS
20.0000 mg | ORAL_TABLET | Freq: Every day | ORAL | Status: DC
  Administered 2017-09-13: 20 mg via ORAL
  Filled 2017-09-10 (×3): qty 1

## 2017-09-10 MED ORDER — MAGNESIUM CITRATE PO SOLN
1.0000 | Freq: Once | ORAL | Status: DC | PRN
  Filled 2017-09-10: qty 296

## 2017-09-10 MED ORDER — ZOLPIDEM TARTRATE 5 MG PO TABS
5.0000 mg | ORAL_TABLET | Freq: Every evening | ORAL | Status: DC | PRN
  Administered 2017-09-11 – 2017-09-12 (×3): 5 mg via ORAL
  Filled 2017-09-10 (×3): qty 1

## 2017-09-10 MED ORDER — SODIUM CHLORIDE 0.9 % IV SOLN
INTRAVENOUS | Status: DC
  Administered 2017-09-10 – 2017-09-11 (×2): via INTRAVENOUS

## 2017-09-10 MED ORDER — SERTRALINE HCL 50 MG PO TABS
75.0000 mg | ORAL_TABLET | Freq: Every evening | ORAL | Status: DC
  Administered 2017-09-10 – 2017-09-13 (×4): 75 mg via ORAL
  Filled 2017-09-10 (×5): qty 2

## 2017-09-10 MED ORDER — PHENOL 1.4 % MT LIQD
1.0000 | OROMUCOSAL | Status: DC | PRN
  Filled 2017-09-10: qty 177

## 2017-09-10 MED ORDER — CEFAZOLIN SODIUM-DEXTROSE 2-4 GM/100ML-% IV SOLN
2.0000 g | Freq: Four times a day (QID) | INTRAVENOUS | Status: AC
  Administered 2017-09-10 – 2017-09-11 (×3): 2 g via INTRAVENOUS
  Filled 2017-09-10 (×3): qty 100

## 2017-09-10 MED ORDER — MIDAZOLAM HCL 2 MG/2ML IJ SOLN
INTRAMUSCULAR | Status: AC
  Filled 2017-09-10: qty 2

## 2017-09-10 MED ORDER — ONDANSETRON HCL 4 MG PO TABS: 4.0000 mg | ORAL_TABLET | Freq: Four times a day (QID) | ORAL | Status: DC | PRN

## 2017-09-10 MED ORDER — ALBUTEROL SULFATE HFA 108 (90 BASE) MCG/ACT IN AERS: 2.0000 | INHALATION_SPRAY | Freq: Four times a day (QID) | RESPIRATORY_TRACT | Status: DC | PRN

## 2017-09-10 MED ORDER — DIPHENHYDRAMINE HCL 12.5 MG/5ML PO ELIX: 12.5000 mg | ORAL_SOLUTION | ORAL | Status: DC | PRN

## 2017-09-10 MED ORDER — ONDANSETRON HCL 4 MG/2ML IJ SOLN
INTRAMUSCULAR | Status: DC | PRN
  Administered 2017-09-10: 4 mg via INTRAVENOUS

## 2017-09-10 MED ORDER — ALBUTEROL SULFATE (2.5 MG/3ML) 0.083% IN NEBU: 2.5000 mg | INHALATION_SOLUTION | Freq: Four times a day (QID) | RESPIRATORY_TRACT | Status: DC | PRN

## 2017-09-10 MED ORDER — PREGABALIN 75 MG PO CAPS
75.0000 mg | ORAL_CAPSULE | Freq: Two times a day (BID) | ORAL | Status: DC
  Administered 2017-09-10 – 2017-09-13 (×6): 75 mg via ORAL
  Filled 2017-09-10 (×6): qty 1

## 2017-09-10 MED ORDER — ONDANSETRON HCL 4 MG/2ML IJ SOLN: 4.0000 mg | Freq: Four times a day (QID) | INTRAMUSCULAR | Status: DC | PRN

## 2017-09-10 MED ORDER — BUPIVACAINE HCL (PF) 0.5 % IJ SOLN
INTRAMUSCULAR | Status: AC
  Filled 2017-09-10: qty 10

## 2017-09-10 MED ORDER — SENNOSIDES-DOCUSATE SODIUM 8.6-50 MG PO TABS: 1.0000 | ORAL_TABLET | Freq: Every evening | ORAL | Status: DC | PRN

## 2017-09-10 MED ORDER — BUSPIRONE HCL 10 MG PO TABS
5.0000 mg | ORAL_TABLET | Freq: Two times a day (BID) | ORAL | Status: DC
  Administered 2017-09-10 – 2017-09-13 (×6): 5 mg via ORAL
  Filled 2017-09-10 (×6): qty 1

## 2017-09-10 MED ORDER — CEFAZOLIN SODIUM-DEXTROSE 2-4 GM/100ML-% IV SOLN
INTRAVENOUS | Status: AC
  Filled 2017-09-10: qty 100

## 2017-09-10 MED ORDER — BENZOCAINE-MENTHOL 15-2.6 MG MT LOZG: 1.0000 | LOZENGE | OROMUCOSAL | Status: DC | PRN

## 2017-09-10 MED ORDER — MENTHOL 3 MG MT LOZG
1.0000 | LOZENGE | OROMUCOSAL | Status: DC | PRN
  Filled 2017-09-10: qty 9

## 2017-09-10 MED ORDER — BUPIVACAINE-EPINEPHRINE 0.25% -1:200000 IJ SOLN
INTRAMUSCULAR | Status: DC | PRN
  Administered 2017-09-10: 30 mL

## 2017-09-10 MED ORDER — SODIUM CHLORIDE 0.9 % IV SOLN
INTRAVENOUS | Status: DC | PRN
  Administered 2017-09-10: 25 ug/min via INTRAVENOUS

## 2017-09-10 MED ORDER — NEOMYCIN-POLYMYXIN B GU 40-200000 IR SOLN
Status: DC | PRN
  Administered 2017-09-10: 4 mL

## 2017-09-10 MED ORDER — PHENYLEPHRINE HCL 10 MG/ML IJ SOLN
INTRAMUSCULAR | Status: DC | PRN
  Administered 2017-09-10 (×3): 200 ug via INTRAVENOUS

## 2017-09-10 MED ORDER — ONDANSETRON HCL 4 MG/2ML IJ SOLN: 4.0000 mg | Freq: Once | INTRAMUSCULAR | Status: DC | PRN

## 2017-09-10 MED ORDER — ZINC SULFATE 220 (50 ZN) MG PO CAPS
220.0000 mg | ORAL_CAPSULE | Freq: Every day | ORAL | Status: DC
  Administered 2017-09-10 – 2017-09-13 (×4): 220 mg via ORAL
  Filled 2017-09-10 (×4): qty 1

## 2017-09-10 MED ORDER — ACETAMINOPHEN 500 MG PO TABS
500.0000 mg | ORAL_TABLET | Freq: Four times a day (QID) | ORAL | Status: AC
  Administered 2017-09-10 – 2017-09-11 (×3): 500 mg via ORAL
  Filled 2017-09-10 (×5): qty 1

## 2017-09-10 MED ORDER — DEXAMETHASONE SODIUM PHOSPHATE 10 MG/ML IJ SOLN
INTRAMUSCULAR | Status: AC
  Filled 2017-09-10: qty 1

## 2017-09-10 MED ORDER — HYDROCODONE-ACETAMINOPHEN 7.5-325 MG PO TABS
1.0000 | ORAL_TABLET | ORAL | Status: DC | PRN
  Administered 2017-09-10 – 2017-09-11 (×2): 1 via ORAL
  Filled 2017-09-10 (×3): qty 1

## 2017-09-10 MED ORDER — METOCLOPRAMIDE HCL 5 MG/ML IJ SOLN: 5.0000 mg | Freq: Three times a day (TID) | INTRAMUSCULAR | Status: DC | PRN

## 2017-09-10 MED ORDER — MELATONIN 5 MG PO TABS
5.0000 mg | ORAL_TABLET | Freq: Every day | ORAL | Status: DC
  Administered 2017-09-10 – 2017-09-12 (×2): 5 mg via ORAL
  Filled 2017-09-10 (×4): qty 1

## 2017-09-10 MED ORDER — PROPOFOL 10 MG/ML IV BOLUS
INTRAVENOUS | Status: DC | PRN
  Administered 2017-09-10: 50 mg via INTRAVENOUS

## 2017-09-10 MED ORDER — TRANEXAMIC ACID 1000 MG/10ML IV SOLN
1000.0000 mg | INTRAVENOUS | Status: AC
  Administered 2017-09-10: 1000 mg via INTRAVENOUS
  Filled 2017-09-10: qty 1100

## 2017-09-10 MED ORDER — BISACODYL 5 MG PO TBEC
5.0000 mg | DELAYED_RELEASE_TABLET | Freq: Every day | ORAL | Status: DC | PRN
  Administered 2017-09-13: 5 mg via ORAL
  Filled 2017-09-10: qty 1

## 2017-09-10 MED ORDER — VITAMIN C 500 MG PO TABS
1000.0000 mg | ORAL_TABLET | Freq: Every day | ORAL | Status: DC
  Administered 2017-09-10 – 2017-09-13 (×4): 1000 mg via ORAL
  Filled 2017-09-10 (×4): qty 2

## 2017-09-10 MED ORDER — ALUM & MAG HYDROXIDE-SIMETH 200-200-20 MG/5ML PO SUSP: 30.0000 mL | Freq: Four times a day (QID) | ORAL | Status: DC | PRN

## 2017-09-10 MED ORDER — PANTOPRAZOLE SODIUM 40 MG PO TBEC
40.0000 mg | DELAYED_RELEASE_TABLET | Freq: Every day | ORAL | Status: DC
  Administered 2017-09-10 – 2017-09-13 (×4): 40 mg via ORAL
  Filled 2017-09-10 (×4): qty 1

## 2017-09-10 MED ORDER — FENTANYL CITRATE (PF) 100 MCG/2ML IJ SOLN: 25.0000 ug | INTRAMUSCULAR | Status: DC | PRN

## 2017-09-10 MED ORDER — HYDROCODONE-ACETAMINOPHEN 5-325 MG PO TABS
1.0000 | ORAL_TABLET | ORAL | Status: DC | PRN
  Administered 2017-09-10: 2 via ORAL
  Administered 2017-09-11: 1 via ORAL
  Administered 2017-09-11: 2 via ORAL
  Administered 2017-09-11 – 2017-09-13 (×7): 1 via ORAL
  Administered 2017-09-13: 2 via ORAL
  Administered 2017-09-13 (×2): 1 via ORAL
  Filled 2017-09-10: qty 2
  Filled 2017-09-10 (×2): qty 1
  Filled 2017-09-10 (×2): qty 2
  Filled 2017-09-10 (×3): qty 1
  Filled 2017-09-10: qty 2
  Filled 2017-09-10 (×2): qty 1
  Filled 2017-09-10: qty 2
  Filled 2017-09-10: qty 1

## 2017-09-10 MED ORDER — ASPIRIN EC 325 MG PO TBEC
325.0000 mg | DELAYED_RELEASE_TABLET | Freq: Every day | ORAL | Status: DC
  Administered 2017-09-11 – 2017-09-13 (×3): 325 mg via ORAL
  Filled 2017-09-10 (×3): qty 1

## 2017-09-10 MED ORDER — KETAMINE HCL 50 MG/ML IJ SOLN
INTRAMUSCULAR | Status: DC | PRN
  Administered 2017-09-10: 30 mg via INTRAMUSCULAR

## 2017-09-10 MED ORDER — DICYCLOMINE HCL 20 MG PO TABS
10.0000 mg | ORAL_TABLET | Freq: Two times a day (BID) | ORAL | Status: DC
  Administered 2017-09-11 – 2017-09-13 (×4): 10 mg via ORAL
  Filled 2017-09-10 (×6): qty 1

## 2017-09-10 MED ORDER — LACTATED RINGERS IV SOLN
INTRAVENOUS | Status: DC
Start: 2017-09-10 — End: 2017-09-10
  Administered 2017-09-10: 09:00:00 via INTRAVENOUS

## 2017-09-10 MED ORDER — DICYCLOMINE HCL 20 MG PO TABS
20.0000 mg | ORAL_TABLET | Freq: Every day | ORAL | Status: DC
  Administered 2017-09-10 – 2017-09-12 (×3): 20 mg via ORAL
  Filled 2017-09-10 (×4): qty 1

## 2017-09-10 MED ORDER — METOCLOPRAMIDE HCL 10 MG PO TABS: 5.0000 mg | ORAL_TABLET | Freq: Three times a day (TID) | ORAL | Status: DC | PRN

## 2017-09-10 MED ORDER — ONDANSETRON HCL 4 MG/2ML IJ SOLN
INTRAMUSCULAR | Status: AC
  Filled 2017-09-10: qty 2

## 2017-09-10 SURGICAL SUPPLY — 57 items
BLADE SAGITTAL AGGR TOOTH XLG (BLADE) ×2 IMPLANT
BNDG COHESIVE 6X5 TAN STRL LF (GAUZE/BANDAGES/DRESSINGS) ×6 IMPLANT
CANISTER SUCT 1200ML W/VALVE (MISCELLANEOUS) ×2 IMPLANT
CHLORAPREP W/TINT 26ML (MISCELLANEOUS) ×2 IMPLANT
DRAPE C-ARM XRAY 36X54 (DRAPES) ×2 IMPLANT
DRAPE INCISE IOBAN 66X60 STRL (DRAPES) IMPLANT
DRAPE POUCH INSTRU U-SHP 10X18 (DRAPES) ×2 IMPLANT
DRAPE SHEET LG 3/4 BI-LAMINATE (DRAPES) ×6 IMPLANT
DRAPE TABLE BACK 80X90 (DRAPES) ×2 IMPLANT
DRESSING SURGICEL FIBRLLR 1X2 (HEMOSTASIS) ×2 IMPLANT
DRSG OPSITE POSTOP 4X8 (GAUZE/BANDAGES/DRESSINGS) ×4 IMPLANT
DRSG SURGICEL FIBRILLAR 1X2 (HEMOSTASIS) ×4
ELECT BLADE 6.5 EXT (BLADE) ×2 IMPLANT
ELECT REM PT RETURN 9FT ADLT (ELECTROSURGICAL) ×2
ELECTRODE REM PT RTRN 9FT ADLT (ELECTROSURGICAL) ×1 IMPLANT
GLOVE BIOGEL PI IND STRL 9 (GLOVE) ×1 IMPLANT
GLOVE BIOGEL PI INDICATOR 9 (GLOVE) ×1
GLOVE SURG SYN 9.0  PF PI (GLOVE) ×2
GLOVE SURG SYN 9.0 PF PI (GLOVE) ×2 IMPLANT
GOWN SRG 2XL LVL 4 RGLN SLV (GOWNS) ×1 IMPLANT
GOWN STRL NON-REIN 2XL LVL4 (GOWNS) ×2
GOWN STRL REUS W/ TWL LRG LVL3 (GOWN DISPOSABLE) ×1 IMPLANT
GOWN STRL REUS W/TWL LRG LVL3 (GOWN DISPOSABLE) ×2
HEAD FEM S 22.2MM -2.5 0125124 (Head) ×1 IMPLANT
HEMOVAC 400CC 10FR (MISCELLANEOUS) IMPLANT
HOLDER FOLEY CATH W/STRAP (MISCELLANEOUS) ×2 IMPLANT
HOOD PEEL AWAY FLYTE STAYCOOL (MISCELLANEOUS) ×2 IMPLANT
KIT PREVENA INCISION MGT 13 (CANNISTER) ×2 IMPLANT
LINER DMC HEAD 0 22.2MM 012622 (Liner) ×1 IMPLANT
MAT BLUE FLOOR 46X72 FLO (MISCELLANEOUS) ×2 IMPLANT
NDL SAFETY ECLIPSE 18X1.5 (NEEDLE) ×1 IMPLANT
NDL SPNL 18GX3.5 QUINCKE PK (NEEDLE) ×1 IMPLANT
NEEDLE HYPO 18GX1.5 SHARP (NEEDLE) ×2
NEEDLE SPNL 18GX3.5 QUINCKE PK (NEEDLE) ×2 IMPLANT
NS IRRIG 1000ML POUR BTL (IV SOLUTION) ×2 IMPLANT
PACK HIP COMPR (MISCELLANEOUS) ×2 IMPLANT
SCALPEL PROTECTED #10 DISP (BLADE) ×4 IMPLANT
SHELL ACETABULAR DM  46MM (Shell) ×2 IMPLANT
SOL PREP PVP 2OZ (MISCELLANEOUS) ×2
SOLUTION PREP PVP 2OZ (MISCELLANEOUS) ×1 IMPLANT
SPONGE DRAIN TRACH 4X4 STRL 2S (GAUZE/BANDAGES/DRESSINGS) ×2 IMPLANT
STAPLER SKIN PROX 35W (STAPLE) ×2 IMPLANT
STEM FEMORAL SZ2 STD COLLARED (Stem) ×1 IMPLANT
STRAP SAFETY 5IN WIDE (MISCELLANEOUS) ×2 IMPLANT
SUT DVC 2 QUILL PDO  T11 36X36 (SUTURE) ×1
SUT DVC 2 QUILL PDO T11 36X36 (SUTURE) ×1 IMPLANT
SUT SILK 0 (SUTURE) ×2
SUT SILK 0 30XBRD TIE 6 (SUTURE) ×1 IMPLANT
SUT V-LOC 90 ABS DVC 3-0 CL (SUTURE) ×2 IMPLANT
SUT VIC AB 1 CT1 36 (SUTURE) ×2 IMPLANT
SYR 20CC LL (SYRINGE) ×2 IMPLANT
SYR 30ML LL (SYRINGE) ×2 IMPLANT
SYR BULB IRRIG 60ML STRL (SYRINGE) ×2 IMPLANT
TAPE MICROFOAM 4IN (TAPE) ×2 IMPLANT
TOWEL OR 17X26 4PK STRL BLUE (TOWEL DISPOSABLE) ×2 IMPLANT
TRAY FOLEY MTR SLVR 16FR STAT (SET/KITS/TRAYS/PACK) ×2 IMPLANT
WND VAC CANISTER 500ML (MISCELLANEOUS) ×2 IMPLANT

## 2017-09-10 NOTE — [place] FL2 (Signed)
  [place] MEDICAID FL2 LEVEL OF CARE SCREENING TOOL     IDENTIFICATION  Patient Name: [name] Birthdate: 30-May-1945 Sex: female Admission Date (Current Location): 09/10/2017  [facility] [place]   Facility and Address:  [facility], [address], [place], [place] [address]      Provider Number: [id number]  Attending Physician Name and Address:  Kennedy BuckerMenz, Michael, MD  Relative Name and Phone Number:       Current Level of Care: Hospital Recommended Level of Care: Skilled Nursing Facility Prior Approval Number:    Date Approved/Denied:   PASRR Number:    Discharge Plan: SNF    Current Diagnoses: Patient Active Problem List   Diagnosis Date Noted  . Status post total hip replacement, left 09/10/2017  . Chest pain 11/29/2015  . Primary osteoarthritis of right hip 08/04/2015  . Preventative health care 01/15/2011  . WEIGHT LOSS 06/09/2009  . Depressive type psychosis (HCC) 10/12/2008  . CERVICAL RADICULOPATHY, LEFT 10/12/2008  . DIZZINESS 09/29/2008  . DEGENERATIVE DISC DISEASE, CERVICAL SPINE, W/RADICULOPATHY 07/21/2008  . Memory loss 01/09/2008  . Depressive disorder, not elsewhere classified 07/16/2007  . ANEMIA-NOS 01/14/2007  . HYPERTENSION 01/14/2007  . DEGENERATIVE DISC DISEASE, CERVICAL SPINE 01/14/2007  . LOW BACK PAIN 01/14/2007  . HYPERLIPIDEMIA 10/25/2006  . MIGRAINE HEADACHE 10/25/2006  . Insomnia, unspecified 10/25/2006  . ALCOHOL ABUSE, HX OF 10/25/2006  . DIVERTICULITIS, HX OF 10/25/2006  . ANXIETY 09/19/2006  . GERD 09/19/2006    Orientation RESPIRATION BLADDER Height & Weight     [place], Time, Situation, Place  Normal Continent Weight:   Height:     BEHAVIORAL SYMPTOMS/MOOD NEUROLOGICAL BOWEL NUTRITION STATUS      Continent Diet(Diet: Regular )  AMBULATORY STATUS COMMUNICATION OF NEEDS Skin   Extensive Assist Verbally Surgical wounds, Wound Vac(Incision: Left Hip, Provena Wound Vac. )                       Personal Care Assistance Level of Assistance  Bathing, Feeding, Dressing Bathing Assistance: Limited assistance Feeding assistance: Independent Dressing Assistance: Limited assistance     Functional Limitations Info  Sight, Hearing, Speech Sight Info: Adequate Hearing Info: Adequate Speech Info: Adequate    SPECIAL CARE FACTORS FREQUENCY  PT (By licensed PT), OT (By licensed OT)     PT Frequency: (5) OT Frequency: (5)            Contractures      Additional Factors Info  Code Status, Allergies Code Status Info: (Full Code. ) Allergies Info: (Atorvastatin, Aspirin, Codeine, Morphine, Nsaids)           Current Medications (09/10/2017):  This is the current hospital active medication list Current Facility-Administered Medications  Medication Dose Route Frequency Provider Last Rate Last Dose  . fentaNYL (SUBLIMAZE) injection 25 mcg  25 mcg Intravenous Q5 min PRN Naomie DeanKephart, William K, MD      . lactated ringers infusion   Intravenous Continuous Berdine Addisonhomas, Mathai, MD 100 mL/hr at 09/10/17 0830    . ondansetron (ZOFRAN) injection 4 mg  4 mg Intravenous Once PRN Naomie DeanKephart, William K, MD         Discharge Medications: Please see discharge summary for a list of discharge medications.  Relevant Imaging Results:  Relevant Lab Results:   Additional Information (SSN: [id number])  Emilly Lavey, Darleen CrockerBailey M, LCSW

## 2017-09-10 NOTE — Progress Notes (Signed)
Per nursing home patient takes simvastatin daily.

## 2017-09-10 NOTE — Clinical Social Work Note (Signed)
Clinical Social Work Assessment  Patient Details  Name: [name] MRN: [id number] Date of Birth: 1945/08/02  Date of referral:  09/10/17               Reason for consult:  Facility Placement, Other (Comment Required)(From [facility] )                Permission sought to share information with:  Chartered certified accountant granted to share information::  Yes, Verbal Permission Granted  Name::      [place]::   [place]   Relationship::     Contact Information:     Housing/Transportation Living arrangements for the past 2 months:  [place] of Information:  Patient, Adult Children Patient Interpreter Needed:  None Criminal Activity/Legal Involvement Pertinent to Current Situation/Hospitalization:  No - Comment as needed Significant Relationships:  Adult Children Lives with:  Facility Resident Do you feel safe going back to the place where you live?  Yes Need for family participation in patient care:  Yes (Comment)  Care giving concerns:  Patient is a resident at [facility] and came to [facility] for a planned total hip replacement.    Social Worker assessment / plan:  Holiday representative (CSW) received SNF consult. PT is pending. Patient is post op day 0 from a hip replacement. CSW met with patient and her daughter [name] was at bedside. Patient was alert and oriented X4 and was laying in the bed. CSW introduced [place] and explained role of CSW department. Patient reported that she has lived at [facility] for 6 years. Per patient she had her other hip replaced and went to [facility] for 1 week for rehab. Per patient she is open to going back to [facility] or SNF if needed. CSW explained that PT will evaluate patient and make a recommendation of home health or SNF. CSW also explained that medicare requires a 3 night qualifying inpatient stay in a hospital in order to pay for SNF. Patient  was admitted to inpatient on 09/10/17. Per patient she prefers Radiation protection practitioner for [facility]. FL2 complete and faxed out.   CSW presented bed offers to patient and she chose [facility]. Plan is for patient to D/C to [place] Friday 09/13/17 pending medical clearance. [facility] admissions coordinator at [facility] is aware of above.   Employment status:  Retired Forensic scientist:  Medicare PT Recommendations:  Not assessed at this time [place] / Referral to community resources:  [place]  Patient/Family's Response to care:  Patient accepted bed offer from [facility].   Patient/Family's Understanding of and Emotional Response to Diagnosis, Current Treatment, and Prognosis:  Patient was very pleasant and thanked CSW for assistance.   Emotional Assessment Appearance:  Appears stated age Attitude/Demeanor/Rapport:    Affect (typically observed):  Accepting, Adaptable, Pleasant Orientation:  Oriented to [place], Oriented to Place, Oriented to  Time, Oriented to Situation Alcohol / Substance use:  Not Applicable Psych involvement (Current and /or in the community):  No (Comment)  Discharge Needs  Concerns to be addressed:  Discharge Planning Concerns Readmission within the last 30 days:  No Current discharge risk:  Dependent with Mobility Barriers to Discharge:  Continued Medical Work up   [employer], Veronia Beets, LCSW 09/10/2017, 3:08 PM

## 2017-09-10 NOTE — Anesthesia Procedure Notes (Addendum)
Spinal  Patient location during procedure: OR Start time: 09/10/2017 9:45 AM End time: 09/10/2017 9:48 AM Staffing Anesthesiologist: Naomie DeanKephart, Laetitia Schnepf K, MD Resident/CRNA: Junious SilkNoles, Mark, CRNA Other anesthesia staff: Conni SlipperHarris, Marcas Bowsher N, RN Performed: other anesthesia staff  Preanesthetic Checklist Completed: patient identified, site marked, surgical consent, pre-op evaluation, timeout performed, IV checked, risks and benefits discussed and monitors and equipment checked Spinal Block Patient position: sitting Prep: ChloraPrep Patient monitoring: heart rate, continuous pulse ox and blood pressure Approach: midline Location: L4-5 Injection technique: single-shot Needle Needle type: Pencan  Needle gauge: 24 G Needle length: 9 cm Assessment Sensory level: T10

## 2017-09-10 NOTE — Anesthesia Preprocedure Evaluation (Signed)
Anesthesia Evaluation  Patient identified by MRN, date of birth, ID band Patient awake    Reviewed: Allergy & Precautions, NPO status , Patient's Chart, lab work & pertinent test results  History of Anesthesia Complications Negative for: history of anesthetic complications  Airway Mallampati: III       Dental   Pulmonary neg sleep apnea, COPD, Current Smoker,           Cardiovascular hypertension, Pt. on medications (-) Past MI and (-) CHF (-) dysrhythmias (-) Valvular Problems/Murmurs     Neuro/Psych neg Seizures Anxiety Depression Schizophrenia    GI/Hepatic Neg liver ROS, GERD  Medicated,  Endo/Other  neg diabetes  Renal/GU negative Renal ROS     Musculoskeletal   Abdominal   Peds  Hematology  (+) anemia ,   Anesthesia Other Findings   Reproductive/Obstetrics                             Anesthesia Physical Anesthesia Plan  ASA: III  Anesthesia Plan: Spinal   Post-op Pain Management:    Induction:   PONV Risk Score and Plan:   Airway Management Planned:   Additional Equipment:   Intra-op Plan:   Post-operative Plan:   Informed Consent: I have reviewed the patients History and Physical, chart, labs and discussed the procedure including the risks, benefits and alternatives for the proposed anesthesia with the patient or authorized representative who has indicated his/her understanding and acceptance.     Plan Discussed with:   Anesthesia Plan Comments:         Anesthesia Quick Evaluation

## 2017-09-10 NOTE — Evaluation (Signed)
Physical Therapy Evaluation Patient Details Name: [name] MRN: [id number] DOB: [birth year] Today's Date: 09/10/2017   History of Present Illness  admitted for acute hospitalization status post L THR, anterior approach, WBAT (09/10/17)  Clinical Impression  Upon evaluation, patient alert and oriented; follows all commands and demonstrates good effort with all mobility tasks.  L hip strength (3-/5) and ROM generally appropriate for functional mobility and transfers; moderately limited by pain (7/10).  Able to complete bed mobility with sup; sit/stand, basic transfers and gait (5') with RW, cga/min assist.  Decreased stance time/weight acceptance L LE.  Refused further gait distance. Would benefit from skilled PT to address above deficits and promote optimal return to PLOF; recommend transition to STR upon discharge from acute hospitalization.     Follow Up Recommendations SNF    Equipment Recommendations       Recommendations for Other Services       Precautions / Restrictions Precautions Precautions: Fall Restrictions Weight Bearing Restrictions: No      Mobility  Bed Mobility Overal bed mobility: Needs Assistance Bed Mobility: Supine to Sit     Supine to sit: Supervision     General bed mobility comments: good ability to Capp-manage L LE  Transfers Overall transfer level: Needs assistance Equipment used: Rolling walker (2 wheeled) Transfers: Sit to/from Stand Sit to Stand: Min guard;Supervision         General transfer comment: cuing for hand placement, overall safety  Ambulation/Gait Ambulation/Gait assistance: Min guard Gait Distance (Feet): 5 Feet Assistive device: Rolling walker (2 wheeled)       General Gait Details: 3-point, step to gait pattern; decreased stance time/weight shift to L LE; forward flexed posture with mod WBing bilat UEs.  Refused additional gait distance  Stairs            Wheelchair Mobility    Modified Rankin (Stroke  Patients Only)       Balance Overall balance assessment: Needs assistance Sitting-balance support: No upper extremity supported;Feet supported Sitting balance-Leahy Scale: Good     Standing balance support: Bilateral upper extremity supported Standing balance-Leahy Scale: Fair                               Pertinent Vitals/Pain Pain Assessment: 0-10 Pain Score: 7  Pain Location: L hip Pain Descriptors / Indicators: Aching;Grimacing;Guarding Pain Intervention(s): Limited activity within patient's tolerance;Monitored during session;Repositioned;Patient requesting pain meds-RN notified;RN gave pain meds during session    Home Living Family/patient expects to be discharged to:: Assisted living               Home Equipment: Dan Humphreys - 4 wheels Additional Comments: Resident of [facility] x6 years, single-level with no steps required    Prior Function Level of Independence: Independent with assistive device(s)         Comments: Mod indep with 4WRW for ADLs, household mobilization without assist device; denies fall history.     Hand Dominance        Extremity/Trunk Assessment   Upper Extremity Assessment Upper Extremity Assessment: Overall WFL for tasks assessed    Lower Extremity Assessment Lower Extremity Assessment: (L hip grossly at least 3-/5, limited by pain; otherwise, grossly 4+/5 throughout)       Communication   Communication: No difficulties  Cognition Arousal/Alertness: Awake/alert Behavior During Therapy: WFL for tasks assessed/performed Overall Cognitive Status: Within Functional Limits for tasks assessed  General Comments      Exercises Other Exercises Other Exercises: Supine L LE therex, 1x10, act ROM: ankle pumps, quad sets, SAQ, heel slides, hip abduct/adduct.  Good isolated hip activation and movement   Assessment/Plan    PT Assessment Patient needs continued  PT services  PT Problem List Decreased strength;Decreased range of motion;Decreased activity tolerance;Decreased balance;Decreased mobility;Decreased coordination;Decreased knowledge of use of DME;Decreased safety awareness;Decreased knowledge of precautions;Pain       PT Treatment Interventions DME instruction;Gait training;Functional mobility training;Therapeutic activities;Therapeutic exercise;Balance training;Patient/family education    PT Goals (Current goals can be found in the Care Plan section)  Acute Rehab PT Goals Patient Stated Goal: to try a little PT Goal Formulation: With patient Time For Goal Achievement: 09/24/17 Potential to Achieve Goals: Good    Frequency BID   Barriers to discharge        Co-evaluation               AM-PAC PT "6 Clicks" Daily Activity  Outcome Measure Difficulty turning over in bed (including adjusting bedclothes, sheets and blankets)?: A Little Difficulty moving from lying on back to sitting on the side of the bed? : A Little Difficulty sitting down on and standing up from a chair with arms (e.g., wheelchair, bedside commode, etc,.)?: Unable Help needed moving to and from a bed to chair (including a wheelchair)?: A Little Help needed walking in hospital room?: A Little Help needed climbing 3-5 steps with a railing? : A Little 6 Click Score: 16    End of Session Equipment Utilized During Treatment: Gait belt Activity Tolerance: Patient tolerated treatment well Patient left: in chair;with call bell/phone within reach;with chair alarm set;with nursing/sitter in room Nurse Communication: Mobility status PT Visit Diagnosis: Muscle weakness (generalized) (M62.81);Difficulty in walking, not elsewhere classified (R26.2);Pain Pain - Right/Left: Left Pain - part of body: Hip    Time: 1530-1556 PT Time Calculation (min) (ACUTE ONLY): 26 min   Charges:   PT Evaluation $PT Eval Moderate Complexity: 1 Mod PT Treatments $Therapeutic  Exercise: 8-22 mins   PT G Codes:       Ajahnae Rathgeber H. Manson PasseyBrown, PT, DPT, NCS 09/10/17, 4:33 PM [phone]

## 2017-09-10 NOTE — Anesthesia Post-op Follow-up Note (Signed)
Anesthesia QCDR form completed.        

## 2017-09-10 NOTE — Clinical Social Work Placement (Signed)
   CLINICAL SOCIAL WORK PLACEMENT  NOTE  Date:  09/10/2017  Patient Details  Name: [name] MRN: [id number] Date of Birth: April 29, 1945  Clinical Social Work is seeking post-discharge placement for this patient at the Skilled  Nursing Facility level of care (*CSW will initial, date and re-position this form in  chart as items are completed):  Yes   Patient/family provided with [facility] Clinical Social Work Department's list of facilities offering this level of care within the geographic area requested by the patient (or if unable, by the patient's family).  Yes   Patient/family informed of their freedom to choose among providers that offer the needed level of care, that participate in Medicare, Medicaid or managed care program needed by the patient, have an available bed and are willing to accept the patient.  Yes   Patient/family informed of [facility]'s ownership interest in [facility] and [facility], as well as of the fact that they are under no obligation to receive care at these facilities.  PASRR submitted to EDS on 09/10/17     PASRR number received on       Existing PASRR number confirmed on       FL2 transmitted to all facilities in geographic area requested by pt/family on 09/10/17     FL2 transmitted to all facilities within larger geographic area on       Patient informed that his/her managed care company has contracts with or will negotiate with certain facilities, including the following:            Patient/family informed of bed offers received.  Patient chooses bed at       Physician recommends and patient chooses bed at      Patient to be transferred to   on  .  Patient to be transferred to facility by       Patient family notified on   of transfer.  Name of family member notified:        PHYSICIAN       Additional Comment:    _______________________________________________ Lambros Cerro, Darleen CrockerBailey M, LCSW 09/10/2017, 3:08 PM

## 2017-09-10 NOTE — Transfer of Care (Signed)
Immediate Anesthesia Transfer of Care Note  Patient: Laurie JarvisLinda S [name]  Procedure(s) Performed: TOTAL HIP ARTHROPLASTY ANTERIOR APPROACH (Left Hip)  Patient Location: PACU  Anesthesia Type:Spinal  Level of Consciousness: awake, alert , oriented and patient cooperative  Airway & Oxygen Therapy: Patient Spontanous Breathing and Patient connected to face mask oxygen  Post-op Assessment: Report given to RN and Post -op Vital signs reviewed and stable  Post vital signs: Reviewed and stable  Last Vitals:  Vitals Value Taken Time  BP    Temp    Pulse 82 09/10/2017 11:06 AM  Resp 20 09/10/2017 11:06 AM  SpO2 98 % 09/10/2017 11:06 AM  Vitals shown include unvalidated device data.  Last Pain:  Vitals:   09/10/17 0827  TempSrc: Tympanic  PainSc: 4          Complications: No apparent anesthesia complications

## 2017-09-10 NOTE — H&P (Signed)
Reviewed paper H+P, will be scanned into chart. No changes noted.  

## 2017-09-10 NOTE — Op Note (Signed)
09/10/2017  11:05 AM  PATIENT:  Laurie JarvisLinda S Draeger  72 y.o. female  PRE-OPERATIVE DIAGNOSIS:  primary localized osteoarthritis of left hip  POST-OPERATIVE DIAGNOSIS:  primary localized osteoarthritis of left hip  PROCEDURE:  Procedure(s): TOTAL HIP ARTHROPLASTY ANTERIOR APPROACH (Left)  SURGEON: Leitha SchullerMichael J Kyndal Heringer, MD  ASSISTANTS: None  ANESTHESIA:   spinal  EBL:  Total I/O In: 500 [I.V.:500] Out: 350 [Urine:100; Blood:250]  BLOOD ADMINISTERED:none  DRAINS: none   LOCAL MEDICATIONS USED:  MARCAINE     SPECIMEN:  Source of Specimen:  Left femoral head  DISPOSITION OF SPECIMEN:  PATHOLOGY  COUNTS:  YES  TOURNIQUET:  * No tourniquets in log *  IMPLANTS: Medacta 2 std AMIS stem, 46 mm Mpact DM cup with liner, S 22.2 mm metal head  DICTATION: .Dragon Dictation   The patient was brought to the operating room and after spinal anesthesia was obtained patient was placed on the operative table with the ipsilateral foot into the Medacta attachment, contralateral leg on a well-padded table. C-arm was brought in and preop template x-ray taken. After prepping and draping in usual sterile fashion appropriate patient identification and timeout procedures were completed. Anterior approach to the hip was obtained and centered over the greater trochanter and TFL muscle. The subcutaneous tissue was incised hemostasis being achieved by electrocautery. TFL fascia was incised and the muscle retracted laterally deep retractor placed. The lateral femoral circumflex vessels were identified and ligated. The anterior capsule was exposed and a capsulotomy performed. The neck was identified and a femoral neck cut carried out with a saw. The head was removed without difficulty and showed sclerotic femoral head and acetabulum. Reaming was carried out to 46 mm and a 46 mm cup trial gave appropriate tightness to the acetabular component a 46 p.m. cup was impacted into position. The leg was then externally rotated and  ischiofemoral and pubofemoral releases carried out. The femur was sequentially broached to a size 2, size 2 standard with as had trials were placed and the final components chosen. The 2 standard stem was inserted along with a S 22.2 metal mm head and 46 mm liner. The hip was reduced and was stable the wound was thoroughly irrigated with fibrillar placed along the posterior capsule release and medial neck. The deep fascia was closed using a heavy Quill after infiltration of 30 cc of quarter percent Sensorcaine with epinephrine. 3-0 v-loc for subcuticular closure followed by skin staples and incisional wound VAC  PLAN OF CARE: Admit to inpatient

## 2017-09-11 ENCOUNTER — Other Ambulatory Visit: Payer: Self-pay

## 2017-09-11 LAB — BASIC METABOLIC PANEL
ANION GAP: 8 (ref 5–15)
BUN: 10 mg/dL (ref 8–23)
CO2: 19 mmol/L — AB (ref 22–32)
Calcium: 8.7 mg/dL — ABNORMAL LOW (ref 8.9–10.3)
Chloride: 110 mmol/L (ref 98–111)
Creatinine, Ser: 0.63 mg/dL (ref 0.44–1.00)
GFR calc non Af Amer: >60 mL/min (ref >60)
GLUCOSE: 134 mg/dL — AB (ref 70–99)
POTASSIUM: 4.2 mmol/L (ref 3.5–5.1)
Sodium: 137 mmol/L (ref 135–145)

## 2017-09-11 LAB — CBC
HCT: 35.3 % (ref 35.0–47.0)
HEMOGLOBIN: 12.2 g/dL (ref 12.0–16.0)
MCH: 34 pg (ref 26.0–34.0)
MCHC: 34.7 g/dL (ref 32.0–36.0)
MCV: 97.8 fL (ref 80.0–100.0)
Platelets: 217 10*3/uL (ref 150–440)
RBC: 3.61 MIL/uL — AB (ref 3.80–5.20)
RDW: 14.8 % — ABNORMAL HIGH (ref 11.5–14.5)
WBC: 14.6 10*3/uL — ABNORMAL HIGH (ref 3.6–11.0)

## 2017-09-11 MED ORDER — OXYCODONE HCL 5 MG PO TABS
5.0000 mg | ORAL_TABLET | ORAL | Status: DC | PRN
  Administered 2017-09-11: 5 mg via ORAL
  Filled 2017-09-11: qty 1

## 2017-09-11 NOTE — Progress Notes (Addendum)
   Subjective: 1 Day Post-Op Procedure(s) (LRB): TOTAL HIP ARTHROPLASTY ANTERIOR APPROACH (Left) Patient reports pain as moderate.   Patient is well, and has had no acute complaints or problems Denies any CP, SOB, ABD pain. We will continue therapy today.  Plan is to go Home after hospital stay.  Objective: Vital signs in last 24 hours: Temp:  [96.5 F (35.8 C)-98.4 F (36.9 C)] 98.4 F (36.9 C) (07/17 0812) Pulse Rate:  [63-94] 75 (07/17 0812) Resp:  [12-20] 18 (07/17 0422) BP: (91-144)/(55-87) 117/60 (07/17 0812) SpO2:  [95 %-100 %] 97 % (07/17 0812) Weight:  [90.3 kg (199 lb)] 90.3 kg (199 lb) (07/16 1450)  Intake/Output from previous day: 07/16 0701 - 07/17 0700 In: 1355 [P.O.:120; I.V.:1235] Out: 2550 [Urine:2300; Blood:250] Intake/Output this shift: No intake/output data recorded.  Recent Labs    09/11/17 0441  HGB 12.2   Recent Labs    09/11/17 0441  WBC 14.6*  RBC 3.61*  HCT 35.3  PLT 217   Recent Labs    09/11/17 0441  NA 137  K 4.2  CL 110  CO2 19*  BUN 10  CREATININE 0.63  GLUCOSE 134*  CALCIUM 8.7*   No results for input(s): LABPT, INR in the last 72 hours.  EXAM General - Patient is Alert, Appropriate and Oriented Extremity - Neurovascular intact Sensation intact distally Intact pulses distally Dorsiflexion/Plantar flexion intact No cellulitis present Compartment soft Dressing - dressing C/D/I and no drainage, wound vac intact Motor Function - intact, moving foot and toes well on exam.   Past Medical History:  Diagnosis Date  . ALCOHOL ABUSE, HX OF   . ANEMIA-NOS   . ANXIETY   . CERVICAL RADICULOPATHY, LEFT    5 BACK OPERATIONS AND 2 NECK FUSIONS  . DEGENERATIVE DISC DISEASE, CERVICAL SPINE   . DEGENERATIVE DISC DISEASE, CERVICAL SPINE, W/RADICULOPATHY   . Depressive disorder, not elsewhere classified   . Depressive type psychosis (HCC)   . DIVERTICULITIS, HX OF    CLEARED ACCORDING TO PATIENT  . DIZZINESS   . GERD   .  HYPERLIPIDEMIA   . HYPERTENSION   . Insomnia, unspecified   . LOW BACK PAIN    RUPTURED DISC  . Memory loss   . MIGRAINE HEADACHE 09/06/2017   NOT CURRENTLY  . Palpitation    NOT A PROBLEM PER PATIENT  . WEIGHT LOSS     Assessment/Plan:   1 Day Post-Op Procedure(s) (LRB): TOTAL HIP ARTHROPLASTY ANTERIOR APPROACH (Left) Active Problems:   Status post total hip replacement, left  Estimated body mass index is 32.12 kg/m as calculated from the following:   Height as of this encounter: 5\' 6"  (1.676 m).   Weight as of this encounter: 90.3 kg (199 lb). Advance diet Up with therapy  Needs BM VSS Labs stable CM to assist with discharge   DVT Prophylaxis - Aspirin, TED hose and SCD Weight-Bearing as tolerated to left leg   T. Cranston Neighborhris Gaines, PA-C [facility] 09/11/2017, 8:22 AM

## 2017-09-11 NOTE — Progress Notes (Signed)
Spoke with Dr. Rosita KeaMenz because patient has reached Tylenol level and needs something else for pain. Order given for Oxycodone 5-10mg  orally q 3 hrs prn when Tylenol limit is reached.

## 2017-09-11 NOTE — Progress Notes (Signed)
OT Cancellation Note  Patient Details Name: Laurie LollLinda S [name] MRN: [id number] DOB: Aug 17, 1945   Cancelled Treatment:    Reason Eval/Treat Not Completed: Patient at procedure or test/ unavailable(Pt. with PT . Will reaatempt OT inital eval at a later time.)  Olegario MessierElaine Chanika Byland, MS, OTR/L 09/11/2017, 9:40 AM

## 2017-09-11 NOTE — Progress Notes (Signed)
OT Cancellation Note  Patient Details Name: [name] MRN: [id number] DOB: February 07, 1946   Cancelled Treatment:    Reason Eval/Treat Not Completed: Patient at procedure or test/ unavailable(Pt.'s door close, and with nursing. WIll reattempt initial eval at a later time.)  Olegario MessierElaine Yliana Gravois, MS, OTR/L 09/11/2017, 12:20 PM

## 2017-09-11 NOTE — Progress Notes (Signed)
OT Cancellation Note  Patient Details Name: Laurie LollLinda S [name] MRN: [id number] DOB: 08-19-1945   Cancelled Treatment:    Reason Eval/Treat Not Completed: Patient declined, no reason specified;Fatigue/lethargy limiting ability to participate. On 4th attempt to see this date, pt reporting feeling very sleepy and politely declines OT this date. Requests OT come back next date.  Richrd PrimeJamie Stiller, MPH, MS, OTR/L ascom [phone] 09/11/17, 3:44 PM

## 2017-09-11 NOTE — Progress Notes (Signed)
OT Cancellation Note  Patient Details Name: [name] MRN: [id number] DOB: 1946-02-26   Cancelled Treatment:    Reason Eval/Treat Not Completed: Patient at procedure or test/ unavailable(Nursing/CNA with pt. Will reattempt OT initial eval at a later time.[name].Olegario Messier)  Taura Lamarre, MS, OTR/L 09/11/2017, 9:22 AM

## 2017-09-11 NOTE — Progress Notes (Signed)
Physical Therapy Treatment Patient Details Name: [name] MRN: [id number] DOB: February 04, 1946 Today's Date: 09/11/2017    History of Present Illness admitted for acute hospitalization status post L THR, anterior approach, WBAT (09/10/17)    PT Comments    Does report increased stiffness/soreness to L hip this date, requiring increased assist with bed mobility efforts.  Progressive increase in gait distance, though continues with limited stance time/weight acceptance L LE throughout gait cycle. Agreeable to remain OOB in chair with mod encouragement; requested to remain up in chair for at least one hour. Reinforced need for staff assist with return to bed/mobility efforts.    Follow Up Recommendations  SNF     Equipment Recommendations       Recommendations for Other Services       Precautions / Restrictions Precautions Precautions: Fall Restrictions Weight Bearing Restrictions: Yes RLE Weight Bearing: Weight bearing as tolerated    Mobility  Bed Mobility Overal bed mobility: Needs Assistance Bed Mobility: Supine to Sit     Supine to sit: Min assist     General bed mobility comments: min assist for management of L UE this date; increased stiffness/soreness reported  Transfers Overall transfer level: Needs assistance Equipment used: Rolling walker (2 wheeled) Transfers: Sit to/from Stand Sit to Stand: Min guard;Min assist         General transfer comment: cuing for hand placement; generally impulsive  Ambulation/Gait Ambulation/Gait assistance: Min guard;Min assist Gait Distance (Feet): 90 Feet Assistive device: Rolling walker (2 wheeled)       General Gait Details: 3-point, step to progressing to partially reciprocal stepping pattern; able to indep advance L LE, but demonstrates limited stance time/weight acceptance; maintains forward trunk flexion with mod WBing bilat UEs   Stairs             Wheelchair Mobility    Modified Rankin (Stroke  Patients Only)       Balance Overall balance assessment: Needs assistance Sitting-balance support: No upper extremity supported;Feet supported Sitting balance-Leahy Scale: Good     Standing balance support: Bilateral upper extremity supported Standing balance-Leahy Scale: Fair                              Cognition Arousal/Alertness: Awake/alert Behavior During Therapy: WFL for tasks assessed/performed Overall Cognitive Status: Within Functional Limits for tasks assessed                                        Exercises Other Exercises Other Exercises: Seated LE therex, 1x10, AROM: ankle pumps, LAQs, iso hip adduct, marching (act assist).  Maintains R lateral weight in all sitting/standing postures due to pain in L hip; limited receptiveness to cuing/positioning to promote neutral alignment/WBing    General Comments        Pertinent Vitals/Pain Pain Assessment: 0-10 Pain Score: 6  Pain Location: L hip Pain Descriptors / Indicators: Aching;Grimacing;Guarding Pain Intervention(s): Limited activity within patient's tolerance;Monitored during session;Repositioned    Home Living                      Prior Function            PT Goals (current goals can now be found in the care plan section) Acute Rehab PT Goals Patient Stated Goal: to try a little PT Goal Formulation: With patient Time For  Goal Achievement: 09/24/17 Potential to Achieve Goals: Good Progress towards PT goals: Progressing toward goals    Frequency    BID      PT Plan Current plan remains appropriate    Co-evaluation              AM-PAC PT "6 Clicks" Daily Activity  Outcome Measure  Difficulty turning over in bed (including adjusting bedclothes, sheets and blankets)?: A Little Difficulty moving from lying on back to sitting on the side of the bed? : A Little Difficulty sitting down on and standing up from a chair with arms (e.g., wheelchair, bedside  commode, etc,.)?: Unable Help needed moving to and from a bed to chair (including a wheelchair)?: A Little Help needed walking in hospital room?: A Little Help needed climbing 3-5 steps with a railing? : A Little 6 Click Score: 16    End of Session Equipment Utilized During Treatment: Gait belt Activity Tolerance: Patient tolerated treatment well Patient left: in chair;with call bell/phone within reach;with chair alarm set;with nursing/sitter in room Nurse Communication: Mobility status PT Visit Diagnosis: Muscle weakness (generalized) (M62.81);Difficulty in walking, not elsewhere classified (R26.2);Pain Pain - Right/Left: Left Pain - part of body: Hip     Time: [id number] PT Time Calculation (min) (ACUTE ONLY): 30 min  Charges:  $Gait Training: 8-22 mins $Therapeutic Exercise: 8-22 mins                    G Codes:       Doye Montilla H. Manson PasseyBrown, PT, DPT, NCS 09/11/17, 10:07 AM [phone]

## 2017-09-11 NOTE — Anesthesia Postprocedure Evaluation (Signed)
Anesthesia Post Note  Patient: Laurie JarvisLinda S [name]  Procedure(s) Performed: TOTAL HIP ARTHROPLASTY ANTERIOR APPROACH (Left Hip)  Patient location during evaluation: Nursing Unit Anesthesia Type: Spinal Level of consciousness: awake, awake and alert and oriented Pain management: pain level controlled Vital Signs Assessment: post-procedure vital signs reviewed and stable Respiratory status: spontaneous breathing, nonlabored ventilation and respiratory function stable Cardiovascular status: stable Anesthetic complications: no     Last Vitals:  Vitals:   09/10/17 2347 09/11/17 0422  BP: 125/73 121/69  Pulse: 90 78  Resp: 18 18  Temp: 36.7 C 36.5 C  SpO2: 97% 98%    Last Pain:  Vitals:   09/11/17 0516  TempSrc:   PainSc: 6                  Microsofthuy Milon Dethloff

## 2017-09-11 NOTE — Clinical Social Work Placement (Signed)
   CLINICAL SOCIAL WORK PLACEMENT  NOTE  Date:  09/11/2017  Patient Details  Name: Laurie JarvisLinda S Creely MRN: [id number] Date of Birth: 07-24-1945  Clinical Social Work is seeking post-discharge placement for this patient at the Skilled  Nursing Facility level of care (*CSW will initial, date and re-position this form in  chart as items are completed):  Yes   Patient/family provided with [facility] Social Work Department's list of facilities offering this level of care within the geographic area requested by the patient (or if unable, by the patient's family).  Yes   Patient/family informed of their freedom to choose among providers that offer the needed level of care, that participate in Medicare, Medicaid or managed care program needed by the patient, have an available bed and are willing to accept the patient.  Yes   Patient/family informed of [facility] ownership interest in [facility] and [facility], as well as of the fact that they are under no obligation to receive care at these facilities.  PASRR submitted to EDS on 09/10/17     PASRR number received on 09/11/17     Existing PASRR number confirmed on       FL2 transmitted to all facilities in geographic area requested by pt/family on 09/10/17     FL2 transmitted to all facilities within larger geographic area on       Patient informed that his/her managed care company has contracts with or will negotiate with certain facilities, including the following:        Yes   Patient/family informed of bed offers received.  Patient chooses bed at [facility]     Physician recommends and patient chooses bed at      Patient to be transferred to   on  .  Patient to be transferred to facility by       Patient family notified on   of transfer.  Name of family member notified:        PHYSICIAN       Additional Comment:    _______________________________________________ [name], Darleen CrockerBailey M, LCSW 09/11/2017,  9:04 AM

## 2017-09-11 NOTE — Progress Notes (Signed)
Plan is for patient to D/C to [facility] Friday 09/13/17 pending medical clearance. [facility] is aware of above. Patient is aware of above.   Baker Hughes IncorporatedBailey Chrysten Woulfe, LCSW [phone]

## 2017-09-11 NOTE — Progress Notes (Signed)
Physical Therapy Treatment Patient Details Name: DETTA [name] MRN: [id number] DOB: December 05, 1945 Today's Date: 09/11/2017    History of Present Illness admitted for acute hospitalization status post L THR, anterior approach, WBAT (09/10/17)    PT Comments    Patient limited by pain this session, participating in very minimal fashion with mobility.  With encouragement, willing to attempt short-distance gait, but tolerated only 25' with RW, min assist.  RN aware of pain complaints. Did encourage OOB to chair this PM for dinner; patient voiced understanding.   Follow Up Recommendations  SNF     Equipment Recommendations       Recommendations for Other Services       Precautions / Restrictions Precautions Precautions: Fall;Anterior Hip Restrictions Weight Bearing Restrictions: Yes RLE Weight Bearing: Weight bearing as tolerated    Mobility  Bed Mobility Overal bed mobility: Needs Assistance Bed Mobility: Supine to Sit;Sit to Supine     Supine to sit: Min assist Sit to supine: Min assist   General bed mobility comments: unable/unwilling to attempt mobilization of L LE without assist from therapist  Transfers Overall transfer level: Needs assistance Equipment used: Rolling walker (2 wheeled) Transfers: Sit to/from Stand Sit to Stand: Min assist         General transfer comment: fair carry-over of hand placement; heavy weight shift to L LE; mild 'lag' in LE power with sit/stand transition  Ambulation/Gait Ambulation/Gait assistance: Min assist Gait Distance (Feet): 25 Feet Assistive device: Rolling walker (2 wheeled)       General Gait Details: 3-point gait pattern; very effortful and deliberate with gait performance, limited by pain.  Refused further gait distance (despite encouragement) due to pain   Stairs             Wheelchair Mobility    Modified Rankin (Stroke Patients Only)       Balance Overall balance assessment: Needs  assistance Sitting-balance support: No upper extremity supported;Feet supported Sitting balance-Leahy Scale: Good     Standing balance support: Bilateral upper extremity supported Standing balance-Leahy Scale: Fair                              Cognition Arousal/Alertness: Awake/alert Behavior During Therapy: WFL for tasks assessed/performed Overall Cognitive Status: Within Functional Limits for tasks assessed                                        Exercises      General Comments        Pertinent Vitals/Pain Pain Assessment: Faces Faces Pain Scale: Hurts even more Pain Location: L hip Pain Descriptors / Indicators: Aching;Grimacing;Guarding Pain Intervention(s): Limited activity within patient's tolerance;Monitored during session;Repositioned    Home Living                      Prior Function            PT Goals (current goals can now be found in the care plan section) Acute Rehab PT Goals Patient Stated Goal: to try a little PT Goal Formulation: With patient Time For Goal Achievement: 09/24/17 Potential to Achieve Goals: Good Progress towards PT goals: Progressing toward goals    Frequency    BID      PT Plan Current plan remains appropriate    Co-evaluation  AM-PAC PT "6 Clicks" Daily Activity  Outcome Measure  Difficulty turning over in bed (including adjusting bedclothes, sheets and blankets)?: Unable Difficulty moving from lying on back to sitting on the side of the bed? : Unable Difficulty sitting down on and standing up from a chair with arms (e.g., wheelchair, bedside commode, etc,.)?: Unable Help needed moving to and from a bed to chair (including a wheelchair)?: A Lot Help needed walking in hospital room?: A Lot Help needed climbing 3-5 steps with a railing? : Total 6 Click Score: 8    End of Session Equipment Utilized During Treatment: Gait belt Activity Tolerance: Patient tolerated  treatment well Patient left: in bed;with bed alarm set;with call bell/phone within reach Nurse Communication: Mobility status PT Visit Diagnosis: Muscle weakness (generalized) (M62.81);Difficulty in walking, not elsewhere classified (R26.2);Pain Pain - Right/Left: Left Pain - part of body: Hip     Time: [id number] PT Time Calculation (min) (ACUTE ONLY): 13 min  Charges:  $Gait Training: 8-22 mins                    G Codes:      Jimie Kuwahara H. Manson PasseyBrown, PT, DPT, NCS 09/11/17, 2:39 PM [phone]

## 2017-09-12 LAB — CBC
HCT: 35.3 % (ref 35.0–47.0)
HEMOGLOBIN: 12.1 g/dL (ref 12.0–16.0)
MCH: 33.4 pg (ref 26.0–34.0)
MCHC: 34.3 g/dL (ref 32.0–36.0)
MCV: 97.5 fL (ref 80.0–100.0)
Platelets: 198 10*3/uL (ref 150–440)
RBC: 3.61 MIL/uL — AB (ref 3.80–5.20)
RDW: 14.6 % — ABNORMAL HIGH (ref 11.5–14.5)
WBC: 11.9 10*3/uL — AB (ref 3.6–11.0)

## 2017-09-12 LAB — BASIC METABOLIC PANEL
Anion gap: 8 (ref 5–15)
BUN: 11 mg/dL (ref 8–23)
CHLORIDE: 109 mmol/L (ref 98–111)
CO2: 22 mmol/L (ref 22–32)
CREATININE: 0.52 mg/dL (ref 0.44–1.00)
Calcium: 8.9 mg/dL (ref 8.9–10.3)
GFR calc Af Amer: >60 mL/min (ref >60)
GFR calc non Af Amer: >60 mL/min (ref >60)
GLUCOSE: 135 mg/dL — AB (ref 70–99)
Potassium: 3.5 mmol/L (ref 3.5–5.1)
SODIUM: 139 mmol/L (ref 135–145)

## 2017-09-12 LAB — SURGICAL PATHOLOGY

## 2017-09-12 MED ORDER — HYDROCODONE-ACETAMINOPHEN 5-325 MG PO TABS: 1.0000 | ORAL_TABLET | ORAL | 0 refills | Status: DC | PRN

## 2017-09-12 MED ORDER — DOCUSATE SODIUM 100 MG PO CAPS: 100.0000 mg | ORAL_CAPSULE | Freq: Two times a day (BID) | ORAL | 0 refills | Status: DC

## 2017-09-12 MED ORDER — ASPIRIN 325 MG PO TBEC: 325.0000 mg | DELAYED_RELEASE_TABLET | Freq: Every day | ORAL | 0 refills | Status: AC

## 2017-09-12 NOTE — Evaluation (Signed)
Occupational Therapy Evaluation Patient Details Name: [name] MRN: [id number] DOB: [birth year] Today's Date: 09/12/2017    History of Present Illness Pt is 72 y/o F with MPH L cervical radiculopathy, DJD, HLD, HTN, and sizzy spells who is s/p L THR Anterior approach 09/10/17   Clinical Impression   Pt seen for OT evaluation this date, POD#1 from above surgery. Pt was independent in all ADLs prior to surgery, lived in an ALF so received assistance for IADLs, and was using 4WW for functional mobility d/t L hip pain. Pt is eager to return to PLOF with less pain and improved safety and independence. Pt currently requires MOD A for LB dressing while in seated position without AE due to pain and limited AROM of L hip. Pt instructed in use of AE to simplify [place] care skills in the setting of increase pain post-op as well as fall prevention strategies, home/routines modifications, and  compression stocking mgt strategies. Pt would benefit from additional instruction in [place] care skills and techniques to help maintain precautions with or without assistive devices to support recall and carryover prior to discharge. Recommend SNF upon discharge.      Follow Up Recommendations  SNF    Equipment Recommendations       Recommendations for Other Services       Precautions / Restrictions Precautions Precautions: Fall;Anterior Hip Restrictions Weight Bearing Restrictions: Yes RLE Weight Bearing: Weight bearing as tolerated LLE Weight Bearing: Weight bearing as tolerated      Mobility Bed Mobility Overal bed mobility: Needs Assistance Bed Mobility: Supine to Sit;Sit to Supine     Supine to sit: Min assist Sit to supine: Min guard   General bed mobility comments: Pt uses R LE to hook and drag LLE to edge of bed but requires therapist assist at trunk to come into full sitting position.   Transfers Overall transfer level: Needs assistance Equipment used: Rolling walker (2 wheeled) Transfers:  Sit to/from Stand Sit to Stand: Min assist         General transfer comment: Pt requires one cue for safe hand placement with FWW for sit to stand from EOB    Balance Overall balance assessment: Needs assistance Sitting-balance support: No upper extremity supported;Feet supported Sitting balance-Leahy Scale: Good     Standing balance support: Bilateral upper extremity supported Standing balance-Leahy Scale: Fair                             ADL either performed or assessed with clinical judgement   ADL Overall ADL's : Needs assistance/impaired             Lower Body Bathing: Minimal assistance       Lower Body Dressing: Moderate assistance(MOD A without AE)   Toilet Transfer: Minimal assistance;BSC   Toileting- Clothing Manipulation and Hygiene: Min guard       Functional mobility during ADLs: Minimal assistance;Rolling walker General ADL Comments: Pt requires MIN verbal cues for weight shifting to take 3 side steps to her left to optimize positioning prior to sitting at EOB in prep for sit to sup.      Vision Baseline Vision/History: Wears glasses Wears Glasses: Reading only Patient Visual Report: No change from baseline       Perception     Praxis      Pertinent Vitals/Pain Pain Assessment: 0-10 Pain Score: 5  Pain Location: L hip with fxl mobility Pain Descriptors / Indicators: Aching Pain  Intervention(s): Limited activity within patient's tolerance;Monitored during session;Patient requesting pain meds-RN notified;RN gave pain meds during session     Hand Dominance     Extremity/Trunk Assessment Upper Extremity Assessment Upper Extremity Assessment: Overall WFL for tasks assessed   Lower Extremity Assessment Lower Extremity Assessment: RLE deficits/detail;LLE deficits/detail RLE Deficits / Details: grossly 3/5 LLE Deficits / Details: grossly 3/5 LLE: Unable to fully assess due to pain       Communication  Communication Communication: No difficulties   Cognition Arousal/Alertness: Awake/alert Behavior During Therapy: WFL for tasks assessed/performed Overall Cognitive Status: Within Functional Limits for tasks assessed                                     General Comments       Exercises Other Exercises Other Exercises: Pt educated on use of AE for LB dressing including demonstration with pt and daughter present confirming understanding of technique. Other Exercises: Pt educated on safe use (hand/foot placement) with use of FWW for functional t/f's. pt demonstrates understanding and makes appropriate adjustments.    Shoulder Instructions      Home Living Family/patient expects to be discharged to:: Assisted living                             Home Equipment: Dan HumphreysWalker - 4 wheels   Additional Comments: Resident of [facility]F x6 years, single-level with no steps required      Prior Functioning/Environment Level of Independence: Independent with assistive device(s)        Comments: Mod indep with 4WRW for ADLs, household mobilization without assist device; denies fall history.        OT Problem List: Decreased strength;Decreased range of motion;Decreased activity tolerance;Impaired balance (sitting and/or standing);Impaired vision/perception;Decreased knowledge of use of DME or AE;Pain      OT Treatment/Interventions: [place]-care/ADL training;Therapeutic exercise;Energy conservation;DME and/or AE instruction;Therapeutic activities;Patient/family education;Balance training    OT Goals(Current goals can be found in the care plan section) Acute Rehab OT Goals Patient Stated Goal: To get back home OT Goal Formulation: With patient/family Time For Goal Achievement: 09/26/17 Potential to Achieve Goals: Good ADL Goals Pt Will Perform Lower Body Dressing: with supervision(seated EOB with AE to don socks or thread pants) Pt Will Transfer to Toilet: with  min guard assist(with FWW to commode in restroom vs BSC d/t pain)  OT Frequency: Min 1X/week   Barriers to D/C:            Co-evaluation              AM-PAC PT "6 Clicks" Daily Activity     Outcome Measure Help from another person eating meals?: None Help from another person taking care of personal grooming?: None Help from another person toileting, which includes using toliet, bedpan, or urinal?: A Little Help from another person bathing (including washing, rinsing, drying)?: A Little Help from another person to put on and taking off regular upper body clothing?: A Little Help from another person to put on and taking off regular lower body clothing?: A Lot 6 Click Score: 19   End of Session Equipment Utilized During Treatment: Gait belt;Rolling walker Nurse Communication: Mobility status  Activity Tolerance: Patient tolerated treatment well Patient left: in bed;with call bell/phone within reach;with bed alarm set;with family/visitor present;with SCD's reapplied  OT Visit Diagnosis: Unsteadiness on feet (R26.81);Other abnormalities of gait  and mobility (R26.89);Muscle weakness (generalized) (M62.81)                Time: 1610-9604 OT Time Calculation (min): 47 min Charges:  OT General Charges $OT Visit: 1 Visit OT Evaluation $OT Eval Moderate Complexity: 1 Mod OT Treatments $[place] Care/Home Management : 23-37 mins Rejeana Brock, MS, OTR/L ascom [phone] or [phone] 09/12/17, 12:29 PM

## 2017-09-12 NOTE — Progress Notes (Signed)
Physical Therapy Treatment Patient Details Name: ANAID HANEY MRN: [id number] DOB: 1945/05/16 Today's Date: 09/12/2017    History of Present Illness Pt is 72 y/o F with MPH L cervical radiculopathy, DJD, HLD, HTN, and sizzy spells who is s/p L THR Anterior approach 09/10/17    PT Comments    Pt continues to show difficulty with motivation to perform therapy. Pt reported low pain levels (4/10 NPS) on PT arrival, but stated she did not "feel like doing therapy". Pt performed bed exercises (listed below) with increased difficulty and reported fatigue. With min assist from therapist pt was able to complete activities. Pt continues to be min assist with bed mobility and tranfers, primarily needing therapist help with initiation of movement and cueing of extremity placement. Pt expressed discontent with ambulation activity, and refused to ambulate further than 50 feet CGA due to pain increase ( 7/10 NPS). Pt returned to bed and expressed high level of fatigue. Pt was educated to attempt to walk with nursing staff when ambulating to toilet to continue to improve activity tolerance. Continue PT at this time to address above deficits and promote optimal return to PLOF. Continue to recommend transition to SNFupon discharge from acute hospitalization.  Follow Up Recommendations  SNF     Equipment Recommendations  Rolling walker with 5" wheels    Recommendations for Other Services       Precautions / Restrictions Precautions Precautions: Fall;Anterior Hip Restrictions Weight Bearing Restrictions: Yes LLE Weight Bearing: Weight bearing as tolerated    Mobility  Bed Mobility Overal bed mobility: Needs Assistance Bed Mobility: Supine to Sit;Sit to Supine     Supine to sit: Min assist Sit to supine: Min assist   General bed mobility comments: Pt uses R LE to hook and drag LLE to edge of bed but requires therapist assist at trunk to come into full sitting position.   Transfers Overall transfer  level: Needs assistance Equipment used: Rolling walker (2 wheeled) Transfers: Sit to/from Stand Sit to Stand: Min assist         General transfer comment: Pt required help with inititiating standing however was able to complete stand ind  Ambulation/Gait Ambulation/Gait assistance: Min guard Gait Distance (Feet): 50 Feet Assistive device: Rolling walker (2 wheeled)   Gait velocity: decreased   General Gait Details: Step to gait pattern, requires increased effortand time. Pt refuses to walk further due to pain ( 7/10 NPS)   Stairs             Wheelchair Mobility    Modified Rankin (Stroke Patients Only)       Balance Overall balance assessment: Independent Sitting-balance support: No upper extremity supported;Feet supported Sitting balance-Leahy Scale: Good     Standing balance support: Bilateral upper extremity supported;During functional activity Standing balance-Leahy Scale: Fair Standing balance comment: Pt is able to maintain balance when asked to take BUE off walker however only while weight is shifted to RLE                            Cognition Arousal/Alertness: Awake/alert Behavior During Therapy: WFL for tasks assessed/performed Overall Cognitive Status: Within Functional Limits for tasks assessed                                        Exercises Other Exercises Other Exercises: Supine LE therex: Heel slides AAROM  x20, quad sets x20 SAQ x 20 AAROM, ankle pumps x20. Gait training; 3550ft w/ RW min assisit (limited due to pain and pt refusing further distance, not fatigue) Other Exercises: Pt educated on importance of walking with nursing staff when needing to use restroom for practive with transfers and gait    General Comments        Pertinent Vitals/Pain Pain Assessment: 0-10 Pain Score: 4  Pain Location: L Hip Pain Descriptors / Indicators: Aching;Guarding;Sharp Pain Intervention(s): Limited activity within  patient's tolerance;Repositioned;Monitored during session    Home Living Family/patient expects to be discharged to:: Assisted living             Home Equipment: Dan HumphreysWalker - 4 wheels Additional Comments: Resident of [facility] x6 years, single-level with no steps required    Prior Function Level of Independence: Independent with assistive device(s)      Comments: Mod indep with 4WRW for ADLs, household mobilization without assist device; denies fall history.   PT Goals (current goals can now be found in the care plan section) Acute Rehab PT Goals Patient Stated Goal: To get back home PT Goal Formulation: With patient Time For Goal Achievement: 09/24/17 Potential to Achieve Goals: Good Progress towards PT goals: Progressing toward goals    Frequency    BID      PT Plan Current plan remains appropriate    Co-evaluation              AM-PAC PT "6 Clicks" Daily Activity  Outcome Measure  Difficulty turning over in bed (including adjusting bedclothes, sheets and blankets)?: Unable Difficulty moving from lying on back to sitting on the side of the bed? : Unable Difficulty sitting down on and standing up from a chair with arms (e.g., wheelchair, bedside commode, etc,.)?: Unable Help needed moving to and from a bed to chair (including a wheelchair)?: A Lot Help needed walking in hospital room?: A Little Help needed climbing 3-5 steps with a railing? : Total 6 Click Score: 9    End of Session Equipment Utilized During Treatment: Gait belt Activity Tolerance: Patient limited by pain Patient left: in bed;with bed alarm set;with call bell/phone within reach;with SCD's reapplied Nurse Communication: Mobility status(Communicated desire for nursing staff to encourage pt to walk to bathroom with nursing help) PT Visit Diagnosis: Muscle weakness (generalized) (M62.81);Difficulty in walking, not elsewhere classified (R26.2);Pain Pain - Right/Left: Left Pain - part of  body: Hip     Time: 1350-1410 PT Time Calculation (min) (ACUTE ONLY): 20 min  Charges:                       G Codes:       Grayland Jackolby Taryll Reichenberger, SPT 09/12/17,2:37 PM

## 2017-09-12 NOTE — Progress Notes (Signed)
Physical Therapy Treatment Patient Details Name: [name] MRN: [id number] DOB: 1945-06-14 Today's Date: 09/12/2017    History of Present Illness admitted for acute hospitalization status post L THR, anterior approach, WBAT (09/10/17)    PT Comments    Pt has progressed since last session. Pt pain levels are at 4/10 NPS upon PT arrival and pt reports getting sleep night prior. Pt continues to need min assist with bed mobility, transfers, and ambulation. However pt does ambulate 80 ft today with no increase in NPS. Pt ambulation distance is primarily limited by pt fear of pain. PT encouraged pt to go further and pt was able to do so by a few feet.  Pt reported no pain or fatigue with ambulation but refused to ambulate further, citing worry about being able to get back to room. Pt verbalized being happy and surprised with how far she had ambulated today, after returning to room. Pt shows promising attitude for next session and furthering ambulation distance. Will continue to benefit from skilled PT to address above deficits and promote optimal return to PLOF. Recommend transition to SNF upon discharge from acute hospitalization.  Follow Up Recommendations  SNF     Equipment Recommendations  Rolling walker with 5" wheels    Recommendations for Other Services       Precautions / Restrictions Precautions Precautions: Fall;Anterior Hip Restrictions Weight Bearing Restrictions: Yes LLE Weight Bearing: Weight bearing as tolerated    Mobility  Bed Mobility Overal bed mobility: Needs Assistance Bed Mobility: Supine to Sit;Sit to Supine     Supine to sit: Min assist Sit to supine: Min assist   General bed mobility comments: Pt uses R LE to hook and drag LLE to edge of bed but requires therapist assist at trunk to come into full sitting position.   Transfers Overall transfer level: Needs assistance Equipment used: Rolling walker (2 wheeled) Transfers: Sit to/from Stand Sit to Stand:  Min assist         General transfer comment: Pt stands with good hand placement, and pushes through BLE although RE>LE  Ambulation/Gait Ambulation/Gait assistance: Min assist Gait Distance (Feet): 80 Feet Assistive device: Rolling walker (2 wheeled)   Gait velocity: decreased   General Gait Details: Step to gait pattern, requires increased efforot and time. Pt refuses to walk further due to pain, despite reporting same NPS rating.    Stairs             Wheelchair Mobility    Modified Rankin (Stroke Patients Only)       Balance Overall balance assessment: Needs assistance Sitting-balance support: No upper extremity supported;Feet supported Sitting balance-Leahy Scale: Good     Standing balance support: Bilateral upper extremity supported Standing balance-Leahy Scale: Fair                              Cognition Arousal/Alertness: Awake/alert Behavior During Therapy: WFL for tasks assessed/performed Overall Cognitive Status: Within Functional Limits for tasks assessed                                        Exercises Other Exercises Other Exercises: Supine LE therex: Heel slides 1 x20; Seated LE therex, 1x20, AROM: ankle pumps, LAQs. Gait training; 2280ft w/ RW min assisit (limited due to fear of pain and pt refusing further distance, not fatigue)  General Comments        Pertinent Vitals/Pain Pain Assessment: 0-10 Pain Score: 4  Pain Location: L hip Pain Descriptors / Indicators: Aching;Guarding Pain Intervention(s): Limited activity within patient's tolerance;Monitored during session;Premedicated before session;Repositioned    Home Living                      Prior Function            PT Goals (current goals can now be found in the care plan section) Acute Rehab PT Goals Patient Stated Goal: to try a little PT Goal Formulation: With patient Time For Goal Achievement: 09/24/17 Potential to Achieve Goals:  Good Progress towards PT goals: Progressing toward goals    Frequency    BID      PT Plan Current plan remains appropriate    Co-evaluation              AM-PAC PT "6 Clicks" Daily Activity  Outcome Measure  Difficulty turning over in bed (including adjusting bedclothes, sheets and blankets)?: Unable Difficulty moving from lying on back to sitting on the side of the bed? : Unable Difficulty sitting down on and standing up from a chair with arms (e.g., wheelchair, bedside commode, etc,.)?: Unable Help needed moving to and from a bed to chair (including a wheelchair)?: A Lot Help needed walking in hospital room?: A Little Help needed climbing 3-5 steps with a railing? : Total 6 Click Score: 9    End of Session Equipment Utilized During Treatment: Gait belt Activity Tolerance: Patient tolerated treatment well Patient left: with call bell/phone within reach;in chair;with chair alarm set;with SCD's reapplied   PT Visit Diagnosis: Muscle weakness (generalized) (M62.81);Difficulty in walking, not elsewhere classified (R26.2);Pain Pain - Right/Left: Left Pain - part of body: Hip     Time: 0827-0850 PT Time Calculation (min) (ACUTE ONLY): 23 min  Charges:                       G Codes:       Grayland Jack, SPT 10-02-2017,10:14 AM

## 2017-09-12 NOTE — Progress Notes (Signed)
   Subjective: 2 Days Post-Op Procedure(s) (LRB): TOTAL HIP ARTHROPLASTY ANTERIOR APPROACH (Left) Patient reports pain as mild.   Patient is well, and has had no acute complaints or problems Denies any CP, SOB, ABD pain. We will continue therapy today.  Plan is to go to SNF tomororw  Objective: Vital signs in last 24 hours: Temp:  [97.4 F (36.3 C)-100 F (37.8 C)] 97.4 F (36.3 C) (07/18 0852) Pulse Rate:  [82-104] 99 (07/18 0852) Resp:  [18] 18 (07/18 0852) BP: (107-134)/(59-74) 107/74 (07/18 0852) SpO2:  [96 %-98 %] 96 % (07/18 0852)  Intake/Output from previous day: 07/17 0701 - 07/18 0700 In: 360 [P.O.:360] Out: 0  Intake/Output this shift: No intake/output data recorded.  Recent Labs    09/11/17 0441 09/12/17 0350  HGB 12.2 12.1   Recent Labs    09/11/17 0441 09/12/17 0350  WBC 14.6* 11.9*  RBC 3.61* 3.61*  HCT 35.3 35.3  PLT 217 198   Recent Labs    09/11/17 0441 09/12/17 0350  NA 137 139  K 4.2 3.5  CL 110 109  CO2 19* 22  BUN 10 11  CREATININE 0.63 0.52  GLUCOSE 134* 135*  CALCIUM 8.7* 8.9   No results for input(s): LABPT, INR in the last 72 hours.  EXAM General - Patient is Alert, Appropriate and Oriented Extremity - Neurovascular intact Sensation intact distally Intact pulses distally Dorsiflexion/Plantar flexion intact No cellulitis present Compartment soft Dressing - dressing C/D/I and no drainage, wound vac intact Motor Function - intact, moving foot and toes well on exam.   Past Medical History:  Diagnosis Date  . ALCOHOL ABUSE, HX OF   . ANEMIA-NOS   . ANXIETY   . CERVICAL RADICULOPATHY, LEFT    5 BACK OPERATIONS AND 2 NECK FUSIONS  . DEGENERATIVE DISC DISEASE, CERVICAL SPINE   . DEGENERATIVE DISC DISEASE, CERVICAL SPINE, W/RADICULOPATHY   . Depressive disorder, not elsewhere classified   . Depressive type psychosis (HCC)   . DIVERTICULITIS, HX OF    CLEARED ACCORDING TO PATIENT  . DIZZINESS   . GERD   .  HYPERLIPIDEMIA   . HYPERTENSION   . Insomnia, unspecified   . LOW BACK PAIN    RUPTURED DISC  . Memory loss   . MIGRAINE HEADACHE 09/06/2017   NOT CURRENTLY  . Palpitation    NOT A PROBLEM PER PATIENT  . WEIGHT LOSS     Assessment/Plan:   2 Days Post-Op Procedure(s) (LRB): TOTAL HIP ARTHROPLASTY ANTERIOR APPROACH (Left) Active Problems:   Status post total hip replacement, left  Estimated body mass index is 32.12 kg/m as calculated from the following:   Height as of this encounter: 5\' 6"  (1.676 m).   Weight as of this encounter: 90.3 kg (199 lb). Advance diet Up with therapy  Patient doing well.  Labs and vital signs are stable. Plan on discharge to skilled nursing facility tomorrow Needs bowel movement prior to discharge.   DVT Prophylaxis - Aspirin, TED hose and SCD Weight-Bearing as tolerated to left leg   T. Cranston Neighborhris Dream Harman, PA-C [facility] 09/12/2017, 9:10 AM

## 2017-09-12 NOTE — Discharge Summary (Addendum)
Physician Discharge Summary  Patient ID: [name] MRN: [id number] DOB/AGE: [birth year] 74 y.o.  Admit date: 09/10/2017 Discharge date: 09/13/2017 Admission Diagnoses:  primary localized osteoarthritis of left hip   Discharge Diagnoses: Patient Active Problem List   Diagnosis Date Noted  . Status post total hip replacement, left 09/10/2017  . Chest pain 11/29/2015  . Primary osteoarthritis of right hip 08/04/2015  . Preventative health care 01/15/2011  . WEIGHT LOSS 06/09/2009  . Depressive type psychosis (HCC) 10/12/2008  . CERVICAL RADICULOPATHY, LEFT 10/12/2008  . DIZZINESS 09/29/2008  . DEGENERATIVE DISC DISEASE, CERVICAL SPINE, W/RADICULOPATHY 07/21/2008  . Memory loss 01/09/2008  . Depressive disorder, not elsewhere classified 07/16/2007  . ANEMIA-NOS 01/14/2007  . HYPERTENSION 01/14/2007  . DEGENERATIVE DISC DISEASE, CERVICAL SPINE 01/14/2007  . LOW BACK PAIN 01/14/2007  . HYPERLIPIDEMIA 10/25/2006  . MIGRAINE HEADACHE 10/25/2006  . Insomnia, unspecified 10/25/2006  . ALCOHOL ABUSE, HX OF 10/25/2006  . DIVERTICULITIS, HX OF 10/25/2006  . ANXIETY 09/19/2006  . GERD 09/19/2006    Past Medical History:  Diagnosis Date  . ALCOHOL ABUSE, HX OF   . ANEMIA-NOS   . ANXIETY   . CERVICAL RADICULOPATHY, LEFT    5 BACK OPERATIONS AND 2 NECK FUSIONS  . DEGENERATIVE DISC DISEASE, CERVICAL SPINE   . DEGENERATIVE DISC DISEASE, CERVICAL SPINE, W/RADICULOPATHY   . Depressive disorder, not elsewhere classified   . Depressive type psychosis (HCC)   . DIVERTICULITIS, HX OF    CLEARED ACCORDING TO PATIENT  . DIZZINESS   . GERD   . HYPERLIPIDEMIA   . HYPERTENSION   . Insomnia, unspecified   . LOW BACK PAIN    RUPTURED DISC  . Memory loss   . MIGRAINE HEADACHE 09/06/2017   NOT CURRENTLY  . Palpitation    NOT A PROBLEM PER PATIENT  . WEIGHT LOSS      Transfusion: none   Consultants (if any):   Discharged Condition: Improved  Hospital Course: [name] is an 72  y.o. female who was admitted 09/10/2017 with a diagnosis of left hip osteoarthritis and went to the operating room on 09/10/2017 and underwent the above named procedures.    Surgeries: Procedure(s): TOTAL HIP ARTHROPLASTY ANTERIOR APPROACH on 09/10/2017 Patient tolerated the surgery well. Taken to PACU where she was stabilized and then transferred to the orthopedic floor.  Started on aspirin 325 mg daily.  SCDs bilaterally. Heels elevated on bed with rolled towels. No evidence of DVT. Negative Homan. Physical therapy started on day #1 for gait training and transfer. OT started day #1 for ADL and assisted devices.  Patient's foley was d/c on day #1. Patient's IV was d/c on day #2.  On post op day #3 patient was stable and ready for discharge to skilled nursing facility.    Implants:  Medacta 2 std AMIS stem, 46 mm Mpact DM cup with liner, S 22.2 mm metal head     Please remove provena negative pressure dressing on 09/20/2017 and apply honey comb dressing. Keep dressing clean and dry at all times.   She was given perioperative antibiotics:  Anti-infectives (From admission, onward)   Start     Dose/Rate Route Frequency Ordered Stop   09/10/17 1600  ceFAZolin (ANCEF) IVPB 2g/100 mL premix     2 g 200 mL/hr over 30 Minutes Intravenous Every 6 hours 09/10/17 1325 09/11/17 0649   09/10/17 0757  ceFAZolin (ANCEF) 2-4 GM/100ML-% IVPB    Note to Pharmacy:  Lorrene Reid   : cabinet  override      09/10/17 0757 09/10/17 0957   09/09/17 2215  ceFAZolin (ANCEF) IVPB 2g/100 mL premix     2 g 200 mL/hr over 30 Minutes Intravenous  Once 09/09/17 2213 09/10/17 0957    .  She was given sequential compression devices, early ambulation, and aspirin 325 mg daily for DVT prophylaxis.  She benefited maximally from the hospital stay and there were no complications.    Recent vital signs:  Vitals:   09/12/17 0024 09/12/17 0852  BP: 134/68 107/74  Pulse: (!) 104 99  Resp:  18  Temp: 100 F (37.8  C) (!) 97.4 F (36.3 C)  SpO2: 98% 96%    Recent laboratory studies:  Lab Results  Component Value Date   HGB 12.1 09/12/2017   HGB 12.2 09/11/2017   HGB 14.4 09/06/2017   Lab Results  Component Value Date   WBC 11.9 (H) 09/12/2017   PLT 198 09/12/2017   Lab Results  Component Value Date   INR 1.01 09/06/2017   Lab Results  Component Value Date   NA 139 09/12/2017   K 3.5 09/12/2017   CL 109 09/12/2017   CO2 22 09/12/2017   BUN 11 09/12/2017   CREATININE 0.52 09/12/2017   GLUCOSE 135 (H) 09/12/2017    Discharge Medications:   Allergies as of 09/12/2017      Reactions   Atorvastatin Other (See Comments)   "unknown"   Aspirin Other (See Comments)   Reaction: stomach ache   Codeine Itching   Morphine Other (See Comments)   "severe headache"   Nsaids    STOMACH PAINS      Medication List    STOP taking these medications   acetaminophen 500 MG tablet Commonly known as:  TYLENOL   ibuprofen 600 MG tablet Commonly known as:  ADVIL,MOTRIN   traMADol-acetaminophen 37.5-325 MG tablet Commonly known as:  ULTRACET     TAKE these medications   albuterol 108 (90 Base) MCG/ACT inhaler Commonly known as:  PROVENTIL HFA;VENTOLIN HFA Inhale 2 puffs into the lungs 4 (four) times daily as needed for wheezing or shortness of breath.   ARICEPT 10 MG tablet Generic drug:  donepezil Take 10 mg by mouth at bedtime.   ARTHRICREAM 10 % cream Generic drug:  trolamine salicylate Apply 1 application topically 2 (two) times daily as needed (shoulder and/or hip pain).   ASPERCREME LIDOCAINE 4 % Ptch Generic drug:  Lidocaine Apply 1 patch topically every 12 (twelve) hours as needed (pain).   aspirin 325 MG EC tablet Take 1 tablet (325 mg total) by mouth daily with breakfast for 15 days. Start taking on:  09/13/2017   [facility] VANISHING SCENT 2.5 % Gel Generic drug:  Menthol (Topical Analgesic) Apply 1 application topically at bedtime as needed (muscle aches).    buPROPion 300 MG 24 hr tablet Commonly known as:  WELLBUTRIN XL Take 300 mg by mouth daily.   busPIRone 5 MG tablet Commonly known as:  BUSPAR Take 5 mg by mouth 2 (two) times daily.   CEPACOL SORE THROAT 15-2.6 MG Lozg Generic drug:  Benzocaine-Menthol Use as directed 1 lozenge in the mouth or throat every hour as needed (sore throat).   diclofenac sodium 1 % Gel Commonly known as:  VOLTAREN Apply 2 g topically 2 (two) times daily as needed (hip pain).   dicyclomine 20 MG tablet Commonly known as:  BENTYL Take 10-20 mg by mouth See admin instructions. Take 10 mg by mouth at 0600 and  1300, then take 20 mgs at bedtime (2100)   docusate sodium 100 MG capsule Commonly known as:  COLACE Take 1 capsule (100 mg total) by mouth 2 (two) times daily.   eszopiclone 3 MG Tabs Generic drug:  Eszopiclone Take 3 mg by mouth at bedtime. Take immediately before bedtime   guaifenesin 100 MG/5ML syrup Commonly known as:  ROBITUSSIN Take 10 mLs by mouth every 6 (six) hours as needed for cough. Not to exceed 4 doses in 24 hours   HYDROcodone-acetaminophen 5-325 MG tablet Commonly known as:  NORCO/VICODIN Take 1-2 tablets by mouth every 4 (four) hours as needed for moderate pain (pain score 4-6). What changed:    how much to take  when to take this  reasons to take this   lisinopril 20 MG tablet Commonly known as:  PRINIVIL,ZESTRIL Take 20 mg by mouth daily.   loperamide 2 MG capsule Commonly known as:  IMODIUM Take 2 mg by mouth as needed for diarrhea or loose stools.   Melatonin 5 MG Tabs Take 5 mg by mouth at bedtime.   MINTOX 200-200-20 MG/5ML suspension Generic drug:  alum & mag hydroxide-simeth Take 30 mLs by mouth 4 (four) times daily as needed for indigestion or heartburn.   alum & mag hydroxide-simeth 400-400-40 MG/5ML suspension Commonly known as:  MAALOX PLUS Take 15 mLs by mouth every 6 (six) hours as needed for indigestion.   omeprazole 40 MG capsule Commonly  known as:  PRILOSEC Take 40 mg by mouth daily.   pregabalin 75 MG capsule Commonly known as:  LYRICA Take 75 mg by mouth 2 (two) times daily.   ranitidine 150 MG tablet Commonly known as:  ZANTAC Take 150 mg by mouth 2 (two) times daily.   senna 8.6 MG Tabs tablet Commonly known as:  SENOKOT Take 1 tablet by mouth daily.   sertraline 50 MG tablet Commonly known as:  ZOLOFT Take 75 mg by mouth every evening.   simvastatin 40 MG tablet Commonly known as:  ZOCOR Take 40 mg by mouth at bedtime.   TRIPLE ANTIBIOTIC 3.5-9137206473 Oint Apply 1 application topically as needed. For minor skin tears or abrasion. Clean area with normal saline, apply ointment or equivalent, cover with bandaid or gauze and tape and change as needed until healed.   vitamin C 500 MG tablet Commonly known as:  ASCORBIC ACID Take 1,000 mg by mouth daily.   zinc gluconate 50 MG tablet Take 50 mg by mouth daily.       Diagnostic Studies: Mr Pelvis Wo Contrast  Result Date: 08/14/2017 CLINICAL DATA:  Progressive posterior left-sided hip pain for 3 months. Previous right total hip replacement. EXAM: MRI PELVIS WITHOUT CONTRAST TECHNIQUE: Multiplanar multisequence MR imaging of the pelvis was performed. No intravenous contrast was administered. COMPARISON:  MRI dated 05/23/2015 FINDINGS: Musculoskeletal: There is thinning of the articular cartilage in the superior aspect of the left hip joint. No significant bone abnormality of the left hip. Right total hip prosthesis obscures detail at the right hip. SI joints appear normal. Muscles of the pelvis and hips appear bilaterally symmetrical and normal. No joint effusions or bursitis. Urinary Tract:  Normal. Bowel:  Normal. Vascular/Lymphatic: No pathologically enlarged lymph nodes. No significant vascular abnormality seen. Reproductive:  No mass or other significant abnormality Other: Postsurgical changes in the soft tissues of the lower lumbar spine with evidence of  previous lower lumbar fusion. IMPRESSION: 1. No acute abnormalities. 2. Slight degenerative arthritic changes of the left hip joint. 3.  Postsurgical changes in the right hip and in the lower lumbar spine as described. Electronically Signed   By: Francene Boyers M.D.   On: 08/14/2017 14:27   Dg Hip Operative Unilat W Or W/o Pelvis Left  Result Date: 09/10/2017 CLINICAL DATA:  Left anterior hip replacement EXAM: OPERATIVE left HIP (WITH PELVIS IF PERFORMED) 3 VIEWS TECHNIQUE: Fluoroscopic spot image(s) were submitted for interpretation post-operatively. COMPARISON:  MR left hip of 05/19/2005 FINDINGS: Three C-arm spot films show placement of left anterior hip replacement. No complicating features are seen on the limited field of view images. IMPRESSION: Left anterior hip replacement components in good position on the limited views obtained. Electronically Signed   By: Dwyane Dee M.D.   On: 09/10/2017 11:05   Dg Hip Unilat W Or W/o Pelvis 2-3 Views Left  Result Date: 09/10/2017 CLINICAL DATA:  Left hip replacement. EXAM: DG HIP (WITH OR WITHOUT PELVIS) 2-3V LEFT COMPARISON:  No recent prior. FINDINGS: Total left hip replacement. Hardware intact. Anatomic alignment. No acute bony abnormality. IMPRESSION: Total left hip replacement with anatomic alignment. Electronically Signed   By: Maisie Fus  Register   On: 09/10/2017 12:41    Disposition:      Contact information for follow-up providers    Evon Slack, PA-C Follow up in 2 week(s).   Specialties:  Orthopedic Surgery, Emergency Medicine Contact information: [address] [place] [address] [phone]            Contact information for after-discharge care    Destination    [facility] .   Service:  Skilled Nursing Contact information: [address] [place] [address] [phone]                   Signed: Patience Musca 09/12/2017, 9:17 AM

## 2017-09-12 NOTE — Discharge Instructions (Signed)
°ANTERIOR APPROACH TOTAL HIP REPLACEMENT POSTOPERATIVE DIRECTIONS ° ° °Hip Rehabilitation, Guidelines Following Surgery  °The results of a hip operation are greatly improved after range of motion and muscle strengthening exercises. Follow all safety measures which are given to protect your hip. If any of these exercises cause increased pain or swelling in your joint, decrease the amount until you are comfortable again. Then slowly increase the exercises. Call your caregiver if you have problems or questions.  ° °HOME CARE INSTRUCTIONS  °Remove items at home which could result in a fall. This includes throw rugs or furniture in walking pathways.  °· ICE to the affected hip every three hours for 30 minutes at a time and then as needed for pain and swelling.  Continue to use ice on the hip for pain and swelling from surgery. You may notice swelling that will progress down to the foot and ankle.  This is normal after surgery.  Elevate the leg when you are not up walking on it.   °· Continue to use the breathing machine which will help keep your temperature down.  It is common for your temperature to cycle up and down following surgery, especially at night when you are not up moving around and exerting yourself.  The breathing machine keeps your lungs expanded and your temperature down. °· Do not place pillow under knee, focus on keeping the knee straight while resting ° °DIET °You may resume your previous home diet once your are discharged from the hospital. ° °DRESSING / WOUND CARE / SHOWERING °Please remove provena negative pressure dressing on 09/20/2017 and apply honey comb dressing. Keep dressing clean and dry at all times. ° °ACTIVITY °Walk with your walker as instructed. °Use walker as long as suggested by your caregivers. °Avoid periods of inactivity such as sitting longer than an hour when not asleep. This helps prevent blood clots.  °You may resume a sexual relationship in one month or when given the OK by  your doctor.  °You may return to work once you are cleared by your doctor.  °Do not drive a car for 6 weeks or until released by you surgeon.  °Do not drive while taking narcotics. ° °WEIGHT BEARING °Weight bearing as tolerated. Use walker/cane as needed for at least 4 weeks post op. ° °POSTOPERATIVE CONSTIPATION PROTOCOL °Constipation - defined medically as fewer than three stools per week and severe constipation as less than one stool per week. ° °One of the most common issues patients have following surgery is constipation.  Even if you have a regular bowel pattern at home, your normal regimen is likely to be disrupted due to multiple reasons following surgery.  Combination of anesthesia, postoperative narcotics, change in appetite and fluid intake all can affect your bowels.  In order to avoid complications following surgery, here are some recommendations in order to help you during your recovery period. ° °Colace (docusate) - Pick up an over-the-counter form of Colace or another stool softener and take twice a day as long as you are requiring postoperative pain medications.  Take with a full glass of water daily.  If you experience loose stools or diarrhea, hold the colace until you stool forms back up.  If your symptoms do not get better within 1 week or if they get worse, check with your doctor. ° °Dulcolax (bisacodyl) - Pick up over-the-counter and take as directed by the product packaging as needed to assist with the movement of your bowels.  Take with a full   glass of water.  Use this product as needed if not relieved by Colace only.  ° °MiraLax (polyethylene glycol) - Pick up over-the-counter to have on hand.  MiraLax is a solution that will increase the amount of water in your bowels to assist with bowel movements.  Take as directed and can mix with a glass of water, juice, soda, coffee, or tea.  Take if you go more than two days without a movement. °Do not use MiraLax more than once per day. Call your  doctor if you are still constipated or irregular after using this medication for 7 days in a row. ° °If you continue to have problems with postoperative constipation, please contact the office for further assistance and recommendations.  If you experience "the worst abdominal pain ever" or develop nausea or vomiting, please contact the office immediatly for further recommendations for treatment. ° °ITCHING ° If you experience itching with your medications, try taking only a single pain pill, or even half a pain pill at a time.  You can also use Benadryl over the counter for itching or also to help with sleep.  ° °TED HOSE STOCKINGS °Wear the elastic stockings on both legs for six weeks following surgery during the day but you may remove then at night for sleeping. ° °MEDICATIONS °See your medication summary on the “After Visit Summary” that the nursing staff will review with you prior to discharge.  You may have some home medications which will be placed on hold until you complete the course of blood thinner medication.  It is important for you to complete the blood thinner medication as prescribed by your surgeon.  Continue your approved medications as instructed at time of discharge. ° °PRECAUTIONS °If you experience chest pain or shortness of breath - call 911 immediately for transfer to the hospital emergency department.  °If you develop a fever greater that 101 F, purulent drainage from wound, increased redness or drainage from wound, foul odor from the wound/dressing, or calf pain - CONTACT YOUR SURGEON.   °                                                °FOLLOW-UP APPOINTMENTS °Make sure you keep all of your appointments after your operation with your surgeon and caregivers. You should call the office at the above phone number and make an appointment for approximately two weeks after the date of your surgery or on the date instructed by your surgeon outlined in the "After Visit Summary". ° °RANGE OF MOTION  AND STRENGTHENING EXERCISES  °These exercises are designed to help you keep full movement of your hip joint. Follow your caregiver's or physical therapist's instructions. Perform all exercises about fifteen times, three times per day or as directed. Exercise both hips, even if you have had only one joint replacement. These exercises can be done on a training (exercise) mat, on the floor, on a table or on a bed. Use whatever works the best and is most comfortable for you. Use music or television while you are exercising so that the exercises are a pleasant break in your day. This will make your life better with the exercises acting as a break in routine you can look forward to.  °Lying on your back, slowly slide your foot toward your buttocks, raising your knee up off the floor. Then slowly   slide your foot back down until your leg is straight again.  °Lying on your back spread your legs as far apart as you can without causing discomfort.  °Lying on your side, raise your upper leg and foot straight up from the floor as far as is comfortable. Slowly lower the leg and repeat.  °Lying on your back, tighten up the muscle in the front of your thigh (quadriceps muscles). You can do this by keeping your leg straight and trying to raise your heel off the floor. This helps strengthen the largest muscle supporting your knee.  °Lying on your back, tighten up the muscles of your buttocks both with the legs straight and with the knee bent at a comfortable angle while keeping your heel on the floor.  ° °IF YOU ARE TRANSFERRED TO A SKILLED REHAB FACILITY °If the patient is transferred to a skilled rehab facility following release from the hospital, a list of the current medications will be sent to the facility for the patient to continue.  When discharged from the skilled rehab facility, please have the facility set up the patient's Home Health Physical Therapy prior to being released. Also, the skilled facility will be responsible  for providing the patient with their medications at time of release from the facility to include their pain medication, the muscle relaxants, and their blood thinner medication. If the patient is still at the rehab facility at time of the two week follow up appointment, the skilled rehab facility will also need to assist the patient in arranging follow up appointment in our office and any transportation needs. ° °MAKE SURE YOU:  °Understand these instructions.  °Get help right away if you are not doing well or get worse.  ° ° °Pick up stool softner and laxative for home use following surgery while on pain medications. °Continue to use ice for pain and swelling after surgery. °Do not use any lotions or creams on the incision until instructed by your surgeon. ° ° °

## 2017-09-13 MED ORDER — BISACODYL 10 MG RE SUPP
10.0000 mg | Freq: Once | RECTAL | Status: AC
Start: 2017-09-13 — End: 2017-09-13
  Administered 2017-09-13: 10 mg via RECTAL
  Filled 2017-09-13: qty 1

## 2017-09-13 NOTE — Progress Notes (Signed)
[facility], [place] to [place]. Patient bathed, IV removed and belongings packed. Called EMS for transport.

## 2017-09-13 NOTE — Progress Notes (Signed)
Physical Therapy Treatment Patient Details Name: Laurie LollLinda S Schlink MRN: [id number] DOB: June 18, 1945 Today's Date: 09/13/2017    History of Present Illness Pt is 72 y/o F with MPH L cervical radiculopathy, DJD, HLD, HTN, and sizzy spells who is s/p L THR Anterior approach 09/10/17    PT Comments    Pt tolerance and attitude to therapy is much improved today. Pt was able to perform bed therapeutic exercises actively without therapist assist, albeit with high level of difficulty and decreased speed. Pt was receptive to gait training today and was able to ambulate significantly further than previous days (150 ft). Pt showed improved weight shifting to LLE during gait. Still exhibts decreased stance phase on LLE and difficulty with toe off on LLE. Pt denies any increase in pain with all therapy activities. Pt is progressing well towards therapy goals and will continue to benefit from PT until D/C from acute hospitalization to address above deficits and return pt to PLOF. Recommend transition to SNF upon discharge from acute hospitalization.  Follow Up Recommendations  SNF     Equipment Recommendations  Rolling walker with 5" wheels    Recommendations for Other Services       Precautions / Restrictions Precautions Precautions: Fall;Anterior Hip Restrictions Weight Bearing Restrictions: Yes LLE Weight Bearing: Weight bearing as tolerated    Mobility  Bed Mobility Overal bed mobility: Modified Independent Bed Mobility: Supine to Sit     Supine to sit: Modified independent (Device/Increase time)     General bed mobility comments: Pt uses R LE to hook and drag LLE to edge of bed, uses hand rails for help pulling trunk upright, and requires increased time and effort  Transfers Overall transfer level: Modified independent Equipment used: Rolling walker (2 wheeled) Transfers: Sit to/from Stand Sit to Stand: Modified independent (Device/Increase time)         General transfer comment: Pt  able to complete sit to stand with help of RW and with incrased time and effort, but no therpaist assistance was provided  Ambulation/Gait Ambulation/Gait assistance: Min guard Gait Distance (Feet): 150 Feet Assistive device: Rolling walker (2 wheeled)   Gait velocity: decreased   General Gait Details: Step to gait pattern, requires one restr break at roughly 80 feet. Pt denies any increase in pain with activity (4/10 NPS)   Stairs             Wheelchair Mobility    Modified Rankin (Stroke Patients Only)       Balance Overall balance assessment: Independent Sitting-balance support: No upper extremity supported;Feet supported Sitting balance-Leahy Scale: Good     Standing balance support: Bilateral upper extremity supported;During functional activity Standing balance-Leahy Scale: Good Standing balance comment: Pt is able to maintain balance when asked to take BUE off walker however only while weight is shifted to RLE                            Cognition Arousal/Alertness: Awake/alert Behavior During Therapy: WFL for tasks assessed/performed Overall Cognitive Status: Within Functional Limits for tasks assessed                                        Exercises Other Exercises Other Exercises: Supine LE therex: Heel slides AROM x10, quad sets x20, ankle pumps x20. Gait training; 14450ft w/ RW CGA    General Comments  Pertinent Vitals/Pain Pain Assessment: 0-10 Pain Score: 4  Pain Location: L Hip Pain Descriptors / Indicators: Aching;Guarding;Sharp    Home Living                      Prior Function            PT Goals (current goals can now be found in the care plan section) Acute Rehab PT Goals Patient Stated Goal: To get back home PT Goal Formulation: With patient Time For Goal Achievement: 09/24/17 Potential to Achieve Goals: Good Progress towards PT goals: Progressing toward goals    Frequency     BID      PT Plan Current plan remains appropriate    Co-evaluation              AM-PAC PT "6 Clicks" Daily Activity  Outcome Measure  Difficulty turning over in bed (including adjusting bedclothes, sheets and blankets)?: A Lot Difficulty moving from lying on back to sitting on the side of the bed? : A Lot Difficulty sitting down on and standing up from a chair with arms (e.g., wheelchair, bedside commode, etc,.)?: A Lot Help needed moving to and from a bed to chair (including a wheelchair)?: A Lot Help needed walking in hospital room?: A Little Help needed climbing 3-5 steps with a railing? : A Lot 6 Click Score: 13    End of Session Equipment Utilized During Treatment: Gait belt Activity Tolerance: Patient tolerated treatment well Patient left: in chair;with chair alarm set;with SCD's reapplied;with call bell/phone within reach   PT Visit Diagnosis: Muscle weakness (generalized) (M62.81);Difficulty in walking, not elsewhere classified (R26.2);Pain Pain - Right/Left: Left Pain - part of body: Hip     Time: 2956-2130 PT Time Calculation (min) (ACUTE ONLY): 20 min  Charges:                       G Codes:       Grayland Jack, SPT 10-03-17,9:32 AM

## 2017-09-13 NOTE — Progress Notes (Signed)
EMS here to transport to [facility] 

## 2017-09-13 NOTE — Clinical Social Work Placement (Addendum)
   CLINICAL SOCIAL WORK PLACEMENT  NOTE  Date:  09/13/2017  Patient Details  Name: [name] MRN: [id number] Date of Birth: [birth year]  Clinical Social Work is seeking post-discharge placement for this patient at the Skilled  Nursing Facility level of care (*CSW will initial, date and re-position this form in  chart as items are completed):  Yes   Patient/family provided with [facility] Clinical Social Work Department's list of facilities offering this level of care within the geographic area requested by the patient (or if unable, by the patient's family).  Yes   Patient/family informed of their freedom to choose among providers that offer the needed level of care, that participate in Medicare, Medicaid or managed care program needed by the patient, have an available bed and are willing to accept the patient.  Yes   Patient/family informed of [facility]'s ownership interest [facility] and [facility], as well as of the fact that they are under no obligation to receive care at these facilities.  PASRR submitted to EDS on 09/10/17     PASRR number received on 09/11/17     Existing PASRR number confirmed on       FL2 transmitted to all facilities in geographic area requested by pt/family on 09/10/17     FL2 transmitted to all facilities within larger geographic area on       Patient informed that his/her managed care company has contracts with or will negotiate with certain facilities, including the following:        Yes   Patient/family informed of bed offers received.  Patient chooses bed at [facility] )     Physician recommends and patient chooses bed at      Patient to be transferred to [facility] ) on 09/13/17.  Patient to be transferred to facility by [facility] )     Patient family notified on 09/13/17 of transfer.  Name of family member notified:  (CSW left patient's daughter Jasmine DecemberSharon a Engineer, technical salesvoicemail. ) Patient's daughter Jasmine DecemberSharon called  CSW back and is aware of D/C today.    PHYSICIAN       Additional Comment:    _______________________________________________ Bellarae Lizer, Darleen CrockerBailey M, LCSW 09/13/2017, 11:14 AM

## 2017-09-13 NOTE — Progress Notes (Addendum)
Patient is medically stable for D/C to [facility] today. [facility] patient can come today to room 404. RN will call report and arrange EMS for transport. Clinical Child psychotherapistocial Worker (CSW) sent D/C orders to [facility] via [facility]. Patient is aware of above. CSW left patient's daughter Jasmine DecemberSharon a Engineer, technical salesvoicemail. Please reconsult if future social work needs arise. CSW signing off.  Patient's daughter Jasmine DecemberSharon called CSW back and was made aware of above.    Baker Hughes IncorporatedBailey Chinonso Linker, LCSW [phone]

## 2017-09-13 NOTE — Plan of Care (Signed)
  Problem: Activity: Goal: Risk for activity intolerance will decrease Outcome: Progressing   Problem: Nutrition: Goal: Adequate nutrition will be maintained Outcome: Progressing   Problem: Coping: Goal: Level of anxiety will decrease Outcome: Progressing   Problem: Elimination: Goal: Will not experience complications related to bowel motility Outcome: Progressing   Problem: Pain Managment: Goal: General experience of comfort will improve Outcome: Progressing   Problem: Skin Integrity: Goal: Risk for impaired skin integrity will decrease Outcome: Progressing   Problem: Pain Management: Goal: Pain level will decrease with appropriate interventions Outcome: Progressing   Problem: Skin Integrity: Goal: Will show signs of wound healing Outcome: Progressing

## 2017-09-13 NOTE — Care Management (Signed)
Updated [facility] that patient to discharge to skilled nursing- [facility]

## 2017-09-13 NOTE — Care Management Important Message (Signed)
Important Message  Patient Details  Name: Milagros LollLinda S Hagins MRN: [id number] Date of Birth: [birth year]   Medicare Important Message Given:  Yes    Olegario MessierKathy A Scherry Laverne 09/13/2017, 10:17 AM

## 2017-09-13 NOTE — Progress Notes (Signed)
   Subjective: 3 Days Post-Op Procedure(s) (LRB): TOTAL HIP ARTHROPLASTY ANTERIOR APPROACH (Left) Patient reports pain as mild.   Patient is well, and has had no acute complaints or problems Denies any CP, SOB, ABD pain. We will continue therapy today.  Plan is to go to SNF today  Objective: Vital signs in last 24 hours: Temp:  [97.4 F (36.3 C)-98.7 F (37.1 C)] 98.3 F (36.8 C) (07/19 0726) Pulse Rate:  [88-102] 88 (07/19 0726) Resp:  [18-20] 18 (07/19 0726) BP: (107-122)/(69-74) 107/73 (07/19 0726) SpO2:  [93 %-96 %] 95 % (07/19 0726)  Intake/Output from previous day: 07/18 0701 - 07/19 0700 In: 960 [P.O.:960] Out: -  Intake/Output this shift: No intake/output data recorded.  Recent Labs    09/11/17 0441 09/12/17 0350  HGB 12.2 12.1   Recent Labs    09/11/17 0441 09/12/17 0350  WBC 14.6* 11.9*  RBC 3.61* 3.61*  HCT 35.3 35.3  PLT 217 198   Recent Labs    09/11/17 0441 09/12/17 0350  NA 137 139  K 4.2 3.5  CL 110 109  CO2 19* 22  BUN 10 11  CREATININE 0.63 0.52  GLUCOSE 134* 135*  CALCIUM 8.7* 8.9   No results for input(s): LABPT, INR in the last 72 hours.  EXAM General - Patient is Alert, Appropriate and Oriented Extremity - Neurovascular intact Sensation intact distally Intact pulses distally Dorsiflexion/Plantar flexion intact No cellulitis present Compartment soft Dressing - dressing C/D/I and no drainage, wound vac intact Motor Function - intact, moving foot and toes well on exam.   Past Medical History:  Diagnosis Date  . ALCOHOL ABUSE, HX OF   . ANEMIA-NOS   . ANXIETY   . CERVICAL RADICULOPATHY, LEFT    5 BACK OPERATIONS AND 2 NECK FUSIONS  . DEGENERATIVE DISC DISEASE, CERVICAL SPINE   . DEGENERATIVE DISC DISEASE, CERVICAL SPINE, W/RADICULOPATHY   . Depressive disorder, not elsewhere classified   . Depressive type psychosis (HCC)   . DIVERTICULITIS, HX OF    CLEARED ACCORDING TO PATIENT  . DIZZINESS   . GERD   .  HYPERLIPIDEMIA   . HYPERTENSION   . Insomnia, unspecified   . LOW BACK PAIN    RUPTURED DISC  . Memory loss   . MIGRAINE HEADACHE 09/06/2017   NOT CURRENTLY  . Palpitation    NOT A PROBLEM PER PATIENT  . WEIGHT LOSS     Assessment/Plan:   3 Days Post-Op Procedure(s) (LRB): TOTAL HIP ARTHROPLASTY ANTERIOR APPROACH (Left) Active Problems:   Status post total hip replacement, left  Estimated body mass index is 32.12 kg/m as calculated from the following:   Height as of this encounter: 5\' 6"  (1.676 m).   Weight as of this encounter: 90.3 kg (199 lb). Advance diet Up with therapy  Patient doing well.  Labs and vital signs are stable. Plan on discharge to skilled nursing facility today pending BM    DVT Prophylaxis - Aspirin, TED hose and SCD Weight-Bearing as tolerated to left leg   T. Cranston Neighborhris Artis Buechele, PA-C [facility] 09/13/2017, 8:04 AM

## 2017-10-20 ENCOUNTER — Emergency Department: Payer: Medicare Other

## 2017-10-20 ENCOUNTER — Emergency Department
Admission: EM | Admit: 2017-10-20 | Discharge: 2017-10-20 | Disposition: A | Discharge status: Home / Self Care | Payer: Medicare Other | Attending: Emergency Medicine | Admitting: Emergency Medicine

## 2017-10-20 ENCOUNTER — Encounter: Payer: Self-pay | Admitting: Emergency Medicine

## 2017-10-20 DIAGNOSIS — Z79899 Other long term (current) drug therapy: Secondary | ICD-10-CM | POA: Diagnosis not present

## 2017-10-20 DIAGNOSIS — Z96643 Presence of artificial hip joint, bilateral: Secondary | ICD-10-CM | POA: Insufficient documentation

## 2017-10-20 DIAGNOSIS — F17210 Nicotine dependence, cigarettes, uncomplicated: Secondary | ICD-10-CM | POA: Insufficient documentation

## 2017-10-20 DIAGNOSIS — G8918 Other acute postprocedural pain: Secondary | ICD-10-CM

## 2017-10-20 DIAGNOSIS — M25552 Pain in left hip: Secondary | ICD-10-CM | POA: Diagnosis present

## 2017-10-20 LAB — CBC WITH DIFFERENTIAL/PLATELET
Basophils Absolute: 0.1 10*3/uL (ref 0–0.1)
Basophils Relative: 1 %
EOS ABS: 0.1 10*3/uL (ref 0–0.7)
Eosinophils Relative: 1 %
HEMATOCRIT: 38.1 % (ref 35.0–47.0)
HEMOGLOBIN: 13.3 g/dL (ref 12.0–16.0)
LYMPHS ABS: 3 10*3/uL (ref 1.0–3.6)
LYMPHS PCT: 38 %
MCH: 33.4 pg (ref 26.0–34.0)
MCHC: 34.8 g/dL (ref 32.0–36.0)
MCV: 96 fL (ref 80.0–100.0)
Monocytes Absolute: 0.4 10*3/uL (ref 0.2–0.9)
Monocytes Relative: 5 %
NEUTROS ABS: 4.4 10*3/uL (ref 1.4–6.5)
NEUTROS PCT: 55 %
Platelets: 230 10*3/uL (ref 150–440)
RBC: 3.97 MIL/uL (ref 3.80–5.20)
RDW: 13.2 % (ref 11.5–14.5)
WBC: 8 10*3/uL (ref 3.6–11.0)

## 2017-10-20 LAB — BASIC METABOLIC PANEL
Anion gap: 8 (ref 5–15)
BUN: 11 mg/dL (ref 8–23)
CHLORIDE: 106 mmol/L (ref 98–111)
CO2: 25 mmol/L (ref 22–32)
Calcium: 9.4 mg/dL (ref 8.9–10.3)
Creatinine, Ser: 0.7 mg/dL (ref 0.44–1.00)
GFR calc Af Amer: >60 mL/min (ref >60)
GFR calc non Af Amer: >60 mL/min (ref >60)
Glucose, Bld: 103 mg/dL — ABNORMAL HIGH (ref 70–99)
POTASSIUM: 4.2 mmol/L (ref 3.5–5.1)
SODIUM: 139 mmol/L (ref 135–145)

## 2017-10-20 MED ORDER — ONDANSETRON HCL 4 MG/2ML IJ SOLN
4.0000 mg | Freq: Once | INTRAMUSCULAR | Status: AC
  Administered 2017-10-20: 4 mg via INTRAVENOUS

## 2017-10-20 MED ORDER — ONDANSETRON HCL 4 MG/2ML IJ SOLN
INTRAMUSCULAR | Status: AC
  Administered 2017-10-20: 4 mg via INTRAVENOUS
  Filled 2017-10-20: qty 2

## 2017-10-20 MED ORDER — FENTANYL CITRATE (PF) 100 MCG/2ML IJ SOLN
50.0000 ug | Freq: Once | INTRAMUSCULAR | Status: AC
  Administered 2017-10-20: 50 ug via INTRAVENOUS
  Filled 2017-10-20: qty 2

## 2017-10-20 NOTE — ED Notes (Signed)
Patient transported to X-ray 

## 2017-10-20 NOTE — ED Provider Notes (Addendum)
[facility] Emergency Department Provider Note  ____________________________________________   I have reviewed the triage vital signs and the nursing notes. Where available I have reviewed prior notes and, if possible and indicated, outside hospital notes.    HISTORY  Chief Complaint Hip Pain    HPI [name] is a 72 y.o. female about 6 weeks out from a hip replacement on the left, she states her pain is been going okay until she had started physical therapy about a week or 9 days ago and at that time she began to have increased pain in the muscles around that thigh.  She is able to ambulate, pain is really in the back region.  The hip itself does not seem to be tender, she thinks that she overdid it with using her muscles with physical therapy and since that time she is had pain.  She is able to ambulate.  She has not had any fevers, no redness no pain going down the leg no incontinence of bowel or bladder no fall no trauma, she does take Vicodin as needed for this pain but it is persisting.  She also took a Tylenol this morning.  She is not sure how often she gets Vicodin because she lives at a rehab facility at this time.  She denies any other complaint.  This pain is been there is since the surgery but it got worse during physical therapy      Past Medical History:  Diagnosis Date  . ALCOHOL ABUSE, HX OF   . ANEMIA-NOS   . ANXIETY   . CERVICAL RADICULOPATHY, LEFT    5 BACK OPERATIONS AND 2 NECK FUSIONS  . DEGENERATIVE DISC DISEASE, CERVICAL SPINE   . DEGENERATIVE DISC DISEASE, CERVICAL SPINE, W/RADICULOPATHY   . Depressive disorder, not elsewhere classified   . Depressive type psychosis (HCC)   . DIVERTICULITIS, HX OF    CLEARED ACCORDING TO PATIENT  . DIZZINESS   . GERD   . HYPERLIPIDEMIA   . HYPERTENSION   . Insomnia, unspecified   . LOW BACK PAIN    RUPTURED DISC  . Memory loss   . MIGRAINE HEADACHE 09/06/2017   NOT CURRENTLY  .  Palpitation    NOT A PROBLEM PER PATIENT  . WEIGHT LOSS     Patient Active Problem List   Diagnosis Date Noted  . Status post total hip replacement, left 09/10/2017  . Chest pain 11/29/2015  . Primary osteoarthritis of right hip 08/04/2015  . Preventative health care 01/15/2011  . WEIGHT LOSS 06/09/2009  . Depressive type psychosis (HCC) 10/12/2008  . CERVICAL RADICULOPATHY, LEFT 10/12/2008  . DIZZINESS 09/29/2008  . DEGENERATIVE DISC DISEASE, CERVICAL SPINE, W/RADICULOPATHY 07/21/2008  . Memory loss 01/09/2008  . Depressive disorder, not elsewhere classified 07/16/2007  . ANEMIA-NOS 01/14/2007  . HYPERTENSION 01/14/2007  . DEGENERATIVE DISC DISEASE, CERVICAL SPINE 01/14/2007  . LOW BACK PAIN 01/14/2007  . HYPERLIPIDEMIA 10/25/2006  . MIGRAINE HEADACHE 10/25/2006  . Insomnia, unspecified 10/25/2006  . ALCOHOL ABUSE, HX OF 10/25/2006  . DIVERTICULITIS, HX OF 10/25/2006  . ANXIETY 09/19/2006  . GERD 09/19/2006    Past Surgical History:  Procedure Laterality Date  . BACK SURGERY  2008   X 5.Used her bone out of hips.  . CERVICAL FUSION     X 2, [place], [place], [place]  . DILATION AND CURETTAGE OF UTERUS    . ESOPHAGOGASTRODUODENOSCOPY N/A 01/03/2015   Procedure: ESOPHAGOGASTRODUODENOSCOPY (EGD);  Surgeon: Elnita Maxwell, MD;  Location:  [facility] END[facility];  Service: Endoscopy;  Laterality: N/A;  . EYE SURGERY Bilateral    Cataract Extraction with IOL  . HYSTEROSCOPY W/D&C N/A 11/05/2016   Procedure: DILATATION AND CURETTAGE /HYSTEROSCOPY/MYOSURE RESECTION OF ENDOMETRIAL POLYP;  Surgeon: Schermerhorn, Ihor Austin, MD;  Location: [facility] ORS;  Service: Gynecology;  Laterality: N/A;  . LUMBAR FUSION    . TONSILLECTOMY    . TOTAL HIP ARTHROPLASTY Right 08/04/2015   Procedure: TOTAL HIP ARTHROPLASTY ANTERIOR APPROACH;  Surgeon: Kennedy Bucker, MD;  Location: [facility] ORS;  Service: Orthopedics;  Laterality: Right;  . TOTAL HIP ARTHROPLASTY Left 09/10/2017   Procedure: TOTAL HIP  ARTHROPLASTY ANTERIOR APPROACH;  Surgeon: Kennedy Bucker, MD;  Location: [facility] ORS;  Service: Orthopedics;  Laterality: Left;    Prior to Admission medications   Medication Sig Start Date End Date Taking? Authorizing Provider  albuterol (PROVENTIL HFA;VENTOLIN HFA) 108 (90 Base) MCG/ACT inhaler Inhale 2 puffs into the lungs 4 (four) times daily as needed for wheezing or shortness of breath.    [provider]  alum & mag hydroxide-simeth (MAALOX PLUS) 400-400-40 MG/5ML suspension Take 15 mLs by mouth every 6 (six) hours as needed for indigestion.    [provider]  alum & mag hydroxide-simeth (MINTOX) 200-200-20 MG/5ML suspension Take 30 mLs by mouth 4 (four) times daily as needed for indigestion or heartburn.    [provider]  Benzocaine-Menthol (CEPACOL SORE THROAT) 15-2.6 MG LOZG Use as directed 1 lozenge in the mouth or throat every hour as needed (sore throat).    [provider]  buPROPion (WELLBUTRIN XL) 300 MG 24 hr tablet Take 300 mg by mouth daily.    [provider]  busPIRone (BUSPAR) 5 MG tablet Take 5 mg by mouth 2 (two) times daily.    [provider]  diclofenac sodium (VOLTAREN) 1 % GEL Apply 2 g topically 2 (two) times daily as needed (hip pain).    [provider]  dicyclomine (BENTYL) 20 MG tablet Take 10-20 mg by mouth See admin instructions. Take 10 mg by mouth at 0600 and 1300, then take 20 mgs at bedtime (2100)    [provider]  docusate sodium (COLACE) 100 MG capsule Take 1 capsule (100 mg total) by mouth 2 (two) times daily. 09/12/17   Evon Slack, PA-C  donepezil (ARICEPT) 10 MG tablet Take 10 mg by mouth at bedtime.     [provider]  Eszopiclone (ESZOPICLONE) 3 MG TABS Take 3 mg by mouth at bedtime. Take immediately before bedtime    [provider]  guaifenesin (ROBITUSSIN) 100 MG/5ML syrup Take 10 mLs by mouth every 6 (six) hours as needed for cough. Not to exceed 4 doses  in 24 hours    [provider]  HYDROcodone-acetaminophen (NORCO/VICODIN) 5-325 MG tablet Take 1-2 tablets by mouth every 4 (four) hours as needed for moderate pain (pain score 4-6). 09/12/17   Evon Slack, PA-C  Lidocaine (ASPERCREME LIDOCAINE) 4 % PTCH Apply 1 patch topically every 12 (twelve) hours as needed (pain).     [provider]  lisinopril (PRINIVIL,ZESTRIL) 20 MG tablet Take 20 mg by mouth daily.    [provider]  loperamide (IMODIUM) 2 MG capsule Take 2 mg by mouth as needed for diarrhea or loose stools.    [provider]  Melatonin 5 MG TABS Take 5 mg by mouth at bedtime.     [provider]  Menthol, Topical Analgesic, (BENGAY VANISHING SCENT) 2.5 % GEL Apply 1  application topically at bedtime as needed (muscle aches).     [provider]  Neomycin-Bacitracin-Polymyxin (TRIPLE ANTIBIOTIC) 3.5-4058382871 OINT Apply 1 application topically as needed. For minor skin tears or abrasion. Clean area with normal saline, apply ointment or equivalent, cover with bandaid or gauze and tape and change as needed until healed.    [provider]  omeprazole (PRILOSEC) 40 MG capsule Take 40 mg by mouth daily.    [provider]  pregabalin (LYRICA) 75 MG capsule Take 75 mg by mouth 2 (two) times daily.    [provider]  ranitidine (ZANTAC) 150 MG tablet Take 150 mg by mouth 2 (two) times daily.    [provider]  senna (SENOKOT) 8.6 MG TABS tablet Take 1 tablet by mouth daily.    [provider]  sertraline (ZOLOFT) 50 MG tablet Take 75 mg by mouth every evening.     [provider]  simvastatin (ZOCOR) 40 MG tablet Take 40 mg by mouth at bedtime.    [provider]  trolamine salicylate (ARTHRICREAM) 10 % cream Apply 1 application topically 2 (two) times daily as needed (shoulder and/or hip pain).     [provider]  vitamin C (ASCORBIC ACID) 500 MG tablet Take 1,000  mg by mouth daily.    [provider]  zinc gluconate 50 MG tablet Take 50 mg by mouth daily.    [provider]    Allergies Atorvastatin; Aspirin; Codeine; Morphine; and Nsaids  Family History  Problem Relation Age of Onset  . Alcohol abuse Father   . Depression Other   . Alcohol abuse Other     Social History Social History   Tobacco Use  . Smoking status: Current Every Day Smoker    Packs/day: 0.50    Types: Cigarettes  . Smokeless tobacco: Never Used  Substance Use Topics  . Alcohol use: No  . Drug use: No    Review of Systems Constitutional: No fever/chills Eyes: No visual changes. ENT: No sore throat. No stiff neck no neck pain Cardiovascular: Denies chest pain. Respiratory: Denies shortness of breath. Gastrointestinal:   no vomiting.  No diarrhea.  No constipation. Genitourinary: Negative for dysuria. Musculoskeletal: Negative lower extremity swelling Skin: Negative for rash. Neurological: Negative for severe headaches, focal weakness or numbness.   ____________________________________________   PHYSICAL EXAM:  VITAL SIGNS: ED Triage Vitals  Enc Vitals Group     BP 10/20/17 0833 136/72     Pulse Rate 10/20/17 0833 87     Resp 10/20/17 0833 20     Temp 10/20/17 0833 97.9 F (36.6 C)     Temp Source 10/20/17 0833 Oral     SpO2 10/20/17 0833 96 %     Weight 10/20/17 0831 199 lb 1.2 oz (90.3 kg)     Height 10/20/17 0831 5\' 6"  (1.676 m)     Head Circumference --      Peak Flow --      Pain Score 10/20/17 0831 9     Pain Loc --      Pain Edu? --      Excl. in GC? --     Constitutional: Alert and oriented. Well appearing and in no acute distress. Eyes: Conjunctivae are normal Head: Atraumatic HEENT: No congestion/rhinnorhea. Mucous membranes are moist.  Oropharynx non-erythematous Neck:   Nontender with no meningismus, no masses, no stridor Cardiovascular: Normal rate, regular rhythm. Grossly normal heart sounds.  Good  peripheral circulation. Respiratory: Normal respiratory effort.  No retractions. Lungs CTAB. Abdominal: Soft and nontender. No distention. No guarding no rebound Back:  There is no focal tenderness or step off.  there is no midline tenderness there are no lesions noted. there is no CVA tenderness Musculoskeletal: Patient with tenderness to palpation to the left gluteal region, there is no erythema, I can flex and extend the hip with no resistance or evidence of significant discomfort, with eversion and inversion of the hip there is no significant tenderness, at extreme flexion is when she feels a pulling in the muscles of her bottom she states.  It is not hot to touch it is not red there is no significant bruising, it is normal in appearance, deep palpation elicits the discomfort as well, no fluctuance, no induration, no upper extremity tenderness. No joint effusions, no DVT signs strong distal pulses no edema Neurologic:  Normal speech and language. No gross focal neurologic deficits are appreciated.  Skin:  Skin is warm, dry and intact. No rash noted. Psychiatric: Mood and affect are normal. Speech and behavior are normal.  ____________________________________________   LABS (all labs ordered are listed, but only abnormal results are displayed)  Labs Reviewed  BASIC METABOLIC PANEL - Abnormal; Notable for the following components:      Result Value   Glucose, Bld 103 (*)    All other components within normal limits  CBC WITH DIFFERENTIAL/PLATELET    Pertinent labs  results that were available during my care of the patient were reviewed by me and considered in my medical decision making (see chart for details). ____________________________________________  EKG  I personally interpreted any EKGs ordered by me or triage  ____________________________________________  RADIOLOGY  Pertinent labs & imaging results that were available during my care of the patient were reviewed by me and  considered in my medical decision making (see chart for details). If possible, patient and/or family made aware of any abnormal findings.  Dg Hip Unilat W Or Wo Pelvis 2-3 Views Left  Result Date: 10/20/2017 CLINICAL DATA:  Acute left hip pain. EXAM: DG HIP (WITH OR WITHOUT PELVIS) 2-3V LEFT COMPARISON:  Radiographs of September 10, 2017. FINDINGS: Status post left total hip arthroplasty. The femoral and acetabular components appear to be well situated. No fracture or dislocation is noted. Right total hip arthroplasty is also noted. No definite soft tissue abnormality is noted. IMPRESSION: Status post bilateral total hip arthroplasties. No acute abnormality seen. Electronically Signed   By: Lupita Raider, M.D.   On: 10/20/2017 09:08   ____________________________________________    PROCEDURES  Procedure(s) performed: None  Procedures  Critical Care performed: None  ____________________________________________   INITIAL IMPRESSION / ASSESSMENT AND PLAN / ED COURSE  Pertinent labs & imaging results that were available during my care of the patient were reviewed by me and considered in my medical decision making (see chart for details).  She with postop pain poorly controlled at home, did give her some fentanyl here and she is doing well.  I do not really see any indication that she has any kind of infection, she is not having any fevers is not red is not hot, is been going on for 9 days and she appears quite well.  We will get a CBC and BMP as a precaution we will x-ray the area to make sure there is been no interval injury, and if that is negative I think pain control and close outpatient follow-up with orthopedic for this issue would be likely the best course of  action  ----------------------------------------- 9:54 AM on 10/20/2017 -----------------------------------------  And very well controlled after 1 shot, no evidence of infection no evidence of hardware degradation or new injury,  no evidence of sciatic symptoms at this time, no evidence of cauda equina syndrome, I did discuss with Dr. Signa KellSunny Patel, who is on-call for Dr. Rosita KeaMenz, we discussed all the patient's findings and complaints and history, he agrees with outpatient continue management through his office and we will recommend that she call there at this time.  Return precautions follow-up given and understood.    ____________________________________________   FINAL CLINICAL IMPRESSION(S) / ED DIAGNOSES  Final diagnoses:  None      This chart was dictated using voice recognition software.  Despite best efforts to proofread,  errors can occur which can change meaning.      Jeanmarie PlantMcShane, Kamarri Lovvorn A, MD 10/20/17 [phone]    Jeanmarie PlantMcShane, Amandeep Hogston A, MD 10/20/17 [phone]

## 2017-10-20 NOTE — ED Notes (Signed)
Pt back from x-ray.

## 2017-10-20 NOTE — ED Triage Notes (Addendum)
Pt arrived from [facility] with complaints of uncontrolled pain at home. Pt states she had a left hip replaced last month and denies any new falls or injury. However, pt states a inability to control pain at home with her Vicodin. Pt states she worked with a physical therapist last week and "maybe overdid it." Pt states since pt she has had increased pain.

## 2017-10-20 NOTE — Discharge Instructions (Addendum)
Continue taking nonsteroidal pain medications as well as the Vicodin as prescribed, follow closely with Dr. Rosita KeaMenz tomorrow for this pain if you have any new or worrisome symptoms including fever, worsening pain numbness or weakness or other concerns return to the emergency department.

## 2018-12-12 IMAGING — DX DG HIP (WITH OR WITHOUT PELVIS) 2-3V*L*
2 series · 2 of 2 positions shown · non-contrast
Comparison: No recent prior.

CLINICAL DATA: Left hip replacement.

EXAM:
DG HIP (WITH OR WITHOUT PELVIS) 2-3V LEFT

[hip ap]
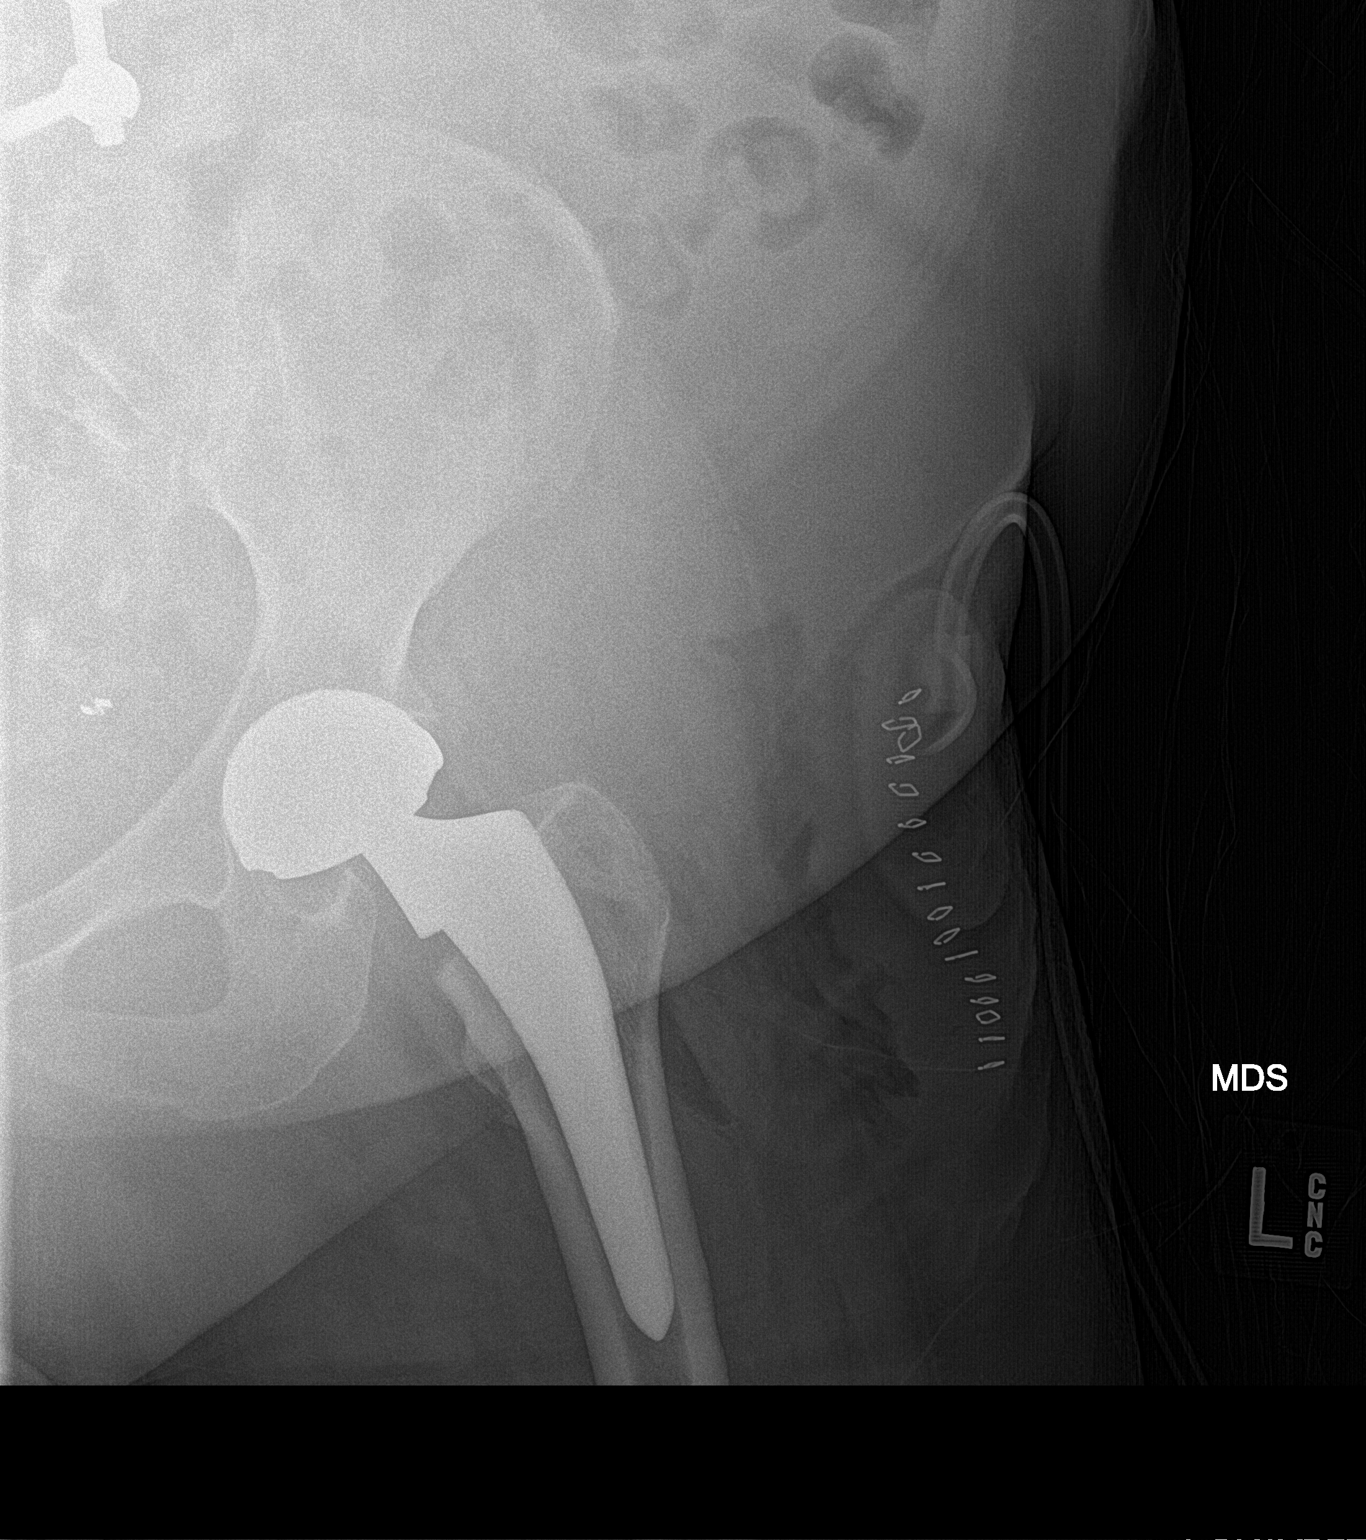

[hip lat]
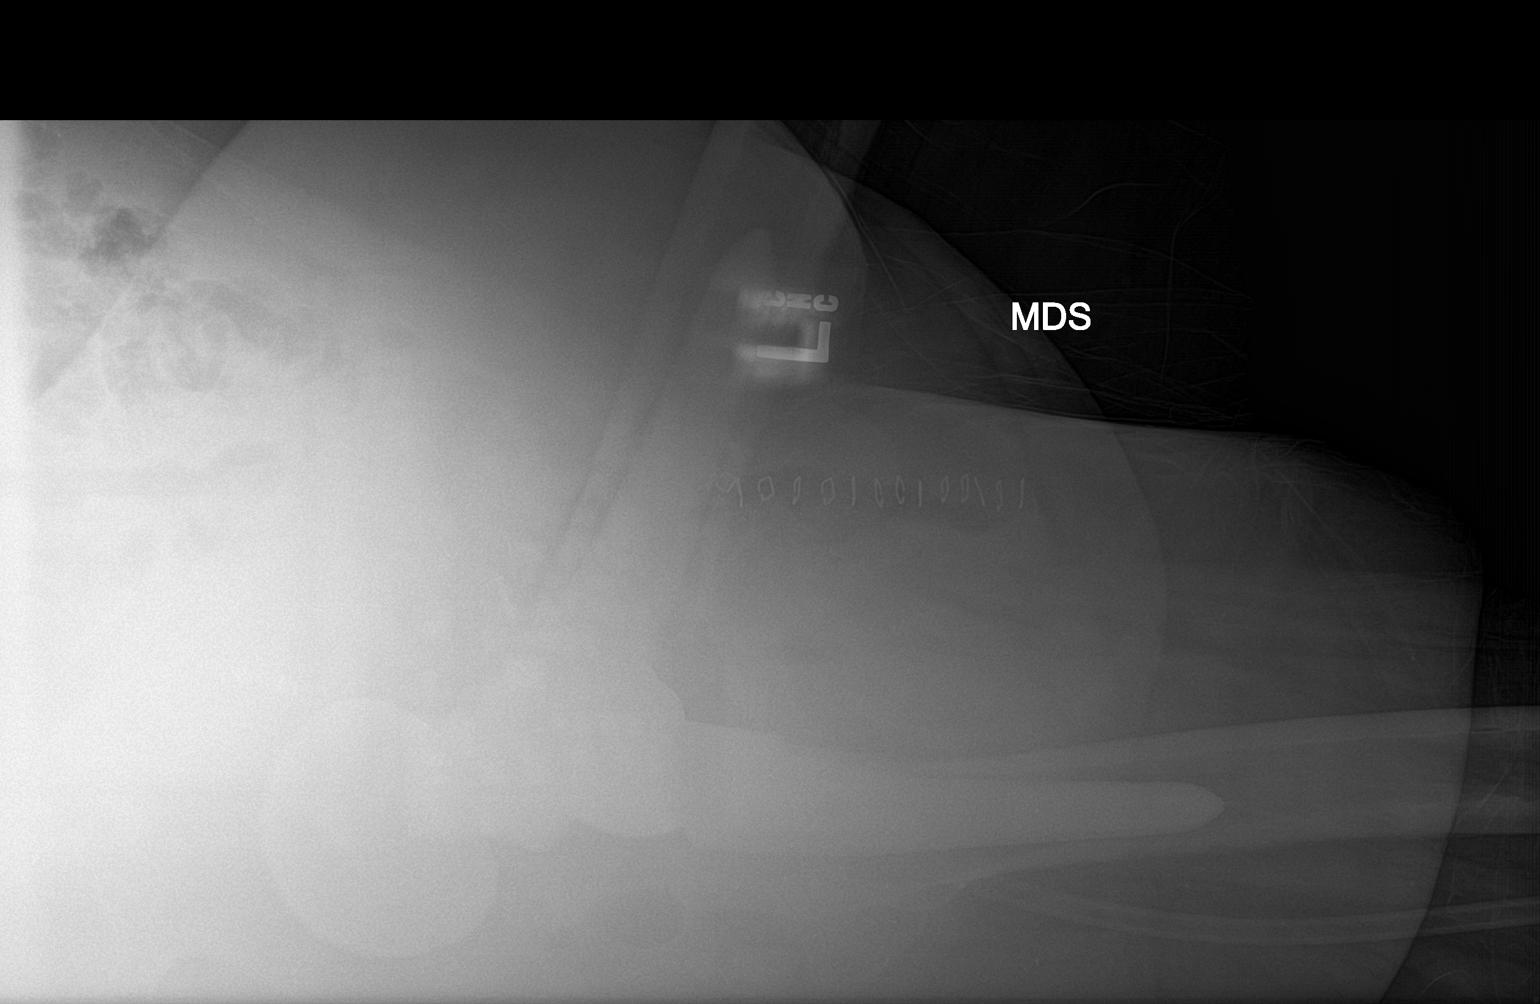

[2 of 2 positions shown; findings below may reference images not displayed]

FINDINGS: Total left hip replacement. Hardware intact. Anatomic alignment. No
acute bony abnormality.
IMPRESSION: Total left hip replacement with anatomic alignment.

## 2019-05-24 ENCOUNTER — Emergency Department (HOSPITAL_COMMUNITY)
Admission: EM | Admit: 2019-05-24 | Discharge: 2019-05-24 | Disposition: A | Discharge status: Home / Self Care | Payer: Medicare Other | Attending: Emergency Medicine | Admitting: Emergency Medicine

## 2019-05-24 ENCOUNTER — Emergency Department (HOSPITAL_COMMUNITY): Payer: Medicare Other

## 2019-05-24 ENCOUNTER — Other Ambulatory Visit: Payer: Self-pay

## 2019-05-24 DIAGNOSIS — Z79899 Other long term (current) drug therapy: Secondary | ICD-10-CM | POA: Insufficient documentation

## 2019-05-24 DIAGNOSIS — Z20822 Contact with and (suspected) exposure to covid-19: Secondary | ICD-10-CM | POA: Diagnosis not present

## 2019-05-24 DIAGNOSIS — R0602 Shortness of breath: Secondary | ICD-10-CM | POA: Diagnosis present

## 2019-05-24 DIAGNOSIS — I10 Essential (primary) hypertension: Secondary | ICD-10-CM | POA: Insufficient documentation

## 2019-05-24 DIAGNOSIS — J069 Acute upper respiratory infection, unspecified: Secondary | ICD-10-CM

## 2019-05-24 DIAGNOSIS — F17210 Nicotine dependence, cigarettes, uncomplicated: Secondary | ICD-10-CM | POA: Insufficient documentation

## 2019-05-24 LAB — BASIC METABOLIC PANEL
Anion gap: 10 (ref 5–15)
BUN: 13 mg/dL (ref 8–23)
CO2: 21 mmol/L — ABNORMAL LOW (ref 22–32)
Calcium: 9.2 mg/dL (ref 8.9–10.3)
Chloride: 109 mmol/L (ref 98–111)
Creatinine, Ser: 0.8 mg/dL (ref 0.44–1.00)
GFR calc Af Amer: >60 mL/min (ref >60)
GFR calc non Af Amer: >60 mL/min (ref >60)
Glucose, Bld: 121 mg/dL — ABNORMAL HIGH (ref 70–99)
Potassium: 4 mmol/L (ref 3.5–5.1)
Sodium: 140 mmol/L (ref 135–145)

## 2019-05-24 LAB — CBC WITH DIFFERENTIAL/PLATELET
Abs Immature Granulocytes: 0.05 10*3/uL (ref 0.00–0.07)
Basophils Absolute: 0 10*3/uL (ref 0.0–0.1)
Basophils Relative: 0 %
Eosinophils Absolute: 0 10*3/uL (ref 0.0–0.5)
Eosinophils Relative: 0 %
HCT: 41.5 % (ref 36.0–46.0)
Hemoglobin: 13.6 g/dL (ref 12.0–15.0)
Immature Granulocytes: 1 %
Lymphocytes Relative: 22 %
Lymphs Abs: 1.5 10*3/uL (ref 0.7–4.0)
MCH: 32.6 pg (ref 26.0–34.0)
MCHC: 32.8 g/dL (ref 30.0–36.0)
MCV: 99.5 fL (ref 80.0–100.0)
Monocytes Absolute: 0.1 10*3/uL (ref 0.1–1.0)
Monocytes Relative: 2 %
Neutro Abs: 5.1 10*3/uL (ref 1.7–7.7)
Neutrophils Relative %: 75 %
Platelets: 217 10*3/uL (ref 150–400)
RBC: 4.17 MIL/uL (ref 3.87–5.11)
RDW: 13.2 % (ref 11.5–15.5)
WBC: 6.9 10*3/uL (ref 4.0–10.5)
nRBC: 0 % (ref 0.0–0.2)

## 2019-05-24 LAB — BRAIN NATRIURETIC PEPTIDE: B Natriuretic Peptide: 62.3 pg/mL (ref 0.0–100.0)

## 2019-05-24 LAB — POC SARS CORONAVIRUS 2 AG -  ED: SARS Coronavirus 2 Ag: NEGATIVE

## 2019-05-24 MED ORDER — METHYLPREDNISOLONE SODIUM SUCC 125 MG IJ SOLR
125.0000 mg | Freq: Once | INTRAMUSCULAR | Status: AC
Start: 2019-05-24 — End: 2019-05-24
  Administered 2019-05-24: 125 mg via INTRAVENOUS
  Filled 2019-05-24: qty 2

## 2019-05-24 MED ORDER — ALBUTEROL SULFATE HFA 108 (90 BASE) MCG/ACT IN AERS
6.0000 | INHALATION_SPRAY | Freq: Once | RESPIRATORY_TRACT | Status: AC
  Administered 2019-05-24: 6 via RESPIRATORY_TRACT
  Filled 2019-05-24: qty 6.7

## 2019-05-24 NOTE — ED Triage Notes (Signed)
PT to ED by [facility] with c/o SOB. Pt was diagnosed on Wed with Bronchitis and given a Z-pack. Today patient felt more SOB with no relief of weakness or productive cough patient had when diagnosed with Bronchitis. [facility] gave patient Solumedrol and Mag en route.

## 2019-05-24 NOTE — ED Notes (Signed)
Patient SpO2 stayed 96% or above while ambulating

## 2019-05-24 NOTE — Discharge Instructions (Addendum)
Take 2 puffs of the albuterol inhaler every 4-6 hours as needed for wheezing.  You are also given medication for your cough.  Please take as directed.  You were tested for the coronavirus today.  The results are currently pending but will be available in the next 1 to 2 days.  If results are positive you will be contacted by the hospital.  Please follow up with your primary care provider within 3-5 days for re-evaluation of your symptoms. If you do not have a primary care provider, information for a healthcare clinic has been provided for you to make arrangements for follow up care. Please return to the emergency department for any new or worsening symptoms.

## 2019-05-24 NOTE — ED Provider Notes (Signed)
[facility]-EMERGENCY DEPT Provider Note   CSN: [id number] Arrival date & time: 05/24/19  1549     History Chief Complaint  Patient presents with  . Shortness of Breath    [name] is a 74 y.o. female.  HPI   75 year old female with a history of alcohol abuse, anemia, anxiety, cervical radiculopathy, depression, diverticulitis, GERD, hyperlipidemia, hypertension, migraines, who presents to the emergency department today for evaluation of productive cough, shortness of breath, fatigue, congestion and sore throat which started 5 days ago. She has had some orthopnea as well. She has had no fevers, chills or sweats.  She denies any chest pain or pain with inspiration.  Denies any bilateral lower extremity edema.  Her records to review patient tested negative for Covid.  She states she was diagnosed with bronchitis and started on azithromycin and prednisone.  She had no relief of her symptoms.  She denies any chronic lung disease.  Has smoked for the last 45 years.  Past Medical History:  Diagnosis Date  . ALCOHOL ABUSE, HX OF   . ANEMIA-NOS   . ANXIETY   . CERVICAL RADICULOPATHY, LEFT    5 BACK OPERATIONS AND 2 NECK FUSIONS  . DEGENERATIVE DISC DISEASE, CERVICAL SPINE   . DEGENERATIVE DISC DISEASE, CERVICAL SPINE, W/RADICULOPATHY   . Depressive disorder, not elsewhere classified   . Depressive type psychosis (HCC)   . DIVERTICULITIS, HX OF    CLEARED ACCORDING TO PATIENT  . DIZZINESS   . GERD   . HYPERLIPIDEMIA   . HYPERTENSION   . Insomnia, unspecified   . LOW BACK PAIN    RUPTURED DISC  . Memory loss   . MIGRAINE HEADACHE 09/06/2017   NOT CURRENTLY  . Palpitation    NOT A PROBLEM PER PATIENT  . WEIGHT LOSS     Patient Active Problem List   Diagnosis Date Noted  . Status post total hip replacement, left 09/10/2017  . Chest pain 11/29/2015  . Primary osteoarthritis of right hip 08/04/2015  . Preventative health care 01/15/2011  . WEIGHT LOSS  06/09/2009  . Depressive type psychosis (HCC) 10/12/2008  . CERVICAL RADICULOPATHY, LEFT 10/12/2008  . DIZZINESS 09/29/2008  . DEGENERATIVE DISC DISEASE, CERVICAL SPINE, W/RADICULOPATHY 07/21/2008  . Memory loss 01/09/2008  . Depressive disorder, not elsewhere classified 07/16/2007  . ANEMIA-NOS 01/14/2007  . HYPERTENSION 01/14/2007  . DEGENERATIVE DISC DISEASE, CERVICAL SPINE 01/14/2007  . LOW BACK PAIN 01/14/2007  . HYPERLIPIDEMIA 10/25/2006  . MIGRAINE HEADACHE 10/25/2006  . Insomnia, unspecified 10/25/2006  . ALCOHOL ABUSE, HX OF 10/25/2006  . DIVERTICULITIS, HX OF 10/25/2006  . ANXIETY 09/19/2006  . GERD 09/19/2006    Past Surgical History:  Procedure Laterality Date  . BACK SURGERY  2008   X 5.Used her bone out of hips.  . CERVICAL FUSION     X 2, [place], [place], [place]  . DILATION AND CURETTAGE OF UTERUS    . ESOPHAGOGASTRODUODENOSCOPY N/A 01/03/2015   Procedure: ESOPHAGOGASTRODUODENOSCOPY (EGD);  Surgeon: Elnita Maxwell, MD;  Location: [facility] ENDOSCOPY;  Service: Endoscopy;  Laterality: N/A;  . EYE SURGERY Bilateral    Cataract Extraction with IOL  . HYSTEROSCOPY WITH D & C N/A 11/05/2016   Procedure: DILATATION AND CURETTAGE /HYSTEROSCOPY/MYOSURE RESECTION OF ENDOMETRIAL POLYP;  Surgeon: Schermerhorn, Ihor Austin, MD;  Location: [facility] ORS;  Service: Gynecology;  Laterality: N/A;  . LUMBAR FUSION    . TONSILLECTOMY    . TOTAL HIP ARTHROPLASTY Right 08/04/2015   Procedure: TOTAL HIP  ARTHROPLASTY ANTERIOR APPROACH;  Surgeon: Kennedy BuckerMichael Menz, MD;  Location: [facility] ORS;  Service: Orthopedics;  Laterality: Right;  . TOTAL HIP ARTHROPLASTY Left 09/10/2017   Procedure: TOTAL HIP ARTHROPLASTY ANTERIOR APPROACH;  Surgeon: Kennedy BuckerMenz, Michael, MD;  Location: [facility] ORS;  Service: Orthopedics;  Laterality: Left;     OB History   No obstetric history on file.     Family History  Problem Relation Age of Onset  . Alcohol abuse Father   . Depression Other   . Alcohol abuse Other      Social History   Tobacco Use  . Smoking status: Current Every Day Smoker    Packs/day: 0.50    Types: Cigarettes  . Smokeless tobacco: Never Used  Substance Use Topics  . Alcohol use: No  . Drug use: No    Home Medications Prior to Admission medications   Medication Sig Start Date End Date Taking? Authorizing Provider  albuterol (PROVENTIL HFA;VENTOLIN HFA) 108 (90 Base) MCG/ACT inhaler Inhale 2 puffs into the lungs 4 (four) times daily as needed for wheezing or shortness of breath.    [provider]  alum & mag hydroxide-simeth (MAALOX PLUS) 400-400-40 MG/5ML suspension Take 15 mLs by mouth every 6 (six) hours as needed for indigestion.    [provider]  alum & mag hydroxide-simeth (MINTOX) 200-200-20 MG/5ML suspension Take 30 mLs by mouth 4 (four) times daily as needed for indigestion or heartburn.    [provider]  Benzocaine-Menthol (CEPACOL SORE THROAT) 15-2.6 MG LOZG Use as directed 1 lozenge in the mouth or throat every hour as needed (sore throat).    [provider]  buPROPion (WELLBUTRIN XL) 300 MG 24 hr tablet Take 300 mg by mouth daily.    [provider]  busPIRone (BUSPAR) 5 MG tablet Take 5 mg by mouth 2 (two) times daily.    [provider]  diclofenac sodium (VOLTAREN) 1 % GEL Apply 2 g topically 2 (two) times daily as needed (hip pain).    [provider]  dicyclomine (BENTYL) 20 MG tablet Take 10-20 mg by mouth See admin instructions. Take 10 mg by mouth at 0600 and 1300, then take 20 mgs at bedtime (2100)    [provider]  docusate sodium (COLACE) 100 MG capsule Take 1 capsule (100 mg total) by mouth 2 (two) times daily. 09/12/17   Evon SlackGaines, Thomas C, PA-C  donepezil (ARICEPT) 10 MG tablet Take 10 mg by mouth at bedtime.     [provider]  Eszopiclone (ESZOPICLONE) 3 MG TABS Take 3 mg by mouth at bedtime. Take immediately before bedtime    [provider]  guaifenesin  (ROBITUSSIN) 100 MG/5ML syrup Take 10 mLs by mouth every 6 (six) hours as needed for cough. Not to exceed 4 doses in 24 hours    [provider]  HYDROcodone-acetaminophen (NORCO/VICODIN) 5-325 MG tablet Take 1-2 tablets by mouth every 4 (four) hours as needed for moderate pain (pain score 4-6). 09/12/17   Evon SlackGaines, Thomas C, PA-C  Lidocaine (ASPERCREME LIDOCAINE) 4 % PTCH Apply 1 patch topically every 12 (twelve) hours as needed (pain).     [provider]  lisinopril (PRINIVIL,ZESTRIL) 20 MG tablet Take 20 mg by mouth daily.    [provider]  loperamide (IMODIUM) 2 MG capsule Take 2 mg by mouth as needed for diarrhea or loose stools.    [provider]  Melatonin 5 MG TABS Take 5 mg by mouth at bedtime.  [provider]  Menthol, Topical Analgesic, (BENGAY VANISHING SCENT) 2.5 % GEL Apply 1 application topically at bedtime as needed (muscle aches).     [provider]  Neomycin-Bacitracin-Polymyxin (TRIPLE ANTIBIOTIC) [phone] OINT Apply 1 application topically as needed. For minor skin tears or abrasion. Clean area with normal saline, apply ointment or equivalent, cover with bandaid or gauze and tape and change as needed until healed.    [provider]  omeprazole (PRILOSEC) 40 MG capsule Take 40 mg by mouth daily.    [provider]  pregabalin (LYRICA) 75 MG capsule Take 75 mg by mouth 2 (two) times daily.    [provider]  ranitidine (ZANTAC) 150 MG tablet Take 150 mg by mouth 2 (two) times daily.    [provider]  senna (SENOKOT) 8.6 MG TABS tablet Take 1 tablet by mouth daily.    [provider]  sertraline (ZOLOFT) 50 MG tablet Take 75 mg by mouth every evening.     [provider]  simvastatin (ZOCOR) 40 MG tablet Take 40 mg by mouth at bedtime.    [provider]  trolamine salicylate (ARTHRICREAM) 10 % cream Apply 1 application topically 2 (two) times daily as  needed (shoulder and/or hip pain).     [provider]  vitamin C (ASCORBIC ACID) 500 MG tablet Take 1,000 mg by mouth daily.    [provider]  zinc gluconate 50 MG tablet Take 50 mg by mouth daily.    [provider]    Allergies    Atorvastatin, Aspirin, Codeine, Morphine, and Nsaids  Review of Systems   Review of Systems  Constitutional: Negative for fever.  HENT: Positive for congestion and sore throat. Negative for ear pain.   Eyes: Negative for visual disturbance.  Respiratory: Positive for cough and shortness of breath.   Cardiovascular: Negative for chest pain and palpitations.  Gastrointestinal: Positive for diarrhea (resolved). Negative for abdominal pain, constipation, nausea and vomiting.  Genitourinary: Negative for dysuria and hematuria.  Musculoskeletal: Negative for back pain.  Skin: Negative for rash.  Neurological: Negative for headaches.  All other systems reviewed and are negative.   Physical Exam Updated Vital Signs BP 137/80   Pulse 72   Temp 98.1 F (36.7 C) (Oral)   Resp 11   Ht 5\' 6"  (1.676 m)   Wt 89.8 kg   SpO2 94%   BMI 31.96 kg/m   Physical Exam Vitals and nursing note reviewed.  Constitutional:      General: She is not in acute distress.    Appearance: She is well-developed.  HENT:     Head: Normocephalic and atraumatic.  Eyes:     Conjunctiva/sclera: Conjunctivae normal.  Cardiovascular:     Rate and Rhythm: Normal rate and regular rhythm.     Heart sounds: Normal heart sounds. No murmur.  Pulmonary:     Effort: Pulmonary effort is normal. No tachypnea or respiratory distress.     Breath sounds: Examination of the right-upper field reveals wheezing. Examination of the left-upper field reveals wheezing. Examination of the right-middle field reveals wheezing. Examination of the left-middle field reveals wheezing. Examination of the right-lower field reveals wheezing. Examination of the left-lower field reveals  wheezing. Wheezing present.  Abdominal:     Palpations: Abdomen is soft.     Tenderness: There is no abdominal tenderness.  Musculoskeletal:     Cervical back: Neck supple.     Right lower leg: No tenderness. No edema.  Left lower leg: No tenderness. No edema.  Skin:    General: Skin is warm and dry.  Neurological:     Mental Status: She is alert.     ED Results / Procedures / Treatments   Labs (all labs ordered are listed, but only abnormal results are displayed) Labs Reviewed  BASIC METABOLIC PANEL - Abnormal; Notable for the following components:      Result Value   CO2 21 (*)    Glucose, Bld 121 (*)    All other components within normal limits  SARS CORONAVIRUS 2 (TAT 6-24 HRS)  CBC WITH DIFFERENTIAL/PLATELET  BRAIN NATRIURETIC PEPTIDE  POC SARS CORONAVIRUS 2 AG -  ED    EKG None  Radiology DG Chest Port 1 View  Result Date: 05/24/2019 CLINICAL DATA:  Shortness of breath. EXAM: PORTABLE CHEST 1 VIEW COMPARISON:  11/29/2015 FINDINGS: The heart size and mediastinal contours are within normal limits. Both lungs are clear. No significant bone abnormality. Previous lower anterior cervical fusion. IMPRESSION: Essentially normal exam. Electronically Signed   By: Francene Boyers M.D.   On: 05/24/2019 16:44    Procedures Procedures (including critical care time)  Medications Ordered in ED Medications  albuterol (VENTOLIN HFA) 108 (90 Base) MCG/ACT inhaler 6 puff (6 puffs Inhalation Given 05/24/19 1724)  methylPREDNISolone sodium succinate (SOLU-MEDROL) 125 mg/2 mL injection 125 mg (125 mg Intravenous Given 05/24/19 1724)    ED Course  I have reviewed the triage vital signs and the nursing notes.  Pertinent labs & imaging results that were available during my care of the patient were reviewed by me and considered in my medical decision making (see chart for details).    MDM Rules/Calculators/A&P                     74 y/o F presenting to the ED c/o cough, sob that  started a few days ago.   Has wheezing in all lung fields. Heart with RRR. VS reassuring  Reviewed/interpreted labs CBC without leukocytosis or anemia BMP with normal electrolytes BNP pending POC COVID neg covid send out lab pending at the time of d/c  EKG with nsr, low voltage in the extremity and precordial leads  CXR personally reviewed/interpreted - no acute findings  Pt given albuterol and solumedrol. On reassessment she feels improved.  On reviewing her medications from her facility she is already on a prednisone taper.  I will also start her on albuterol and cough medications.  She has no evidence of pneumonia today.  Covid is still being considered.  She does not appear to require admission at this time.  She was ambulated with pulse ox and maintained sats at 96% and above.  She is well-appearing and has requested discharge several times during her stay.  I do feel this is appropriate as long as she has close follow-up.  Advised on return precautions.  She voiced understanding of the plan and reasons to return.  All questions answered.   At shift change, pt pending BNP. If neg she can be discharged. Care transitioned to Dr. Pilar Plate.   ----  [name] was evaluated in Emergency Department on 05/24/2019 for the symptoms described in the history of present illness. She was evaluated in the context of the global COVID-19 pandemic, which necessitated consideration that the patient might be at risk for infection with the SARS-CoV-2 virus that causes COVID-19. Institutional protocols and algorithms that pertain to the evaluation of patients at risk for COVID-19 are  in a state of rapid change based on information released by regulatory bodies including the CDC and federal and state organizations. These policies and algorithms were followed during the patient's care in the ED.  Final Clinical Impression(s) / ED Diagnoses Final diagnoses:  Upper respiratory tract infection, unspecified type    Person under investigation for COVID-19    Rx / DC Orders ED Discharge Orders    None       Rodney Booze, PA-C 05/24/19 1800    Maudie Flakes, MD 05/26/19 1627

## 2019-05-25 LAB — SARS CORONAVIRUS 2 (TAT 6-24 HRS): SARS Coronavirus 2: NEGATIVE

## 2019-05-26 ENCOUNTER — Other Ambulatory Visit: Payer: Self-pay | Admitting: Orthopaedic Surgery

## 2019-05-26 DIAGNOSIS — M4322 Fusion of spine, cervical region: Secondary | ICD-10-CM

## 2019-06-19 ENCOUNTER — Other Ambulatory Visit: Payer: Medicare Other

## 2019-07-18 ENCOUNTER — Encounter (HOSPITAL_COMMUNITY): Payer: Self-pay | Admitting: *Deleted

## 2019-07-18 ENCOUNTER — Emergency Department (HOSPITAL_COMMUNITY)
Admission: EM | Admit: 2019-07-18 | Discharge: 2019-07-18 | Disposition: A | Discharge status: Home / Self Care | Payer: Medicare Other | Attending: Emergency Medicine | Admitting: Emergency Medicine

## 2019-07-18 ENCOUNTER — Emergency Department (HOSPITAL_COMMUNITY): Payer: Medicare Other

## 2019-07-18 ENCOUNTER — Other Ambulatory Visit: Payer: Self-pay

## 2019-07-18 DIAGNOSIS — K859 Acute pancreatitis without necrosis or infection, unspecified: Secondary | ICD-10-CM

## 2019-07-18 DIAGNOSIS — Z79899 Other long term (current) drug therapy: Secondary | ICD-10-CM | POA: Diagnosis not present

## 2019-07-18 DIAGNOSIS — I10 Essential (primary) hypertension: Secondary | ICD-10-CM | POA: Diagnosis not present

## 2019-07-18 DIAGNOSIS — R1012 Left upper quadrant pain: Secondary | ICD-10-CM | POA: Insufficient documentation

## 2019-07-18 DIAGNOSIS — F17210 Nicotine dependence, cigarettes, uncomplicated: Secondary | ICD-10-CM | POA: Insufficient documentation

## 2019-07-18 DIAGNOSIS — R110 Nausea: Secondary | ICD-10-CM | POA: Diagnosis not present

## 2019-07-18 LAB — URINALYSIS, ROUTINE W REFLEX MICROSCOPIC
Bilirubin Urine: NEGATIVE
Glucose, UA: NEGATIVE mg/dL
Hgb urine dipstick: NEGATIVE
Ketones, ur: NEGATIVE mg/dL
Leukocytes,Ua: NEGATIVE
Nitrite: NEGATIVE
Protein, ur: NEGATIVE mg/dL
Specific Gravity, Urine: 1.008 (ref 1.005–1.030)
pH: 5 (ref 5.0–8.0)

## 2019-07-18 LAB — CBC
HCT: 40.8 % (ref 36.0–46.0)
Hemoglobin: 13.3 g/dL (ref 12.0–15.0)
MCH: 33.5 pg (ref 26.0–34.0)
MCHC: 32.6 g/dL (ref 30.0–36.0)
MCV: 102.8 fL — ABNORMAL HIGH (ref 80.0–100.0)
Platelets: 234 10*3/uL (ref 150–400)
RBC: 3.97 MIL/uL (ref 3.87–5.11)
RDW: 13.6 % (ref 11.5–15.5)
WBC: 10.4 10*3/uL (ref 4.0–10.5)
nRBC: 0 % (ref 0.0–0.2)

## 2019-07-18 LAB — COMPREHENSIVE METABOLIC PANEL
ALT: 11 U/L (ref 0–44)
AST: 13 U/L — ABNORMAL LOW (ref 15–41)
Albumin: 4.3 g/dL (ref 3.5–5.0)
Alkaline Phosphatase: 64 U/L (ref 38–126)
Anion gap: 6 (ref 5–15)
BUN: 11 mg/dL (ref 8–23)
CO2: 23 mmol/L (ref 22–32)
Calcium: 9.8 mg/dL (ref 8.9–10.3)
Chloride: 111 mmol/L (ref 98–111)
Creatinine, Ser: 0.6 mg/dL (ref 0.44–1.00)
GFR calc Af Amer: >60 mL/min (ref >60)
GFR calc non Af Amer: >60 mL/min (ref >60)
Glucose, Bld: 112 mg/dL — ABNORMAL HIGH (ref 70–99)
Potassium: 4.6 mmol/L (ref 3.5–5.1)
Sodium: 140 mmol/L (ref 135–145)
Total Bilirubin: 0.5 mg/dL (ref 0.3–1.2)
Total Protein: 7.3 g/dL (ref 6.5–8.1)

## 2019-07-18 LAB — LIPASE, BLOOD: Lipase: 53 U/L — ABNORMAL HIGH (ref 11–51)

## 2019-07-18 MED ORDER — PANTOPRAZOLE SODIUM 40 MG PO TBEC
40.0000 mg | DELAYED_RELEASE_TABLET | Freq: Once | ORAL | Status: AC
  Administered 2019-07-18: 40 mg via ORAL
  Filled 2019-07-18: qty 1

## 2019-07-18 MED ORDER — FENTANYL CITRATE (PF) 100 MCG/2ML IJ SOLN
50.0000 ug | Freq: Once | INTRAMUSCULAR | Status: AC
  Administered 2019-07-18: 50 ug via INTRAVENOUS
  Filled 2019-07-18: qty 2

## 2019-07-18 MED ORDER — SODIUM CHLORIDE (PF) 0.9 % IJ SOLN
INTRAMUSCULAR | Status: AC
  Filled 2019-07-18: qty 50

## 2019-07-18 MED ORDER — PANTOPRAZOLE SODIUM 40 MG PO TBEC: 40.0000 mg | DELAYED_RELEASE_TABLET | Freq: Every day | ORAL | 1 refills | Status: DC

## 2019-07-18 MED ORDER — SODIUM CHLORIDE 0.9% FLUSH: 3.0000 mL | Freq: Once | INTRAVENOUS | Status: DC

## 2019-07-18 MED ORDER — ONDANSETRON HCL 4 MG/2ML IJ SOLN
4.0000 mg | Freq: Once | INTRAMUSCULAR | Status: AC
  Administered 2019-07-18: 4 mg via INTRAVENOUS
  Filled 2019-07-18: qty 2

## 2019-07-18 MED ORDER — IOHEXOL 300 MG/ML  SOLN
100.0000 mL | Freq: Once | INTRAMUSCULAR | Status: AC | PRN
  Administered 2019-07-18: 100 mL via INTRAVENOUS

## 2019-07-18 MED ORDER — ALUM & MAG HYDROXIDE-SIMETH 200-200-20 MG/5ML PO SUSP
30.0000 mL | Freq: Once | ORAL | Status: AC
  Administered 2019-07-18: 30 mL via ORAL
  Filled 2019-07-18: qty 30

## 2019-07-18 MED ORDER — LIDOCAINE VISCOUS HCL 2 % MT SOLN
15.0000 mL | Freq: Once | OROMUCOSAL | Status: AC
  Administered 2019-07-18: 15 mL via ORAL
  Filled 2019-07-18: qty 15

## 2019-07-18 MED ORDER — LIDOCAINE VISCOUS HCL 2 % MT SOLN: 15.0000 mL | Freq: Four times a day (QID) | OROMUCOSAL | 1 refills | Status: DC | PRN

## 2019-07-18 NOTE — ED Triage Notes (Signed)
Pt arrives from [facility] via [facility] with c/o abdominal pain. hx of gastric ulcer. En route 150 BP palpated, 82 HR, 18 RR, 98.6 temp, 98% RA.

## 2019-07-18 NOTE — ED Provider Notes (Signed)
Signout from Dr. Elesa Massed.  74 year old female with left upper quadrant pain.  History of similar symptoms in the past.  She is pending a CT abdomen and pelvis.  If no acute findings discharge on PPI. Physical Exam  BP (!) 153/78 (BP Location: Right Arm)   Pulse 85   Temp 98 F (36.7 C)   Resp 16   Ht 5\' 6"  (1.676 m)   Wt 85.3 kg   SpO2 96%   BMI 30.34 kg/m   Physical Exam  ED Course/Procedures     Procedures  MDM  CT read as area of focal pancreatitis.  Patient's lipase was 53.  Reviewed all this with her.  She said her pain is resolved.  She is comfortable going home and will provide prescriptions from Dr. for Protonix and lidocaine.  Return instructions discussed.  Patient denies any alcohol use.       Elesa Massed, MD 07/18/19 1739

## 2019-07-18 NOTE — Discharge Instructions (Addendum)
Please continue Protonix 40 mg daily.  If you are not on this medication, a new prescription has been provided for you.  Please follow-up closely with your gastroenterologist as an outpatient.  You may use over-the-counter Tums, Mylanta or Maalox as needed for symptomatic relief.

## 2019-07-18 NOTE — ED Notes (Signed)
Patient transported to CT at this time. 

## 2019-07-18 NOTE — ED Triage Notes (Signed)
Pt says that she has been having pain in the left upper abdomen area since about 2000 last night. She says this is typically where her pain presents with her gastric ulcers. She has had nausea, no vomiting. She ate spaghetti for dinner. She took Mylanta x 4 and drank milk without relief.

## 2019-07-18 NOTE — ED Provider Notes (Signed)
TIME SEEN: 6:06 AM  CHIEF COMPLAINT: Left upper quadrant abdominal pain  HPI: Patient is a 74 year old female with history of hypertension, hyperlipidemia, obesity, diverticulitis, peptic ulcer disease with last endoscopy on 01/03/2015 with Dr. Shelle Iron at [facility] who presents to the emergency department complaints of left upper quadrant abdominal pain that she states seems to start bothering her "every spring".  She denies chest pain, shortness of breath, bloody stools, melena.  She has had nausea but no vomiting.  She is not on a PPI.  She states normally she comes here to get a GI cocktail and feels better.  Symptoms became worse at 8 PM last night after eating spaghetti.  She has been taking over-the-counter Mylanta without relief.  Per records, patient's endoscopy in November 2016 showed gastritis, healed ulcer bed in the gastric antrum, healed ulcer bed in the duodenal bulb.  ROS: See HPI Constitutional: no fever  Eyes: no drainage  ENT: no runny nose   Cardiovascular:  no chest pain  Resp: no SOB  GI: no vomiting GU: no dysuria Integumentary: no rash  Allergy: no hives  Musculoskeletal: no leg swelling  Neurological: no slurred speech ROS otherwise negative  PAST MEDICAL HISTORY/PAST SURGICAL HISTORY:  Past Medical History:  Diagnosis Date  . ALCOHOL ABUSE, HX OF   . ANEMIA-NOS   . ANXIETY   . CERVICAL RADICULOPATHY, LEFT    5 BACK OPERATIONS AND 2 NECK FUSIONS  . DEGENERATIVE DISC DISEASE, CERVICAL SPINE   . DEGENERATIVE DISC DISEASE, CERVICAL SPINE, W/RADICULOPATHY   . Depressive disorder, not elsewhere classified   . Depressive type psychosis (HCC)   . DIVERTICULITIS, HX OF    CLEARED ACCORDING TO PATIENT  . DIZZINESS   . GERD   . HYPERLIPIDEMIA   . HYPERTENSION   . Insomnia, unspecified   . LOW BACK PAIN    RUPTURED DISC  . Memory loss   . MIGRAINE HEADACHE 09/06/2017   NOT CURRENTLY  . Palpitation    NOT A PROBLEM PER PATIENT  . WEIGHT LOSS     MEDICATIONS:   Prior to Admission medications   Medication Sig Start Date End Date Taking? Authorizing Provider  acetaminophen (TYLENOL) 500 MG tablet Take 500 mg by mouth every 8 (eight) hours as needed for moderate pain.    [provider]  acetaminophen (TYLENOL) 650 MG CR tablet Take 650 mg by mouth 3 (three) times daily.    [provider]  albuterol (PROVENTIL HFA;VENTOLIN HFA) 108 (90 Base) MCG/ACT inhaler Inhale 2 puffs into the lungs 4 (four) times daily as needed for wheezing or shortness of breath.    [provider]  alum & mag hydroxide-simeth (MINTOX) 200-200-20 MG/5ML suspension Take 30 mLs by mouth 4 (four) times daily as needed for indigestion or heartburn.    [provider]  azithromycin (ZITHROMAX) 500 MG tablet Take 500 mg by mouth daily.    [provider]  buPROPion (WELLBUTRIN XL) 300 MG 24 hr tablet Take 300 mg by mouth daily.    [provider]  busPIRone (BUSPAR) 5 MG tablet Take 5 mg by mouth 2 (two) times daily.    [provider]  CAMPHOR-EUCALYPTUS-MENTHOL EX Apply 1 application topically every 6 (six) hours as needed (congestion).    [provider]  diclofenac sodium (VOLTAREN) 1 % GEL Apply 2 g topically 2 (two) times daily as needed (hip pain).    [provider]  dicyclomine (BENTYL) 20 MG tablet Take 20 mg by mouth 2 (  two) times daily.     [provider]  docusate sodium (COLACE) 100 MG capsule Take 1 capsule (100 mg total) by mouth 2 (two) times daily. Patient not taking: Reported on 05/24/2019 09/12/17   Evon Slack, PA-C  donepezil (ARICEPT) 10 MG tablet Take 10 mg by mouth at bedtime.     [provider]  Eszopiclone (ESZOPICLONE) 3 MG TABS Take 3 mg by mouth at bedtime. Take immediately before bedtime    [provider]  guaifenesin (ROBITUSSIN) 100 MG/5ML syrup Take 10 mLs by mouth every 6 (six) hours as needed for cough. Not to exceed 4 doses in 24 hours     [provider]  HYDROcodone-acetaminophen (NORCO/VICODIN) 5-325 MG tablet Take 1-2 tablets by mouth every 4 (four) hours as needed for moderate pain (pain score 4-6). Patient not taking: Reported on 05/24/2019 09/12/17   Evon Slack, PA-C  lisinopril (PRINIVIL,ZESTRIL) 20 MG tablet Take 20 mg by mouth daily.    [provider]  loperamide (IMODIUM) 2 MG capsule Take 2 mg by mouth as needed for diarrhea or loose stools.    [provider]  magnesium hydroxide (MILK OF MAGNESIA) 400 MG/5ML suspension Take 30 mLs by mouth daily as needed for mild constipation or moderate constipation.    [provider]  methocarbamol (ROBAXIN) 750 MG tablet Take 750 mg by mouth every 6 (six) hours as needed for muscle spasms.    [provider]  Neomycin-Bacitracin-Polymyxin (TRIPLE ANTIBIOTIC) [phone] OINT Apply 1 application topically daily as needed (For minor skin tears or abrasion. Clean area with normal saline, apply ointment or equivalent, cover with bandaid or gauze and tape and change as needed until healed.).     [provider]  ondansetron (ZOFRAN-ODT) 8 MG disintegrating tablet Take 8 mg by mouth every 6 (six) hours as needed for nausea or vomiting.    [provider]  pantoprazole (PROTONIX) 40 MG tablet Take 40 mg by mouth daily.    [provider]  predniSONE (DELTASONE) 5 MG tablet Take 5 mg by mouth See admin instructions. Take one by mouth before breakfast, one after lunch, and one at bedtime    [provider]  pregabalin (LYRICA) 100 MG capsule Take 100 mg by mouth 3 (three) times daily.    [provider]  senna (SENOKOT) 8.6 MG TABS tablet Take 1 tablet by mouth daily.    [provider]  sertraline (ZOLOFT) 50 MG tablet Take 75 mg by mouth every evening.     [provider]  simvastatin (ZOCOR) 40 MG tablet Take 40 mg by mouth at bedtime.    [provider]  tiZANidine  (ZANAFLEX) 2 MG tablet Take 2 mg by mouth 2 (two) times daily as needed for muscle spasms.    [provider]  traMADol (ULTRAM) 50 MG tablet Take 50 mg by mouth 4 (four) times daily. 01/09/18   [provider]  traZODone (DESYREL) 50 MG tablet Take 50 mg by mouth at bedtime.    [provider]  trolamine salicylate (ARTHRICREAM) 10 % cream Apply 1 application topically 2 (two) times daily as needed (shoulder and/or hip pain).     [provider]    ALLERGIES:  Allergies  Allergen Reactions  . Atorvastatin Other (See Comments)    Unknown; not listed on MAR  . Aspirin Other (See Comments)    Reaction: stomach ache  . Codeine Itching  . Morphine Other (See Comments)    "  severe headache"  . Nsaids     STOMACH PAINS    SOCIAL HISTORY:  Social History   Tobacco Use  . Smoking status: Current Every Day Smoker    Packs/day: 0.50    Types: Cigarettes  . Smokeless tobacco: Never Used  Substance Use Topics  . Alcohol use: No    FAMILY HISTORY: Family History  Problem Relation Age of Onset  . Alcohol abuse Father   . Depression Other   . Alcohol abuse Other     EXAM: BP (!) 153/78 (BP Location: Right Arm)   Pulse 85   Temp 98 F (36.7 C)   Resp 16   Ht 5\' 6"  (1.676 m)   Wt 85.3 kg   SpO2 96%   BMI 30.34 kg/m  CONSTITUTIONAL: Alert and oriented and responds appropriately to questions. Well-appearing; well-nourished, elderly, obese HEAD: Normocephalic EYES: Conjunctivae clear, pupils appear equal, EOM appear intact ENT: normal nose; moist mucous membranes NECK: Supple, normal ROM CARD: RRR; S1 and S2 appreciated; no murmurs, no clicks, no rubs, no gallops RESP: Normal chest excursion without splinting or tachypnea; breath sounds clear and equal bilaterally; no wheezes, no rhonchi, no rales, no hypoxia or respiratory distress, speaking full sentences ABD/GI: Normal bowel sounds; non-distended; soft, tender to palpation in the epigastric  region and left upper quadrant, no rebound, no guarding, no peritoneal signs, no hepatosplenomegaly BACK:  The back appears normal EXT: Normal ROM in all joints; no deformity noted, no edema; no cyanosis SKIN: Normal color for age and race; warm; no rash on exposed skin NEURO: Moves all extremities equally PSYCH: The patient's mood and manner are appropriate.   MEDICAL DECISION MAKING: Patient here with upper abdominal pain.  She describes as feeling exactly like her previous episodes of gastritis, gastric ulcer.  Denies any signs or symptoms of bleeding.  No vomiting.  No fever.  Patient declines any imaging of her abdomen at this time but agrees to lab work and would like a GI cocktail.  We will also give PPI and have recommended that she go back on a PPI and follow-up with her gastroenterologist.  ED PROGRESS: Labs have been reviewed/interpreted and show no significant abnormality.  Patient reports only minimal improvement with GI cocktail.  I have recommended imaging of her abdomen for further evaluation and to look for/rule out pancreatitis, colitis, perforated ulcer, pyelonephritis, kidney stone.  Doubt appendicitis or diverticulitis based on location of pain.  She asked for something else for pain as well.  Will give fentanyl, Zofran.  She agrees to CT imaging.  Also obtain urinalysis.  Signed out to Dr. Melina Copa to follow-up on urinalysis, patient's imaging and reassess patient's symptoms.   I reviewed all nursing notes and pertinent previous records as available.  I have reviewed and interpreted any EKGs, lab and urine results, imaging (as available).      [name] was evaluated in Emergency Department on 07/18/2019 for the symptoms described in the history of present illness. She was evaluated in the context of the global COVID-19 pandemic, which necessitated consideration that the patient might be at risk for infection with the SARS-CoV-2 virus that causes COVID-19. Institutional  protocols and algorithms that pertain to the evaluation of patients at risk for COVID-19 are in a state of rapid change based on information released by regulatory bodies including the CDC and federal and state organizations. These policies and algorithms were followed during the patient's care in the ED.      [name], Cyril Mourning  N, DO 07/18/19 [id number]

## 2019-07-21 ENCOUNTER — Other Ambulatory Visit: Payer: Medicare Other

## 2019-08-02 ENCOUNTER — Other Ambulatory Visit: Payer: Medicare Other

## 2019-08-03 ENCOUNTER — Ambulatory Visit (INDEPENDENT_AMBULATORY_CARE_PROVIDER_SITE_OTHER): Payer: Medicare Other | Admitting: Podiatry

## 2019-08-03 ENCOUNTER — Other Ambulatory Visit: Payer: Self-pay

## 2019-08-03 ENCOUNTER — Encounter: Payer: Self-pay | Admitting: Podiatry

## 2019-08-03 VITALS — Temp 97.5°F

## 2019-08-03 DIAGNOSIS — L03032 Cellulitis of left toe: Secondary | ICD-10-CM | POA: Diagnosis not present

## 2019-08-03 DIAGNOSIS — L02612 Cutaneous abscess of left foot: Secondary | ICD-10-CM

## 2019-08-03 MED ORDER — DOXYCYCLINE HYCLATE 100 MG PO TABS: 100.0000 mg | ORAL_TABLET | Freq: Two times a day (BID) | ORAL | 0 refills | Status: DC

## 2019-08-03 NOTE — Progress Notes (Signed)
Subjective:   Patient ID: [name], female   DOB: 74 y.o.   MRN: [id number]   HPI 74 year old female presents the office today for concerns of ingrown toenail, pain to her left big toe.  She states that she had quite a bit of redness to the toe when she was seen in urgent care and placed on Keflex and she finished this yesterday.  She states that it helped quite a bit with still red and painful mostly the base of the nail.  She has not been all to appreciate any drainage or pus.  This been ongoing about 3 weeks.  She is also been soaking Epson salts.   Review of Systems  All other systems reviewed and are negative.  Past Medical History:  Diagnosis Date   ALCOHOL ABUSE, HX OF    ANEMIA-NOS    ANXIETY    CERVICAL RADICULOPATHY, LEFT    5 BACK OPERATIONS AND 2 NECK FUSIONS   DEGENERATIVE DISC DISEASE, CERVICAL SPINE    DEGENERATIVE DISC DISEASE, CERVICAL SPINE, W/RADICULOPATHY    Depressive disorder, not elsewhere classified    Depressive type psychosis ([place])    DIVERTICULITIS, HX OF    CLEARED ACCORDING TO PATIENT   DIZZINESS    GERD    HYPERLIPIDEMIA    HYPERTENSION    Insomnia, unspecified    LOW BACK PAIN    RUPTURED DISC   Memory loss    MIGRAINE HEADACHE 09/06/2017   NOT CURRENTLY   Palpitation    NOT A PROBLEM PER PATIENT   WEIGHT LOSS     Past Surgical History:  Procedure Laterality Date   BACK SURGERY  2008   X 5.Used her bone out of hips.   CERVICAL FUSION     X 2, [facility] [place], [place], [place]   DILATION AND CURETTAGE OF UTERUS     ESOPHAGOGASTRODUODENOSCOPY N/A 01/03/2015   Procedure: ESOPHAGOGASTRODUODENOSCOPY (EGD);  Surgeon: Josefine Class, MD;  Location: [facility] ENDOSCOPY;  Service: Endoscopy;  Laterality: N/A;   EYE SURGERY Bilateral    Cataract Extraction with IOL   HYSTEROSCOPY WITH D & C N/A 11/05/2016   Procedure: DILATATION AND CURETTAGE /HYSTEROSCOPY/MYOSURE RESECTION OF ENDOMETRIAL POLYP;  Surgeon: Schermerhorn, Gwen Her, MD;  Location: [facility] ORS;  Service: Gynecology;  Laterality: N/A;   LUMBAR FUSION     TONSILLECTOMY     TOTAL HIP ARTHROPLASTY Right 08/04/2015   Procedure: TOTAL HIP ARTHROPLASTY ANTERIOR APPROACH;  Surgeon: Hessie Knows, MD;  Location: [facility] ORS;  Service: Orthopedics;  Laterality: Right;   TOTAL HIP ARTHROPLASTY Left 09/10/2017   Procedure: TOTAL HIP ARTHROPLASTY ANTERIOR APPROACH;  Surgeon: Hessie Knows, MD;  Location: [facility] ORS;  Service: Orthopedics;  Laterality: Left;     Current Outpatient Medications:    acetaminophen (TYLENOL) 500 MG tablet, Take 500 mg by mouth every 8 (eight) hours as needed for moderate pain., Disp: , Rfl:    acetaminophen (TYLENOL) 650 MG CR tablet, Take 650 mg by mouth in the morning, at noon, and at bedtime., Disp: , Rfl:    albuterol (PROVENTIL HFA;VENTOLIN HFA) 108 (90 Base) MCG/ACT inhaler, Inhale 2 puffs into the lungs 4 (four) times daily as needed for wheezing or shortness of breath., Disp: , Rfl:    alum & mag hydroxide-simeth (MINTOX) 607-371-06 MG/5ML suspension, Take 30 mLs by mouth 4 (four) times daily as needed for indigestion or heartburn., Disp: , Rfl:    buPROPion (WELLBUTRIN XL) 300 MG 24 hr tablet, Take 300 mg by mouth daily.,  Disp: , Rfl:    busPIRone (BUSPAR) 5 MG tablet, Take 5 mg by mouth 2 (two) times daily., Disp: , Rfl:    CAMPHOR-EUCALYPTUS-MENTHOL EX, Apply 1 application topically every 6 (six) hours as needed (congestion)., Disp: , Rfl:    diclofenac sodium (VOLTAREN) 1 % GEL, Apply 2 g topically 2 (two) times daily as needed (hip pain)., Disp: , Rfl:    dicyclomine (BENTYL) 20 MG tablet, Take 20 mg by mouth 2 (two) times daily. , Disp: , Rfl:    donepezil (ARICEPT) 10 MG tablet, Take 10 mg by mouth at bedtime. , Disp: , Rfl:    doxycycline (VIBRA-TABS) 100 MG tablet, Take 1 tablet (100 mg total) by mouth 2 (two) times daily., Disp: 20 tablet, Rfl: 0   Eszopiclone (ESZOPICLONE) 3 MG TABS, Take 3 mg by mouth at bedtime.  Take immediately before bedtime, Disp: , Rfl:    guaifenesin (ROBITUSSIN) 100 MG/5ML syrup, Take 10 mLs by mouth every 6 (six) hours as needed for cough. Not to exceed 4 doses in 24 hours, Disp: , Rfl:    lidocaine (XYLOCAINE) 2 % solution, Use as directed 15 mLs in the mouth or throat every 6 (six) hours as needed for mouth pain., Disp: 300 mL, Rfl: 1   lisinopril (PRINIVIL,ZESTRIL) 20 MG tablet, Take 20 mg by mouth daily., Disp: , Rfl:    loperamide (IMODIUM) 2 MG capsule, Take 2 mg by mouth as needed for diarrhea or loose stools., Disp: , Rfl:    magnesium hydroxide (MILK OF MAGNESIA) 400 MG/5ML suspension, Take 30 mLs by mouth daily as needed for mild constipation or moderate constipation., Disp: , Rfl:    Neomycin-Bacitracin-Polymyxin (TRIPLE ANTIBIOTIC) [phone] OINT, Apply 1 application topically daily as needed (For minor skin tears or abrasion. Clean area with normal saline, apply ointment or equivalent, cover with bandaid or gauze and tape and change as needed until healed.). , Disp: , Rfl:    ondansetron (ZOFRAN-ODT) 8 MG disintegrating tablet, Take 8 mg by mouth every 6 (six) hours as needed for nausea or vomiting., Disp: , Rfl:    pantoprazole (PROTONIX) 40 MG tablet, Take 40 mg by mouth daily., Disp: , Rfl:    pantoprazole (PROTONIX) 40 MG tablet, Take 1 tablet (40 mg total) by mouth daily., Disp: 30 tablet, Rfl: 1   pregabalin (LYRICA) 100 MG capsule, Take 100 mg by mouth 3 (three) times daily., Disp: , Rfl:    senna (SENOKOT) 8.6 MG TABS tablet, Take 1 tablet by mouth daily., Disp: , Rfl:    sertraline (ZOLOFT) 50 MG tablet, Take 75 mg by mouth every evening. , Disp: , Rfl:    simvastatin (ZOCOR) 40 MG tablet, Take 40 mg by mouth at bedtime., Disp: , Rfl:    tiZANidine (ZANAFLEX) 2 MG tablet, Take 2 mg by mouth in the morning and at bedtime. , Disp: , Rfl:    traMADol (ULTRAM) 50 MG tablet, Take 50 mg by mouth 4 (four) times daily., Disp: , Rfl:    traZODone  (DESYREL) 50 MG tablet, Take 50 mg by mouth at bedtime., Disp: , Rfl:   Allergies  Allergen Reactions   Atorvastatin Other (See Comments)    Unknown; not listed on MAR   Aspirin Other (See Comments)    Reaction: stomach ache   Codeine Itching   Morphine Other (See Comments)    "severe headache"   Nsaids     STOMACH PAINS         Objective:  Physical  Exam  General: AAO x3, NAD  Dermatological: Left hallux toenails hypertrophic, dystrophic with yellow-brown discoloration.  See procedure note below but there was quite a bit of purulence from the proximal nail fold during the procedure.  Vascular: Dorsalis Pedis artery and Posterior Tibial artery pedal pulses are 2/4 bilateral with immedate capillary fill time.  There is no pain with calf compression, swelling, warmth, erythema.   Neruologic: Grossly intact via light touch bilateral.   Musculoskeletal: No gross boney pedal deformities bilateral. No pain, crepitus, or limitation noted with foot and ankle range of motion bilateral. Muscular strength 5/5 in all groups tested bilateral.  Gait: Unassisted, Nonantalgic.       Assessment:   74 year old female left hallux toenail infection     Plan:  -Treatment options discussed including all alternatives, risks, and complications -Etiology of symptoms were discussed -At this time, recommended total nail removal without chemical matricectomy to the left due to infection. Risks and complications were discussed with the patient for which they understand and  verbally consent to the procedure. Under sterile conditions a total of 3 mL of a mixture of 2% lidocaine plain and 0.5% Marcaine plain was infiltrated in a hallux block fashion. Once anesthetized, the skin was prepped in sterile fashion. A tourniquet was then applied. Next the left hallux nail border was sharply excised making sure to remove the entire offending nail border and there was quite a bit of purulence at the base of  the toenail which was cultured today.  Once the nail was removed, the area was debrided and the underlying skin was intact. The area was irrigated and hemostasis was obtained.  A dry sterile dressing was applied. After application of the dressing the tourniquet was removed and there is found to be an immediate capillary refill time to the digit. The patient tolerated the procedure well any complications. Post procedure instructions were discussed the patient for which he verbally understood. Follow-up in one week for nail check or sooner if any problems are to arise. Discussed signs/symptoms of worsening infection and directed to call the office immediately should any occur or go directly to the emergency room. In the meantime, encouraged to call the office with any questions, concerns, changes symptoms. -Doxycyline  Return in about 1 week (around 08/10/2019).  Vivi Barrack DPM

## 2019-08-03 NOTE — Patient Instructions (Signed)

## 2019-08-06 LAB — WOUND CULTURE
GRAM STAIN:: NONE SEEN
MICRO NUMBER:: 10560888
RESULT:: NO GROWTH
SPECIMEN QUALITY:: ADEQUATE

## 2019-08-11 ENCOUNTER — Ambulatory Visit: Payer: Medicare Other | Admitting: Podiatry

## 2019-09-21 ENCOUNTER — Ambulatory Visit (INDEPENDENT_AMBULATORY_CARE_PROVIDER_SITE_OTHER): Payer: Medicare Other | Admitting: Podiatry

## 2019-09-21 ENCOUNTER — Other Ambulatory Visit: Payer: Self-pay

## 2019-09-21 DIAGNOSIS — L02612 Cutaneous abscess of left foot: Secondary | ICD-10-CM

## 2019-09-21 DIAGNOSIS — L03032 Cellulitis of left toe: Secondary | ICD-10-CM

## 2019-09-21 NOTE — Patient Instructions (Signed)

## 2019-09-26 NOTE — Progress Notes (Signed)
Subjective: 74 year old female presents the office today for follow-up evaluation after undergoing left total nail avulsion.  She feels that the area has healed although it looks ugly with a scab.  Denies any drainage or pus or any swelling.  She has no new concerns today. Denies any systemic complaints such as fevers, chills, nausea, vomiting. No acute changes since last appointment, and no other complaints at this time.   Objective: AAO x3, NAD DP/PT pulses palpable bilaterally, CRT less than 3 seconds Left hallux nail bed appears to be healed and there is a scab present on the area there is no drainage or pus there is no edema, erythema or any clinical signs of infection.  There is no pain. No pain with calf compression, swelling, warmth, erythema  Assessment: Status post left total nail avulsion, healed- resolved infection   Plan: -All treatment options discussed with the patient including all alternatives, risks, complications.  -I would occasionally Epson salts and she can wash with soap and water daily to try to get some of the scab off.  I would keep covered with antibiotic ointment and a bandage during the day but leave the area open at night.  Monitoring signs or symptoms of infection.  I will monitor the nails as it hopefully grows back in. -Patient encouraged to call the office with any questions, concerns, change in symptoms.   Vivi Barrack DPM

## 2019-11-10 ENCOUNTER — Ambulatory Visit
Admission: RE | Admit: 2019-11-10 | Discharge: 2019-11-10 | Disposition: A | Discharge status: Home / Self Care | Payer: Medicare Other | Source: Ambulatory Visit | Attending: Orthopaedic Surgery | Admitting: Orthopaedic Surgery

## 2019-11-10 DIAGNOSIS — M4322 Fusion of spine, cervical region: Secondary | ICD-10-CM

## 2020-05-19 DIAGNOSIS — G629 Polyneuropathy, unspecified: Secondary | ICD-10-CM | POA: Insufficient documentation

## 2020-05-19 DIAGNOSIS — F1021 Alcohol dependence, in remission: Secondary | ICD-10-CM | POA: Insufficient documentation

## 2021-07-17 ENCOUNTER — Encounter: Payer: Self-pay | Admitting: Physician Assistant

## 2022-09-09 ENCOUNTER — Encounter (HOSPITAL_COMMUNITY): Payer: Self-pay

## 2022-09-09 ENCOUNTER — Inpatient Hospital Stay (HOSPITAL_COMMUNITY)
Admission: EM | Admit: 2022-09-09 | Discharge: 2022-09-12 | DRG: 915 | Disposition: A | Discharge status: Home / Self Care | Payer: Medicare Other | Attending: Internal Medicine | Admitting: Internal Medicine

## 2022-09-09 ENCOUNTER — Other Ambulatory Visit: Payer: Self-pay

## 2022-09-09 DIAGNOSIS — Z96643 Presence of artificial hip joint, bilateral: Secondary | ICD-10-CM | POA: Diagnosis present

## 2022-09-09 DIAGNOSIS — R590 Localized enlarged lymph nodes: Secondary | ICD-10-CM | POA: Diagnosis present

## 2022-09-09 DIAGNOSIS — K8689 Other specified diseases of pancreas: Secondary | ICD-10-CM | POA: Diagnosis present

## 2022-09-09 DIAGNOSIS — Z981 Arthrodesis status: Secondary | ICD-10-CM

## 2022-09-09 DIAGNOSIS — K2289 Other specified disease of esophagus: Secondary | ICD-10-CM | POA: Diagnosis present

## 2022-09-09 DIAGNOSIS — Z885 Allergy status to narcotic agent status: Secondary | ICD-10-CM

## 2022-09-09 DIAGNOSIS — K831 Obstruction of bile duct: Secondary | ICD-10-CM | POA: Diagnosis present

## 2022-09-09 DIAGNOSIS — R7989 Other specified abnormal findings of blood chemistry: Secondary | ICD-10-CM | POA: Diagnosis not present

## 2022-09-09 DIAGNOSIS — F419 Anxiety disorder, unspecified: Secondary | ICD-10-CM | POA: Diagnosis not present

## 2022-09-09 DIAGNOSIS — Z79899 Other long term (current) drug therapy: Secondary | ICD-10-CM

## 2022-09-09 DIAGNOSIS — Z888 Allergy status to other drugs, medicaments and biological substances status: Secondary | ICD-10-CM

## 2022-09-09 DIAGNOSIS — I10 Essential (primary) hypertension: Secondary | ICD-10-CM | POA: Diagnosis not present

## 2022-09-09 DIAGNOSIS — E785 Hyperlipidemia, unspecified: Secondary | ICD-10-CM | POA: Diagnosis present

## 2022-09-09 DIAGNOSIS — T783XXA Angioneurotic edema, initial encounter: Principal | ICD-10-CM

## 2022-09-09 DIAGNOSIS — R748 Abnormal levels of other serum enzymes: Secondary | ICD-10-CM | POA: Diagnosis present

## 2022-09-09 DIAGNOSIS — K2270 Barrett's esophagus without dysplasia: Secondary | ICD-10-CM | POA: Diagnosis present

## 2022-09-09 DIAGNOSIS — K3189 Other diseases of stomach and duodenum: Secondary | ICD-10-CM | POA: Diagnosis present

## 2022-09-09 DIAGNOSIS — Z9841 Cataract extraction status, right eye: Secondary | ICD-10-CM

## 2022-09-09 DIAGNOSIS — X58XXXA Exposure to other specified factors, initial encounter: Secondary | ICD-10-CM | POA: Diagnosis present

## 2022-09-09 DIAGNOSIS — Z961 Presence of intraocular lens: Secondary | ICD-10-CM | POA: Diagnosis present

## 2022-09-09 DIAGNOSIS — Z886 Allergy status to analgesic agent status: Secondary | ICD-10-CM

## 2022-09-09 DIAGNOSIS — K449 Diaphragmatic hernia without obstruction or gangrene: Secondary | ICD-10-CM | POA: Diagnosis present

## 2022-09-09 DIAGNOSIS — Z818 Family history of other mental and behavioral disorders: Secondary | ICD-10-CM

## 2022-09-09 DIAGNOSIS — K219 Gastro-esophageal reflux disease without esophagitis: Secondary | ICD-10-CM | POA: Diagnosis present

## 2022-09-09 DIAGNOSIS — K828 Other specified diseases of gallbladder: Secondary | ICD-10-CM | POA: Diagnosis present

## 2022-09-09 DIAGNOSIS — Z716 Tobacco abuse counseling: Secondary | ICD-10-CM

## 2022-09-09 DIAGNOSIS — F1011 Alcohol abuse, in remission: Secondary | ICD-10-CM | POA: Diagnosis present

## 2022-09-09 DIAGNOSIS — G43909 Migraine, unspecified, not intractable, without status migrainosus: Secondary | ICD-10-CM | POA: Diagnosis present

## 2022-09-09 DIAGNOSIS — F32A Depression, unspecified: Secondary | ICD-10-CM

## 2022-09-09 DIAGNOSIS — Z9842 Cataract extraction status, left eye: Secondary | ICD-10-CM

## 2022-09-09 DIAGNOSIS — Z811 Family history of alcohol abuse and dependence: Secondary | ICD-10-CM

## 2022-09-09 DIAGNOSIS — Z8711 Personal history of peptic ulcer disease: Secondary | ICD-10-CM

## 2022-09-09 DIAGNOSIS — K861 Other chronic pancreatitis: Secondary | ICD-10-CM | POA: Diagnosis present

## 2022-09-09 DIAGNOSIS — F17210 Nicotine dependence, cigarettes, uncomplicated: Secondary | ICD-10-CM | POA: Diagnosis present

## 2022-09-09 DIAGNOSIS — C250 Malignant neoplasm of head of pancreas: Secondary | ICD-10-CM | POA: Diagnosis present

## 2022-09-09 LAB — COMPREHENSIVE METABOLIC PANEL
ALT: 132 U/L — ABNORMAL HIGH (ref 0–44)
AST: 72 U/L — ABNORMAL HIGH (ref 15–41)
Albumin: 4 g/dL (ref 3.5–5.0)
Alkaline Phosphatase: 305 U/L — ABNORMAL HIGH (ref 38–126)
Anion gap: 10 (ref 5–15)
BUN: 8 mg/dL (ref 8–23)
CO2: 20 mmol/L — ABNORMAL LOW (ref 22–32)
Calcium: 9.3 mg/dL (ref 8.9–10.3)
Chloride: 109 mmol/L (ref 98–111)
Creatinine, Ser: 0.69 mg/dL (ref 0.44–1.00)
GFR, Estimated: >60 mL/min (ref >60)
Glucose, Bld: 96 mg/dL (ref 70–99)
Potassium: 3.9 mmol/L (ref 3.5–5.1)
Sodium: 139 mmol/L (ref 135–145)
Total Bilirubin: 1.4 mg/dL — ABNORMAL HIGH (ref 0.3–1.2)
Total Protein: 7.7 g/dL (ref 6.5–8.1)

## 2022-09-09 LAB — CBC WITH DIFFERENTIAL/PLATELET
Abs Immature Granulocytes: 0.1 10*3/uL — ABNORMAL HIGH (ref 0.00–0.07)
Basophils Absolute: 0.1 10*3/uL (ref 0.0–0.1)
Basophils Relative: 1 %
Eosinophils Absolute: 0.2 10*3/uL (ref 0.0–0.5)
Eosinophils Relative: 2 %
HCT: 40.8 % (ref 36.0–46.0)
Hemoglobin: 13.1 g/dL (ref 12.0–15.0)
Immature Granulocytes: 1 %
Lymphocytes Relative: 38 %
Lymphs Abs: 3.6 10*3/uL (ref 0.7–4.0)
MCH: 31.3 pg (ref 26.0–34.0)
MCHC: 32.1 g/dL (ref 30.0–36.0)
MCV: 97.6 fL (ref 80.0–100.0)
Monocytes Absolute: 0.5 10*3/uL (ref 0.1–1.0)
Monocytes Relative: 5 %
Neutro Abs: 5.2 10*3/uL (ref 1.7–7.7)
Neutrophils Relative %: 53 %
Platelets: 214 10*3/uL (ref 150–400)
RBC: 4.18 MIL/uL (ref 3.87–5.11)
RDW: 13.9 % (ref 11.5–15.5)
WBC: 9.6 10*3/uL (ref 4.0–10.5)
nRBC: 0 % (ref 0.0–0.2)

## 2022-09-09 MED ORDER — METHYLPREDNISOLONE SODIUM SUCC 125 MG IJ SOLR
125.0000 mg | Freq: Once | INTRAMUSCULAR | Status: AC
  Administered 2022-09-09: 125 mg via INTRAVENOUS
  Filled 2022-09-09: qty 2

## 2022-09-09 MED ORDER — FAMOTIDINE IN NACL 20-0.9 MG/50ML-% IV SOLN
20.0000 mg | Freq: Once | INTRAVENOUS | Status: AC
  Administered 2022-09-09: 20 mg via INTRAVENOUS
  Filled 2022-09-09: qty 50

## 2022-09-09 MED ORDER — DIPHENHYDRAMINE HCL 50 MG/ML IJ SOLN
25.0000 mg | Freq: Once | INTRAMUSCULAR | Status: AC
  Administered 2022-09-09: 25 mg via INTRAVENOUS
  Filled 2022-09-09: qty 1

## 2022-09-09 NOTE — H&P (Addendum)
PCP:   Patient, No Pcp Per   Chief Complaint:  Swollen tongue  HPI: This is a 77 year old female resident of [facility].  She has a past medical history of HTN, anxiety and depression, tobacco use.  Today while at home patient started swelling.  She denies difficulty breathing.  She denies drooling or difficulty controlling her saliva.  She denies wheezing.  She is not short of breath.  Patient went to the nurse, patient transported to the ER.  Patient denies any new or change in medications or over-the-counter medications.  She denies not usage.  She is maintained on lisinopril for over 5 years for hypertension.  In the ER patient given IV Solu-Medrol, IV Pepcid and IV Benadryl.  Swelling has improved, overnight observation requested  Review of Systems:  Per HPI  Past Medical History: Past Medical History:  Diagnosis Date   ALCOHOL ABUSE, HX OF    ANEMIA-NOS    ANXIETY    CERVICAL RADICULOPATHY, LEFT    5 BACK OPERATIONS AND 2 NECK FUSIONS   DEGENERATIVE DISC DISEASE, CERVICAL SPINE    DEGENERATIVE DISC DISEASE, CERVICAL SPINE, W/RADICULOPATHY    Depressive disorder, not elsewhere classified    Depressive type psychosis    DIVERTICULITIS, HX OF    CLEARED ACCORDING TO PATIENT   DIZZINESS    GERD    HYPERLIPIDEMIA    HYPERTENSION    Insomnia, unspecified    LOW BACK PAIN    RUPTURED DISC   Memory loss    MIGRAINE HEADACHE 09/06/2017   NOT CURRENTLY   Palpitation    NOT A PROBLEM PER PATIENT   WEIGHT LOSS    Past Surgical History:  Procedure Laterality Date   BACK SURGERY  2008   X 5.Used her bone out of hips.   CERVICAL FUSION     X 2, [place], [place], [place]   DILATION AND CURETTAGE OF UTERUS     ESOPHAGOGASTRODUODENOSCOPY N/A 01/03/2015   Procedure: ESOPHAGOGASTRODUODENOSCOPY (EGD);  Surgeon: Elnita Maxwell, MD;  Location: [facility] ENDOSCOPY;  Service: Endoscopy;  Laterality: N/A;   EYE SURGERY Bilateral    Cataract Extraction  with IOL   HYSTEROSCOPY WITH D & C N/A 11/05/2016   Procedure: DILATATION AND CURETTAGE /HYSTEROSCOPY/MYOSURE RESECTION OF ENDOMETRIAL POLYP;  Surgeon: Schermerhorn, Ihor Austin, MD;  Location: [facility] ORS;  Service: Gynecology;  Laterality: N/A;   LUMBAR FUSION     TONSILLECTOMY     TOTAL HIP ARTHROPLASTY Right 08/04/2015   Procedure: TOTAL HIP ARTHROPLASTY ANTERIOR APPROACH;  Surgeon: Kennedy Bucker, MD;  Location: [facility] ORS;  Service: Orthopedics;  Laterality: Right;   TOTAL HIP ARTHROPLASTY Left 09/10/2017   Procedure: TOTAL HIP ARTHROPLASTY ANTERIOR APPROACH;  Surgeon: Kennedy Bucker, MD;  Location: [facility] ORS;  Service: Orthopedics;  Laterality: Left;    Medications: Prior to Admission medications   Medication Sig Start Date End Date Taking? Authorizing Provider  acetaminophen (TYLENOL) 500 MG tablet Take 500 mg by mouth every 8 (eight) hours as needed for moderate pain.   Yes [provider]  bismuth subsalicylate (PEPTO BISMOL) 262 MG/15ML suspension Take 30 mLs by mouth every 6 (six) hours as needed for indigestion or diarrhea or loose stools.   Yes [provider]  buPROPion (WELLBUTRIN XL) 300 MG 24 hr tablet Take 300 mg by mouth daily.   Yes [provider]  busPIRone (BUSPAR) 5 MG tablet Take 5 mg by mouth 2 (two) times daily.   Yes [provider]  dicyclomine (  BENTYL) 20 MG tablet Take 20 mg by mouth 3 (three) times daily before meals.   Yes [provider]  donepezil (ARICEPT) 10 MG tablet Take 10 mg by mouth at bedtime.    Yes [provider]  Eszopiclone (ESZOPICLONE) 3 MG TABS Take 3 mg by mouth at bedtime. Take immediately before bedtime   Yes [provider]  guaifenesin (ROBITUSSIN) 100 MG/5ML syrup Take 10 mLs by mouth every 6 (six) hours as needed for cough. Not to exceed 4 doses in 24 hours   Yes [provider]  lisinopril (PRINIVIL,ZESTRIL) 20 MG tablet Take 20 mg by mouth daily.   Yes [provider]   loratadine (CLARITIN) 10 MG tablet Take 10 mg by mouth daily.   Yes [provider]  omeprazole (PRILOSEC) 20 MG capsule Take 20 mg by mouth 2 (two) times daily before a meal.   Yes [provider]  ondansetron (ZOFRAN-ODT) 8 MG disintegrating tablet Take 8 mg by mouth every 6 (six) hours as needed for nausea or vomiting.   Yes [provider]  pregabalin (LYRICA) 100 MG capsule Take 100 mg by mouth 3 (three) times daily.   Yes [provider]  senna (SENOKOT) 8.6 MG TABS tablet Take 1 tablet by mouth daily.   Yes [provider]  sertraline (ZOLOFT) 50 MG tablet Take 50 mg by mouth every evening.   Yes [provider]  traZODone (DESYREL) 50 MG tablet Take 50 mg by mouth at bedtime.   Yes [provider]    Allergies:   Allergies  Allergen Reactions   Codeine Itching    Other Reaction(s): Not available   Atorvastatin Other (See Comments)    Unknown; not listed on MAR   Aspirin Other (See Comments)    Reaction: stomach ache  Other Reaction(s): Not available   Morphine Other (See Comments)    "severe headache"  Other Reaction(s): Not available   Nsaids     STOMACH PAINS    Social History:  reports that she has been smoking cigarettes. She has never used smokeless tobacco. She reports that she does not drink alcohol and does not use drugs.  Family History: Family History  Problem Relation Age of Onset   Alcohol abuse Father    Depression Other    Alcohol abuse Other     Physical Exam: Vitals:   09/09/22 2200 09/09/22 2215 09/09/22 2230 09/09/22 2300  BP: (!) 203/99 (!) 196/95  (!) 142/131  Pulse: 69 73 67 64  Resp: 15 18 20 18   Temp:      SpO2: 97% 98% 99% 99%    General:  Alert and oriented times three, well developed and nourished, no acute distress Eyes: PERRLA, pink conjunctiva, no scleral icterus ENT: Moist oral mucosa, neck supple, no thyromegaly Lungs: clear to ascultation, no wheeze, no  crackles, no use of accessory muscles Cardiovascular: regular rate and rhythm, no regurgitation, no gallops, no murmurs. No carotid bruits, no JVD Abdomen: soft, positive BS, non-tender, non-distended, no organomegaly, not an acute abdomen GU: not examined Neuro: CN II - XII grossly intact, sensation intact Musculoskeletal: strength 5/5 all extremities, no clubbing, cyanosis or edema Skin: no rash, no subcutaneous crepitation, no decubitus Psych: appropriate patient   Labs on Admission:  Recent Labs    09/09/22 2219  NA 139  K 3.9  CL 109  CO2 20*  GLUCOSE 96  BUN 8  CREATININE 0.69  CALCIUM 9.3   Recent Labs  09/09/22 2219  AST 72*  ALT 132*  ALKPHOS 305*  BILITOT 1.4*  PROT 7.7  ALBUMIN 4.0    Recent Labs    09/09/22 2219  WBC 9.6  NEUTROABS 5.2  HGB 13.1  HCT 40.8  MCV 97.6  PLT 214    Radiological Exams on Admission: No results found.  Assessment/Plan Present on Admission:  Angioedema -On lisinopril.  This has been held -Continue Benadryl 25 mg p.o. every 8 hours, Pepcid 20 mg IV every 12 hours, prednisone p.o. 20 mg p.o. twice daily   elevated LFT's -Patient history of alcohol abuse.  Will check right upper quadrant ultrasound.  Hepatitis panel. -CMP in a.m.   Anxiety and depression -Patient's Wellbutrin XL and trazodone resumed  HTN -Norvasc initiated 90 mg daily -Hydralazine as needed   Tobacco use -Nicotine patch ordered   H/o alcohol abuse  Santiaga Butzin 09/09/2022, 11:39 PM

## 2022-09-09 NOTE — ED Provider Notes (Signed)
[place] EMERGENCY DEPARTMENT AT [facility] Provider Note   CSN: [id number] Arrival date & time: 09/09/22  2049     History  Chief Complaint  Patient presents with   Allergic Reaction    [name] is a 77 y.o. female.   Allergic Reaction 77 year old female history of migraines, GERD, hyperlipidemia, hypertension presenting for tongue swelling.  She states short while ago she developed swelling to her tongue and right submandibular region.  It initially worsened and was difficult to talk and swallow.  It is improved slightly.  She currently has no shortness of breath but does feel like her tongue is swollen on the right side.  She is never had this before.  No new exposures no history of allergies.  She has no pain.  She not had any fevers or chills or sore throat.  She is on lisinopril with this is not a new medication.  No family history of edema.     Home Medications Prior to Admission medications   Medication Sig Start Date End Date Taking? Authorizing Provider  acetaminophen (TYLENOL) 500 MG tablet Take 500 mg by mouth every 8 (eight) hours as needed for moderate pain.   Yes [provider]  bismuth subsalicylate (PEPTO BISMOL) 262 MG/15ML suspension Take 30 mLs by mouth every 6 (six) hours as needed for indigestion or diarrhea or loose stools.   Yes [provider]  buPROPion (WELLBUTRIN XL) 300 MG 24 hr tablet Take 300 mg by mouth daily.   Yes [provider]  busPIRone (BUSPAR) 5 MG tablet Take 5 mg by mouth 2 (two) times daily.   Yes [provider]  dicyclomine (BENTYL) 20 MG tablet Take 20 mg by mouth 3 (three) times daily before meals.   Yes [provider]  donepezil (ARICEPT) 10 MG tablet Take 10 mg by mouth at bedtime.    Yes [provider]  Eszopiclone (ESZOPICLONE) 3 MG TABS Take 3 mg by mouth at bedtime. Take immediately before bedtime   Yes [provider]  guaifenesin (ROBITUSSIN) 100  MG/5ML syrup Take 10 mLs by mouth every 6 (six) hours as needed for cough. Not to exceed 4 doses in 24 hours   Yes [provider]  lisinopril (PRINIVIL,ZESTRIL) 20 MG tablet Take 20 mg by mouth daily.   Yes [provider]  loratadine (CLARITIN) 10 MG tablet Take 10 mg by mouth daily.   Yes [provider]  omeprazole (PRILOSEC) 20 MG capsule Take 20 mg by mouth 2 (two) times daily before a meal.   Yes [provider]  ondansetron (ZOFRAN-ODT) 8 MG disintegrating tablet Take 8 mg by mouth every 6 (six) hours as needed for nausea or vomiting.   Yes [provider]  pregabalin (LYRICA) 100 MG capsule Take 100 mg by mouth 3 (three) times daily.   Yes [provider]  senna (SENOKOT) 8.6 MG TABS tablet Take 1 tablet by mouth daily.   Yes [provider]  sertraline (ZOLOFT) 50 MG tablet Take 50 mg by mouth every evening.   Yes [provider]  traZODone (DESYREL) 50 MG tablet Take 50 mg by mouth at bedtime.   Yes [provider]      Allergies    Codeine, Atorvastatin, Aspirin, Morphine, and Nsaids    Review of Systems   Review of Systems Review of systems completed and notable as per HPI.  ROS otherwise negative.  Physical Exam Updated Vital Signs BP (!) 142/131  Pulse 64   Temp 98.7 F (37.1 C)   Resp 18   SpO2 99%  Physical Exam Vitals and nursing note reviewed.  Constitutional:      General: She is not in acute distress.    Appearance: She is well-developed.  HENT:     Head: Normocephalic and atraumatic.     Mouth/Throat:     Mouth: Mucous membranes are moist.     Pharynx: No oropharyngeal exudate or posterior oropharyngeal erythema.     Comments: Patient has swelling of the right side of the tongue without erythema.  Uvula is somewhat difficult to assess but appears midline.  She has no stridor and no difficulty breathing.  She has no tenderness of the submandibular region or skin changes. Eyes:      Extraocular Movements: Extraocular movements intact.     Conjunctiva/sclera: Conjunctivae normal.     Pupils: Pupils are equal, round, and reactive to light.  Cardiovascular:     Rate and Rhythm: Normal rate and regular rhythm.     Heart sounds: No murmur heard. Pulmonary:     Effort: Pulmonary effort is normal. No respiratory distress.     Breath sounds: Normal breath sounds.  Abdominal:     Palpations: Abdomen is soft.     Tenderness: There is no abdominal tenderness.  Musculoskeletal:        General: No swelling.     Cervical back: Normal range of motion and neck supple. No rigidity or tenderness.  Skin:    General: Skin is warm and dry.     Capillary Refill: Capillary refill takes less than 2 seconds.  Neurological:     Mental Status: She is alert.  Psychiatric:        Mood and Affect: Mood normal.        ED Results / Procedures / Treatments   Labs (all labs ordered are listed, but only abnormal results are displayed) Labs Reviewed  CBC WITH DIFFERENTIAL/PLATELET - Abnormal; Notable for the following components:      Result Value   Abs Immature Granulocytes 0.10 (*)    All other components within normal limits  COMPREHENSIVE METABOLIC PANEL - Abnormal; Notable for the following components:   CO2 20 (*)    AST 72 (*)    ALT 132 (*)    Alkaline Phosphatase 305 (*)    Total Bilirubin 1.4 (*)    All other components within normal limits    EKG None  Radiology No results found.  Procedures Procedures    Medications Ordered in ED Medications  diphenhydrAMINE (BENADRYL) injection 25 mg (25 mg Intravenous Given 09/09/22 2212)  methylPREDNISolone sodium succinate (SOLU-MEDROL) 125 mg/2 mL injection 125 mg (125 mg Intravenous Given 09/09/22 2210)  famotidine (PEPCID) IVPB 20 mg premix (0 mg Intravenous Stopped 09/09/22 2303)    ED Course/ Medical Decision Making/ A&P                             Medical Decision Making Amount and/or Complexity of Data  Reviewed Labs: ordered.  Risk Prescription drug management. Decision regarding hospitalization.   Medical Decision Making:   [name] is a 77 y.o. female who presented to the ED today with tongue swelling.  Vital signs reviewed notable for hypertension.  Exam she has significant swelling to the right side of her tongue.  I am concerned for angioedema possibly mediated with ACE inhibitor.  She has no other allergic symptoms  and no symptoms of anaphylaxis, however will trial Benadryl, steroids, famotidine to see if this helps.  Will plan for observation in the ED.  Right now she is maintaining her airway and I do not think there is an indication for intubation especially with improving symptoms.   Patient placed on continuous vitals and telemetry monitoring while in ED which was reviewed periodically.  Reviewed and confirmed nursing documentation for past medical history, family history, social history.  Initial Study Results:   Laboratory  All laboratory results reviewed.  Labs notable for CBC unremarkable.  CMP notable for mild transaminitis and elevated alk phos.    Consults: Case discussed with hospitalist.   Reassessment and Plan:   On reassessment, patient continues to have improvement.  Her swelling is better and her speech is better as well.  I do not think there is any airway compromise at this time.  Given significant improvement in symptoms and presentation consistent with angioedema patient discussed with hospitalist admitted for further management.  Patient was comfortable this plan.   Patient's presentation is most consistent with acute presentation with potential threat to life or bodily function.           Final Clinical Impression(s) / ED Diagnoses Final diagnoses:  Angioedema, initial encounter    Rx / DC Orders ED Discharge Orders     None         Laurence Spates, MD 09/09/22 2340

## 2022-09-09 NOTE — ED Triage Notes (Signed)
Pt. Arrives POV with daughter c/o an allergic rxn. Pt. Is experiencing tongue swelling and neck swelling. Pt. Is unsure what caused her reaction. Pt. Also take lisinopril daily for HTN.

## 2022-09-09 NOTE — ED Notes (Signed)
Assumed care of pt, found her in bed w/ daughter and provider bedside.  Pt reports about 7pm she began to feel her lips and tong start to swell.  She states that she had to write a note to the facility employee to tell her what the problem was.  She states she was unable to swallow the PO medication that they gave her.  Upon arrival pt's voice was very hard to understand, words were slurred.  She did not appear to be in any respiratory distress at this time.

## 2022-09-10 ENCOUNTER — Observation Stay (HOSPITAL_COMMUNITY): Payer: Medicare Other

## 2022-09-10 ENCOUNTER — Encounter (HOSPITAL_COMMUNITY): Payer: Self-pay | Admitting: Family Medicine

## 2022-09-10 DIAGNOSIS — T783XXA Angioneurotic edema, initial encounter: Secondary | ICD-10-CM | POA: Diagnosis not present

## 2022-09-10 LAB — COMPREHENSIVE METABOLIC PANEL
ALT: 130 U/L — ABNORMAL HIGH (ref 0–44)
AST: 74 U/L — ABNORMAL HIGH (ref 15–41)
Albumin: 3.9 g/dL (ref 3.5–5.0)
Alkaline Phosphatase: 320 U/L — ABNORMAL HIGH (ref 38–126)
Anion gap: 9 (ref 5–15)
BUN: 10 mg/dL (ref 8–23)
CO2: 24 mmol/L (ref 22–32)
Calcium: 9.5 mg/dL (ref 8.9–10.3)
Chloride: 105 mmol/L (ref 98–111)
Creatinine, Ser: 0.7 mg/dL (ref 0.44–1.00)
GFR, Estimated: >60 mL/min (ref >60)
Glucose, Bld: 192 mg/dL — ABNORMAL HIGH (ref 70–99)
Potassium: 4.1 mmol/L (ref 3.5–5.1)
Sodium: 138 mmol/L (ref 135–145)
Total Bilirubin: 1.2 mg/dL (ref 0.3–1.2)
Total Protein: 7.8 g/dL (ref 6.5–8.1)

## 2022-09-10 LAB — CBC WITH DIFFERENTIAL/PLATELET
Abs Immature Granulocytes: 0.08 10*3/uL — ABNORMAL HIGH (ref 0.00–0.07)
Basophils Absolute: 0.1 10*3/uL (ref 0.0–0.1)
Basophils Relative: 1 %
Eosinophils Absolute: 0 10*3/uL (ref 0.0–0.5)
Eosinophils Relative: 0 %
HCT: 43.2 % (ref 36.0–46.0)
Hemoglobin: 13.6 g/dL (ref 12.0–15.0)
Immature Granulocytes: 1 %
Lymphocytes Relative: 18 %
Lymphs Abs: 1.7 10*3/uL (ref 0.7–4.0)
MCH: 31.1 pg (ref 26.0–34.0)
MCHC: 31.5 g/dL (ref 30.0–36.0)
MCV: 98.9 fL (ref 80.0–100.0)
Monocytes Absolute: 0.1 10*3/uL (ref 0.1–1.0)
Monocytes Relative: 1 %
Neutro Abs: 7.5 10*3/uL (ref 1.7–7.7)
Neutrophils Relative %: 79 %
Platelets: 221 10*3/uL (ref 150–400)
RBC: 4.37 MIL/uL (ref 3.87–5.11)
RDW: 13.8 % (ref 11.5–15.5)
WBC: 9.4 10*3/uL (ref 4.0–10.5)
nRBC: 0 % (ref 0.0–0.2)

## 2022-09-10 LAB — HEPATITIS PANEL, ACUTE
HCV Ab: NONREACTIVE
Hep A IgM: NONREACTIVE
Hep B C IgM: NONREACTIVE
Hepatitis B Surface Ag: NONREACTIVE

## 2022-09-10 MED ORDER — FAMOTIDINE IN NACL 20-0.9 MG/50ML-% IV SOLN
20.0000 mg | Freq: Two times a day (BID) | INTRAVENOUS | Status: DC
  Administered 2022-09-10 – 2022-09-12 (×5): 20 mg via INTRAVENOUS
  Filled 2022-09-10 (×5): qty 50

## 2022-09-10 MED ORDER — PREGABALIN 50 MG PO CAPS
100.0000 mg | ORAL_CAPSULE | Freq: Three times a day (TID) | ORAL | Status: DC
  Administered 2022-09-10 – 2022-09-12 (×7): 100 mg via ORAL
  Filled 2022-09-10 (×7): qty 2

## 2022-09-10 MED ORDER — AMLODIPINE BESYLATE 10 MG PO TABS
10.0000 mg | ORAL_TABLET | Freq: Every day | ORAL | Status: DC
  Administered 2022-09-10 – 2022-09-12 (×3): 10 mg via ORAL
  Filled 2022-09-10 (×4): qty 1

## 2022-09-10 MED ORDER — SERTRALINE HCL 50 MG PO TABS
50.0000 mg | ORAL_TABLET | Freq: Every evening | ORAL | Status: DC
  Administered 2022-09-10 – 2022-09-11 (×2): 50 mg via ORAL
  Filled 2022-09-10 (×3): qty 1

## 2022-09-10 MED ORDER — DIPHENHYDRAMINE HCL 50 MG/ML IJ SOLN
25.0000 mg | Freq: Once | INTRAMUSCULAR | Status: AC
  Administered 2022-09-10: 25 mg via INTRAVENOUS
  Filled 2022-09-10: qty 1

## 2022-09-10 MED ORDER — PREDNISONE 20 MG PO TABS
40.0000 mg | ORAL_TABLET | Freq: Every day | ORAL | Status: DC
  Administered 2022-09-10 – 2022-09-12 (×2): 40 mg via ORAL
  Filled 2022-09-10 (×2): qty 2

## 2022-09-10 MED ORDER — DIPHENHYDRAMINE HCL 25 MG PO CAPS
25.0000 mg | ORAL_CAPSULE | Freq: Three times a day (TID) | ORAL | Status: AC
  Administered 2022-09-10 (×3): 25 mg via ORAL
  Filled 2022-09-10 (×3): qty 1

## 2022-09-10 MED ORDER — BUSPIRONE HCL 5 MG PO TABS
5.0000 mg | ORAL_TABLET | Freq: Two times a day (BID) | ORAL | Status: DC
  Administered 2022-09-10 – 2022-09-12 (×6): 5 mg via ORAL
  Filled 2022-09-10 (×6): qty 1

## 2022-09-10 MED ORDER — ACETAMINOPHEN 325 MG PO TABS
650.0000 mg | ORAL_TABLET | Freq: Four times a day (QID) | ORAL | Status: DC | PRN
  Administered 2022-09-10 – 2022-09-12 (×5): 650 mg via ORAL
  Filled 2022-09-10 (×5): qty 2

## 2022-09-10 MED ORDER — ENOXAPARIN SODIUM 40 MG/0.4ML IJ SOSY
40.0000 mg | PREFILLED_SYRINGE | INTRAMUSCULAR | Status: DC
  Administered 2022-09-10 – 2022-09-12 (×2): 40 mg via SUBCUTANEOUS
  Filled 2022-09-10 (×2): qty 0.4

## 2022-09-10 MED ORDER — ONDANSETRON HCL 4 MG/2ML IJ SOLN
4.0000 mg | Freq: Four times a day (QID) | INTRAMUSCULAR | Status: DC | PRN
  Administered 2022-09-10: 4 mg via INTRAVENOUS
  Filled 2022-09-10: qty 2

## 2022-09-10 MED ORDER — SENNOSIDES-DOCUSATE SODIUM 8.6-50 MG PO TABS: 1.0000 | ORAL_TABLET | Freq: Every evening | ORAL | Status: DC | PRN

## 2022-09-10 MED ORDER — DONEPEZIL HCL 10 MG PO TABS
10.0000 mg | ORAL_TABLET | Freq: Every day | ORAL | Status: DC
  Administered 2022-09-10 – 2022-09-11 (×2): 10 mg via ORAL
  Filled 2022-09-10 (×2): qty 1

## 2022-09-10 MED ORDER — METOPROLOL TARTRATE 5 MG/5ML IV SOLN
10.0000 mg | INTRAVENOUS | Status: DC | PRN
  Administered 2022-09-10: 10 mg via INTRAVENOUS
  Filled 2022-09-10: qty 10

## 2022-09-10 MED ORDER — LORATADINE 10 MG PO TABS
10.0000 mg | ORAL_TABLET | Freq: Every day | ORAL | Status: DC
  Administered 2022-09-10 – 2022-09-12 (×3): 10 mg via ORAL
  Filled 2022-09-10 (×3): qty 1

## 2022-09-10 MED ORDER — ACETAMINOPHEN 325 MG PO TABS
650.0000 mg | ORAL_TABLET | Freq: Once | ORAL | Status: AC
  Administered 2022-09-10: 650 mg via ORAL
  Filled 2022-09-10: qty 2

## 2022-09-10 MED ORDER — NICOTINE 14 MG/24HR TD PT24: 14.0000 mg | MEDICATED_PATCH | Freq: Every day | TRANSDERMAL | Status: DC | PRN

## 2022-09-10 MED ORDER — GADOBUTROL 1 MMOL/ML IV SOLN
10.0000 mL | Freq: Once | INTRAVENOUS | Status: AC | PRN
  Administered 2022-09-10: 10 mL via INTRAVENOUS

## 2022-09-10 MED ORDER — ALPRAZOLAM 0.25 MG PO TABS
0.2500 mg | ORAL_TABLET | Freq: Two times a day (BID) | ORAL | Status: DC | PRN
  Administered 2022-09-10: 0.25 mg via ORAL
  Filled 2022-09-10: qty 1

## 2022-09-10 MED ORDER — BUPROPION HCL ER (XL) 300 MG PO TB24
300.0000 mg | ORAL_TABLET | Freq: Every day | ORAL | Status: DC
  Administered 2022-09-10 – 2022-09-12 (×3): 300 mg via ORAL
  Filled 2022-09-10 (×3): qty 1

## 2022-09-10 MED ORDER — FAMOTIDINE IN NACL 20-0.9 MG/50ML-% IV SOLN: 20.0000 mg | Freq: Two times a day (BID) | INTRAVENOUS | Status: DC

## 2022-09-10 MED ORDER — TRAZODONE HCL 50 MG PO TABS
50.0000 mg | ORAL_TABLET | Freq: Every day | ORAL | Status: DC
  Administered 2022-09-10 – 2022-09-11 (×2): 50 mg via ORAL
  Filled 2022-09-10 (×2): qty 1

## 2022-09-10 MED ORDER — ALBUTEROL SULFATE (2.5 MG/3ML) 0.083% IN NEBU: 2.5000 mg | INHALATION_SOLUTION | RESPIRATORY_TRACT | Status: DC | PRN

## 2022-09-10 MED ORDER — ONDANSETRON HCL 4 MG PO TABS: 4.0000 mg | ORAL_TABLET | Freq: Four times a day (QID) | ORAL | Status: DC | PRN

## 2022-09-10 MED ORDER — DICYCLOMINE HCL 20 MG PO TABS
20.0000 mg | ORAL_TABLET | Freq: Three times a day (TID) | ORAL | Status: DC
  Administered 2022-09-10 – 2022-09-12 (×6): 20 mg via ORAL
  Filled 2022-09-10 (×6): qty 1

## 2022-09-10 MED ORDER — BISMUTH SUBSALICYLATE 262 MG/15ML PO SUSP: 30.0000 mL | Freq: Four times a day (QID) | ORAL | Status: DC | PRN

## 2022-09-10 NOTE — Consult Note (Addendum)
[facility] Consult  Referring Provider: No ref. provider found Primary Care Physician:  Patient, No Pcp Per Primary Gastroenterologist: Unassigned  Reason for Consultation: Abnormal imaging, elevated liver chemistries  SUBJECTIVE:   HPI: [name] is a 77 y.o. female with past medical history significant for hypertension, hyperlipidemia, gastroesophageal reflux disease, personal history of peptic ulcer disease. Underwent EGD on 01/03/2015 through [facility] (Dr. Shelle Iron) with findings of likely healed ulcer bed in the gastric antrum and duodenum. She noted that she has been on omeprazole for LUQ abdominal pain in the recent past. She underwent colonoscopy on 08/18/2013 by Dr. Lina Sar with findings of cecum lipoma, sigmoid diverticulosis.   She presented to hospital on 09/09/22 with chief complaint of tongue swelling, has been treated for angioedema and doing well. She was found to have elevated liver enzymes in mixed pattern, AST/ALT 74/130, ALP 320, total bilirubin 1.2. Abdominal ultrasound completed 09/10/22 showed distended gallbladder without stones, dilated common bile duct (15 mm) and intrahepatic dilatation, hepatic steatosis.   She noted having some LUQ abdominal pain. No nausea or vomiting. Had issues with talking and speech with tongue swelling, no shortness of breath or cough. No chest pain. No change in bowel habits, no blood per rectum.   No known family history of GI malignancy or biliary disease.  No prior gastric bypass.   Past Medical History:  Diagnosis Date   ALCOHOL ABUSE, HX OF    ANEMIA-NOS    ANXIETY    CERVICAL RADICULOPATHY, LEFT    5 BACK OPERATIONS AND 2 NECK FUSIONS   DEGENERATIVE DISC DISEASE, CERVICAL SPINE    DEGENERATIVE DISC DISEASE, CERVICAL SPINE, W/RADICULOPATHY    Depressive disorder, not elsewhere classified    Depressive type psychosis    DIVERTICULITIS, HX OF    CLEARED ACCORDING TO PATIENT   DIZZINESS    GERD     HYPERLIPIDEMIA    HYPERTENSION    Insomnia, unspecified    LOW BACK PAIN    RUPTURED DISC   Memory loss    MIGRAINE HEADACHE 09/06/2017   NOT CURRENTLY   Palpitation    NOT A PROBLEM PER PATIENT   WEIGHT LOSS    Past Surgical History:  Procedure Laterality Date   BACK SURGERY  2008   X 5.Used her bone out of hips.   CERVICAL FUSION     X 2, [facility], [place], [place]   DILATION AND CURETTAGE OF UTERUS     ESOPHAGOGASTRODUODENOSCOPY N/A 01/03/2015   Procedure: ESOPHAGOGASTRODUODENOSCOPY (EGD);  Surgeon: Elnita Maxwell, MD;  Location: [facility] ENDOSCOPY;  Service: Endoscopy;  Laterality: N/A;   EYE SURGERY Bilateral    Cataract Extraction with IOL   HYSTEROSCOPY WITH D & C N/A 11/05/2016   Procedure: DILATATION AND CURETTAGE /HYSTEROSCOPY/MYOSURE RESECTION OF ENDOMETRIAL POLYP;  Surgeon: Schermerhorn, Ihor Austin, MD;  Location: [facility] ORS;  Service: Gynecology;  Laterality: N/A;   LUMBAR FUSION     TONSILLECTOMY     TOTAL HIP ARTHROPLASTY Right 08/04/2015   Procedure: TOTAL HIP ARTHROPLASTY ANTERIOR APPROACH;  Surgeon: Kennedy Bucker, MD;  Location: [facility] ORS;  Service: Orthopedics;  Laterality: Right;   TOTAL HIP ARTHROPLASTY Left 09/10/2017   Procedure: TOTAL HIP ARTHROPLASTY ANTERIOR APPROACH;  Surgeon: Kennedy Bucker, MD;  Location: [facility] ORS;  Service: Orthopedics;  Laterality: Left;   Prior to Admission medications   Medication Sig Start Date End Date Taking? Authorizing Provider  acetaminophen (TYLENOL) 500 MG tablet Take 500 mg by mouth every 8 (eight) hours  as needed for moderate pain.   Yes [provider]  bismuth subsalicylate (PEPTO BISMOL) 262 MG/15ML suspension Take 30 mLs by mouth every 6 (six) hours as needed for indigestion or diarrhea or loose stools.   Yes [provider]  buPROPion (WELLBUTRIN XL) 300 MG 24 hr tablet Take 300 mg by mouth daily.   Yes [provider]  busPIRone (BUSPAR) 5 MG tablet Take 5 mg by mouth 2 (two) times daily.   Yes  [provider]  dicyclomine (BENTYL) 20 MG tablet Take 20 mg by mouth 3 (three) times daily before meals.   Yes [provider]  donepezil (ARICEPT) 10 MG tablet Take 10 mg by mouth at bedtime.    Yes [provider]  Eszopiclone (ESZOPICLONE) 3 MG TABS Take 3 mg by mouth at bedtime. Take immediately before bedtime   Yes [provider]  guaifenesin (ROBITUSSIN) 100 MG/5ML syrup Take 10 mLs by mouth every 6 (six) hours as needed for cough. Not to exceed 4 doses in 24 hours   Yes [provider]  lisinopril (PRINIVIL,ZESTRIL) 20 MG tablet Take 20 mg by mouth daily.   Yes [provider]  loratadine (CLARITIN) 10 MG tablet Take 10 mg by mouth daily.   Yes [provider]  omeprazole (PRILOSEC) 20 MG capsule Take 20 mg by mouth 2 (two) times daily before a meal.   Yes [provider]  ondansetron (ZOFRAN-ODT) 8 MG disintegrating tablet Take 8 mg by mouth every 6 (six) hours as needed for nausea or vomiting.   Yes [provider]  pregabalin (LYRICA) 100 MG capsule Take 100 mg by mouth 3 (three) times daily.   Yes [provider]  senna (SENOKOT) 8.6 MG TABS tablet Take 1 tablet by mouth daily.   Yes [provider]  sertraline (ZOLOFT) 50 MG tablet Take 50 mg by mouth every evening.   Yes [provider]  traZODone (DESYREL) 50 MG tablet Take 50 mg by mouth at bedtime.   Yes [provider]   Current Facility-Administered Medications  Medication Dose Route Frequency Provider Last Rate Last Admin   acetaminophen (TYLENOL) tablet 650 mg  650 mg Oral Q6H PRN Dahal, Binaya, MD   650 mg at 09/10/22 0836   albuterol (PROVENTIL) (2.5 MG/3ML) 0.083% nebulizer solution 2.5 mg  2.5 mg Nebulization Q4H PRN Crosley, Debby, MD       ALPRAZolam Prudy Feeler) tablet 0.25 mg  0.25 mg Oral BID PRN Dahal, Melina Schools, MD   0.25 mg at 09/10/22 1308   amLODipine (NORVASC) tablet 10 mg  10 mg Oral Daily Crosley,  Debby, MD   10 mg at 09/10/22 0513   bismuth subsalicylate (PEPTO BISMOL) 262 MG/15ML suspension 30 mL  30 mL Oral Q6H PRN Crosley, Debby, MD       buPROPion (WELLBUTRIN XL) 24 hr tablet 300 mg  300 mg Oral Daily Crosley, Debby, MD   300 mg at 09/10/22 0839   busPIRone (BUSPAR) tablet 5 mg  5 mg Oral BID Crosley, Debby, MD   5 mg at 09/10/22 0838   dicyclomine (BENTYL) tablet 20 mg  20 mg Oral TID AC Crosley, Debby, MD   20 mg at 09/10/22 1307   diphenhydrAMINE (BENADRYL) capsule 25 mg  25 mg Oral Q8H Crosley, Debby, MD   25 mg at 09/10/22 1308   donepezil (ARICEPT) tablet 10 mg  10 mg Oral QHS Crosley, Debby, MD       enoxaparin (LOVENOX) injection 40  mg  40 mg Subcutaneous Q24H Crosley, Debby, MD   40 mg at 09/10/22 0838   famotidine (PEPCID) IVPB 20 mg premix  20 mg Intravenous Q12H Crosley, Debby, MD 100 mL/hr at 09/10/22 0516 20 mg at 09/10/22 0516   loratadine (CLARITIN) tablet 10 mg  10 mg Oral Daily Crosley, Debby, MD   10 mg at 09/10/22 0837   metoprolol tartrate (LOPRESSOR) injection 10 mg  10 mg Intravenous Q4H PRN Crosley, Debby, MD   10 mg at 09/10/22 0130   nicotine (NICODERM CQ - dosed in mg/24 hours) patch 14 mg  14 mg Transdermal Daily PRN Crosley, Debby, MD       ondansetron (ZOFRAN) tablet 4 mg  4 mg Oral Q6H PRN Crosley, Debby, MD       Or   ondansetron (ZOFRAN) injection 4 mg  4 mg Intravenous Q6H PRN Joneen Roach, Debby, MD   4 mg at 09/10/22 1914   predniSONE (DELTASONE) tablet 40 mg  40 mg Oral Q breakfast Crosley, Debby, MD   40 mg at 09/10/22 0836   pregabalin (LYRICA) capsule 100 mg  100 mg Oral TID Gery Pray, MD   100 mg at 09/10/22 7829   senna-docusate (Senokot-S) tablet 1 tablet  1 tablet Oral QHS PRN Crosley, Debby, MD       sertraline (ZOLOFT) tablet 50 mg  50 mg Oral QPM Crosley, Debby, MD       traZODone (DESYREL) tablet 50 mg  50 mg Oral QHS Gery Pray, MD       Allergies as of 09/09/2022 - Review Complete 09/09/2022  Allergen Reaction Noted   Codeine  Itching 09/19/2006   Atorvastatin Other (See Comments) 09/19/2006   Aspirin Other (See Comments) 11/29/2015   Morphine Other (See Comments) 09/19/2006   Nsaids  09/06/2017   Family History  Problem Relation Age of Onset   Alcohol abuse Father    Depression Other    Alcohol abuse Other    Social History   Socioeconomic History   Marital status: Widowed    Spouse name: Not on file   Number of children: Not on file   Years of education: Not on file   Highest education level: Not on file  Occupational History   Not on file  Tobacco Use   Smoking status: Every Day    Current packs/day: 0.50    Types: Cigarettes   Smokeless tobacco: Never  Vaping Use   Vaping status: Former  Substance and Sexual Activity   Alcohol use: No   Drug use: No   Sexual activity: Never  Other Topics Concern   Not on file  Social History Narrative   Not on file   Social Determinants of Health   Financial Resource Strain: Not on file  Food Insecurity: No Food Insecurity (09/10/2022)   Hunger Vital Sign    Worried About Running Out of Food in the Last Year: Never true    Ran Out of Food in the Last Year: Never true  Transportation Needs: No Transportation Needs (09/10/2022)   PRAPARE - Administrator, Civil Service (Medical): No    Lack of Transportation (Non-Medical): No  Physical Activity: Not on file  Stress: Not on file  Social Connections: Not on file  Intimate Partner Violence: Not At Risk (09/10/2022)   Humiliation, Afraid, Rape, and Kick questionnaire    Fear of Current or Ex-Partner: No    Emotionally Abused: No    Physically Abused: No    Sexually  Abused: No   Review of Systems:  Review of Systems  Respiratory:  Negative for shortness of breath.   Cardiovascular:  Negative for chest pain.  Gastrointestinal:  Positive for abdominal pain. Negative for blood in stool, constipation, diarrhea, nausea and vomiting.    OBJECTIVE:   Temp:  [97.5 F (36.4 C)-98.7 F (37.1  C)] 98 F (36.7 C) (07/15 1352) Pulse Rate:  [64-86] 73 (07/15 0745) Resp:  [14-27] 18 (07/15 1352) BP: (131-214)/(64-144) 151/119 (07/15 1352) SpO2:  [94 %-100 %] 94 % (07/15 1352) Weight:  [97.1 kg] 97.1 kg (07/15 0038) Last BM Date : 09/09/22 Physical Exam Constitutional:      General: She is not in acute distress.    Appearance: She is not ill-appearing, toxic-appearing or diaphoretic.     Comments: Somewhat tearful during conversation about abnormal imaging findings.  Cardiovascular:     Rate and Rhythm: Normal rate and regular rhythm.  Pulmonary:     Effort: No respiratory distress.     Breath sounds: Normal breath sounds.  Abdominal:     General: Bowel sounds are normal. There is no distension.     Palpations: Abdomen is soft.     Tenderness: There is no abdominal tenderness. There is no guarding.  Musculoskeletal:     Right lower leg: No edema.     Left lower leg: No edema.  Skin:    General: Skin is warm and dry.  Neurological:     Mental Status: She is alert.     Labs: Recent Labs    09/09/22 2219 09/10/22 0450  WBC 9.6 9.4  HGB 13.1 13.6  HCT 40.8 43.2  PLT 214 221   BMET Recent Labs    09/09/22 2219 09/10/22 0450  NA 139 138  K 3.9 4.1  CL 109 105  CO2 20* 24  GLUCOSE 96 192*  BUN 8 10  CREATININE 0.69 0.70  CALCIUM 9.3 9.5   LFT Recent Labs    09/10/22 0450  PROT 7.8  ALBUMIN 3.9  AST 74*  ALT 130*  ALKPHOS 320*  BILITOT 1.2   PT/INR No results for input(s): "LABPROT", "INR" in the last 72 hours.  Diagnostic imaging: US Abdomen Limited RUQ (LIVER/GB)  Result Date: 09/10/2022 CLINICAL DATA:  Elevated LFTs. EXAM: ULTRASOUND ABDOMEN LIMITED RIGHT UPPER QUADRANT COMPARISON:  CT abdomen 07/18/1999 FINDINGS: Gallbladder: Moderate distention of the gallbladder without wall thickening or gallstones. Reportedly, no sonographic Murphy sign. Common bile duct: Diameter: 1.5 cm Liver: Increased echogenicity. No focal lesion. At least mild  intrahepatic biliary dilatation. Liver is heterogeneous. Portal vein is patent on color Doppler imaging with normal direction of blood flow towards the liver. Other: None. IMPRESSION: 1. Distended gallbladder without gallstones. 2. Dilated common bile duct and intrahepatic biliary dilatation. Findings concerning for a biliary obstruction. Recommend further evaluation with MRCP. 3. Increased echogenicity of the liver is suggestive for hepatic steatosis. Electronically Signed   By: Richarda Overlie M.D.   On: 09/10/2022 12:10    IMPRESSION: Transaminase elevation in mixed pattern Dilated common bile duct Dilated intrahepatic bile duct History of peptic ulcer disease Mild LUQ pain   Other: HTN, HLD  PLAN: -Await MRCP results -Above findings will dictate further workup including any serologic testing -Trend liver enzyme panel  -Your IM care -[facility] will follow   LOS: 0 days   Liliane Shi, [facility]  Update [phone] -Received MRCP results showing pancreatic head lesion concerning for pancreatic cancer, discussed these findings with patient and  her adult children at bedside today. They asked appropriate questions. Informed them that next step would be upper endoscopy with endoscopic ultrasound for tissue biopsy and diagnosis. She verbalized understanding. EUS not available this week through [facility]. Will reach out to other advanced endoscopists and assess availability to complete procedure. Further recommendations to follow.  Liliane Shi, DO [facility]

## 2022-09-10 NOTE — ED Notes (Signed)
Pt ambulated to the bathroom w/ standby assist only.  Her voice continues to improve, she feels like her voice is normal for her at this time.

## 2022-09-10 NOTE — Plan of Care (Signed)
  Problem: Education: Goal: Knowledge of General Education information will improve Description: Including pain rating scale, medication(s)/side effects and non-pharmacologic comfort measures Outcome: Progressing   Problem: Clinical Measurements: Goal: Will remain free from infection Outcome: Progressing   Problem: Nutrition: Goal: Adequate nutrition will be maintained Outcome: Progressing   Problem: Pain Managment: Goal: General experience of comfort will improve Outcome: Progressing   Problem: Safety: Goal: Ability to remain free from injury will improve Outcome: Progressing

## 2022-09-10 NOTE — Progress Notes (Signed)
Chaplain engaged in an initial visit with [name] as she was in the hallway tearfully in need of support. Chaplain worked to establish relationship and provide her a safe place to talk. [name] presented as being highly anxious after being told that something was wrong with her liver. [name] began to assume that she has liver cancer and began alluding to death. Chaplain worked to [employer] process and not focus on information that she has not been given or yet discovered. Nurse was able to later come in and explain to [name] what an MRI is and answer some questions that were weighing on her. [name] also expressed her desire to go back to her assisted living facility with all her friends. Chaplain recognized that [name] was feeling grief over her health and grief in possibly not being able to return to home. Chaplain again worked to help [name] in the moment and not think too far ahead with so little information. Chaplain normalized her feelings of shock and grief.   [name] is the mother of two children and is a woman of faith. She has a very close relationship with her daughter.   Chaplain provided reflective listening, support, and presence.   09/10/22 1500  Spiritual Encounters  Type of Visit Initial  Care provided to: Patient  Conversation partners present during encounter Nurse  Reason for visit Grief/loss  Spiritual Framework  Presenting Themes Impactful experiences and emotions  Community/Connection Family  Interventions  Spiritual Care Interventions Made Established relationship of care and support;Compassionate presence;Reflective listening;Supported grief process;Encouragement;Normalization of emotions;Bereavement/grief support

## 2022-09-10 NOTE — Plan of Care (Signed)
  Problem: Education: Goal: Knowledge of General Education information will improve Description: Including pain rating scale, medication(s)/side effects and non-pharmacologic comfort measures Outcome: Progressing   Problem: Health Behavior/Discharge Planning: Goal: Ability to manage health-related needs will improve Outcome: Progressing   Problem: Nutrition: Goal: Adequate nutrition will be maintained Outcome: Progressing   Problem: Coping: Goal: Level of anxiety will decrease Outcome: Progressing   Problem: Pain Managment: Goal: General experience of comfort will improve Outcome: Progressing   Problem: Safety: Goal: Ability to remain free from injury will improve Outcome: Progressing   Problem: Clinical Measurements: Goal: Diagnostic test results will improve Outcome: Not Progressing Note: See results

## 2022-09-10 NOTE — H&P (View-Only) (Signed)
[facility] Consult  Referring Provider: No ref. provider found Primary Care Physician:  Patient, No Pcp Per Primary Gastroenterologist: Unassigned  Reason for Consultation: Abnormal imaging, elevated liver chemistries  SUBJECTIVE:   HPI: [name] is a 77 y.o. female with past medical history significant for hypertension, hyperlipidemia, gastroesophageal reflux disease, personal history of peptic ulcer disease. Underwent EGD on 01/03/2015 through [facility] (Dr. Shelle Iron) with findings of likely healed ulcer bed in the gastric antrum and duodenum. She noted that she has been on omeprazole for LUQ abdominal pain in the recent past. She underwent colonoscopy on 08/18/2013 by Dr. Lina Sar with findings of cecum lipoma, sigmoid diverticulosis.   She presented to hospital on 09/09/22 with chief complaint of tongue swelling, has been treated for angioedema and doing well. She was found to have elevated liver enzymes in mixed pattern, AST/ALT 74/130, ALP 320, total bilirubin 1.2. Abdominal ultrasound completed 09/10/22 showed distended gallbladder without stones, dilated common bile duct (15 mm) and intrahepatic dilatation, hepatic steatosis.   She noted having some LUQ abdominal pain. No nausea or vomiting. Had issues with talking and speech with tongue swelling, no shortness of breath or cough. No chest pain. No change in bowel habits, no blood per rectum.   No known family history of GI malignancy or biliary disease.  No prior gastric bypass.   Past Medical History:  Diagnosis Date   ALCOHOL ABUSE, HX OF    ANEMIA-NOS    ANXIETY    CERVICAL RADICULOPATHY, LEFT    5 BACK OPERATIONS AND 2 NECK FUSIONS   DEGENERATIVE DISC DISEASE, CERVICAL SPINE    DEGENERATIVE DISC DISEASE, CERVICAL SPINE, W/RADICULOPATHY    Depressive disorder, not elsewhere classified    Depressive type psychosis    DIVERTICULITIS, HX OF    CLEARED ACCORDING TO PATIENT   DIZZINESS    GERD     HYPERLIPIDEMIA    HYPERTENSION    Insomnia, unspecified    LOW BACK PAIN    RUPTURED DISC   Memory loss    MIGRAINE HEADACHE 09/06/2017   NOT CURRENTLY   Palpitation    NOT A PROBLEM PER PATIENT   WEIGHT LOSS    Past Surgical History:  Procedure Laterality Date   BACK SURGERY  2008   X 5.Used her bone out of hips.   CERVICAL FUSION     X 2, Moses Lisman, [place], [place]   DILATION AND CURETTAGE OF UTERUS     ESOPHAGOGASTRODUODENOSCOPY N/A 01/03/2015   Procedure: ESOPHAGOGASTRODUODENOSCOPY (EGD);  Surgeon: Elnita Maxwell, MD;  Location: [facility] ENDOSCOPY;  Service: Endoscopy;  Laterality: N/A;   EYE SURGERY Bilateral    Cataract Extraction with IOL   HYSTEROSCOPY WITH D & C N/A 11/05/2016   Procedure: DILATATION AND CURETTAGE /HYSTEROSCOPY/MYOSURE RESECTION OF ENDOMETRIAL POLYP;  Surgeon: Schermerhorn, Ihor Austin, MD;  Location: [facility] ORS;  Service: Gynecology;  Laterality: N/A;   LUMBAR FUSION     TONSILLECTOMY     TOTAL HIP ARTHROPLASTY Right 08/04/2015   Procedure: TOTAL HIP ARTHROPLASTY ANTERIOR APPROACH;  Surgeon: Kennedy Bucker, MD;  Location: [facility] ORS;  Service: Orthopedics;  Laterality: Right;   TOTAL HIP ARTHROPLASTY Left 09/10/2017   Procedure: TOTAL HIP ARTHROPLASTY ANTERIOR APPROACH;  Surgeon: Kennedy Bucker, MD;  Location: [facility] ORS;  Service: Orthopedics;  Laterality: Left;   Prior to Admission medications   Medication Sig Start Date End Date Taking? Authorizing Provider  acetaminophen (TYLENOL) 500 MG tablet Take 500 mg by mouth every 8 (eight) hours  as needed for moderate pain.   Yes [provider]  bismuth subsalicylate (PEPTO BISMOL) 262 MG/15ML suspension Take 30 mLs by mouth every 6 (six) hours as needed for indigestion or diarrhea or loose stools.   Yes [provider]  buPROPion (WELLBUTRIN XL) 300 MG 24 hr tablet Take 300 mg by mouth daily.   Yes [provider]  busPIRone (BUSPAR) 5 MG tablet Take 5 mg by mouth 2 (two) times daily.   Yes  [provider]  dicyclomine (BENTYL) 20 MG tablet Take 20 mg by mouth 3 (three) times daily before meals.   Yes [provider]  donepezil (ARICEPT) 10 MG tablet Take 10 mg by mouth at bedtime.    Yes [provider]  Eszopiclone (ESZOPICLONE) 3 MG TABS Take 3 mg by mouth at bedtime. Take immediately before bedtime   Yes [provider]  guaifenesin (ROBITUSSIN) 100 MG/5ML syrup Take 10 mLs by mouth every 6 (six) hours as needed for cough. Not to exceed 4 doses in 24 hours   Yes [provider]  lisinopril (PRINIVIL,ZESTRIL) 20 MG tablet Take 20 mg by mouth daily.   Yes [provider]  loratadine (CLARITIN) 10 MG tablet Take 10 mg by mouth daily.   Yes [provider]  omeprazole (PRILOSEC) 20 MG capsule Take 20 mg by mouth 2 (two) times daily before a meal.   Yes [provider]  ondansetron (ZOFRAN-ODT) 8 MG disintegrating tablet Take 8 mg by mouth every 6 (six) hours as needed for nausea or vomiting.   Yes [provider]  pregabalin (LYRICA) 100 MG capsule Take 100 mg by mouth 3 (three) times daily.   Yes [provider]  senna (SENOKOT) 8.6 MG TABS tablet Take 1 tablet by mouth daily.   Yes [provider]  sertraline (ZOLOFT) 50 MG tablet Take 50 mg by mouth every evening.   Yes [provider]  traZODone (DESYREL) 50 MG tablet Take 50 mg by mouth at bedtime.   Yes [provider]   Current Facility-Administered Medications  Medication Dose Route Frequency Provider Last Rate Last Admin   acetaminophen (TYLENOL) tablet 650 mg  650 mg Oral Q6H PRN Dahal, Binaya, MD   650 mg at 09/10/22 0836   albuterol (PROVENTIL) (2.5 MG/3ML) 0.083% nebulizer solution 2.5 mg  2.5 mg Nebulization Q4H PRN Crosley, Debby, MD       ALPRAZolam Prudy Feeler) tablet 0.25 mg  0.25 mg Oral BID PRN Dahal, Melina Schools, MD   0.25 mg at 09/10/22 1308   amLODipine (NORVASC) tablet 10 mg  10 mg Oral Daily Crosley,  Debby, MD   10 mg at 09/10/22 0513   bismuth subsalicylate (PEPTO BISMOL) 262 MG/15ML suspension 30 mL  30 mL Oral Q6H PRN Crosley, Debby, MD       buPROPion (WELLBUTRIN XL) 24 hr tablet 300 mg  300 mg Oral Daily Crosley, Debby, MD   300 mg at 09/10/22 0839   busPIRone (BUSPAR) tablet 5 mg  5 mg Oral BID Crosley, Debby, MD   5 mg at 09/10/22 0838   dicyclomine (BENTYL) tablet 20 mg  20 mg Oral TID AC Crosley, Debby, MD   20 mg at 09/10/22 1307   diphenhydrAMINE (BENADRYL) capsule 25 mg  25 mg Oral Q8H Crosley, Debby, MD   25 mg at 09/10/22 1308   donepezil (ARICEPT) tablet 10 mg  10 mg Oral QHS Crosley, Debby, MD       enoxaparin (LOVENOX) injection 40  mg  40 mg Subcutaneous Q24H Crosley, Debby, MD   40 mg at 09/10/22 0838   famotidine (PEPCID) IVPB 20 mg premix  20 mg Intravenous Q12H Crosley, Debby, MD 100 mL/hr at 09/10/22 0516 20 mg at 09/10/22 0516   loratadine (CLARITIN) tablet 10 mg  10 mg Oral Daily Crosley, Debby, MD   10 mg at 09/10/22 0837   metoprolol tartrate (LOPRESSOR) injection 10 mg  10 mg Intravenous Q4H PRN Crosley, Debby, MD   10 mg at 09/10/22 0130   nicotine (NICODERM CQ - dosed in mg/24 hours) patch 14 mg  14 mg Transdermal Daily PRN Crosley, Debby, MD       ondansetron (ZOFRAN) tablet 4 mg  4 mg Oral Q6H PRN Crosley, Debby, MD       Or   ondansetron (ZOFRAN) injection 4 mg  4 mg Intravenous Q6H PRN Joneen Roach, Debby, MD   4 mg at 09/10/22 1914   predniSONE (DELTASONE) tablet 40 mg  40 mg Oral Q breakfast Crosley, Debby, MD   40 mg at 09/10/22 0836   pregabalin (LYRICA) capsule 100 mg  100 mg Oral TID Gery Pray, MD   100 mg at 09/10/22 7829   senna-docusate (Senokot-S) tablet 1 tablet  1 tablet Oral QHS PRN Crosley, Debby, MD       sertraline (ZOLOFT) tablet 50 mg  50 mg Oral QPM Crosley, Debby, MD       traZODone (DESYREL) tablet 50 mg  50 mg Oral QHS Gery Pray, MD       Allergies as of 09/09/2022 - Review Complete 09/09/2022  Allergen Reaction Noted   Codeine  Itching 09/19/2006   Atorvastatin Other (See Comments) 09/19/2006   Aspirin Other (See Comments) 11/29/2015   Morphine Other (See Comments) 09/19/2006   Nsaids  09/06/2017   Family History  Problem Relation Age of Onset   Alcohol abuse Father    Depression Other    Alcohol abuse Other    Social History   Socioeconomic History   Marital status: Widowed    Spouse name: Not on file   Number of children: Not on file   Years of education: Not on file   Highest education level: Not on file  Occupational History   Not on file  Tobacco Use   Smoking status: Every Day    Current packs/day: 0.50    Types: Cigarettes   Smokeless tobacco: Never  Vaping Use   Vaping status: Former  Substance and Sexual Activity   Alcohol use: No   Drug use: No   Sexual activity: Never  Other Topics Concern   Not on file  Social History Narrative   Not on file   Social Determinants of Health   Financial Resource Strain: Not on file  Food Insecurity: No Food Insecurity (09/10/2022)   Hunger Vital Sign    Worried About Running Out of Food in the Last Year: Never true    Ran Out of Food in the Last Year: Never true  Transportation Needs: No Transportation Needs (09/10/2022)   PRAPARE - Administrator, Civil Service (Medical): No    Lack of Transportation (Non-Medical): No  Physical Activity: Not on file  Stress: Not on file  Social Connections: Not on file  Intimate Partner Violence: Not At Risk (09/10/2022)   Humiliation, Afraid, Rape, and Kick questionnaire    Fear of Current or Ex-Partner: No    Emotionally Abused: No    Physically Abused: No    Sexually  Abused: No   Review of Systems:  Review of Systems  Respiratory:  Negative for shortness of breath.   Cardiovascular:  Negative for chest pain.  Gastrointestinal:  Positive for abdominal pain. Negative for blood in stool, constipation, diarrhea, nausea and vomiting.    OBJECTIVE:   Temp:  [97.5 F (36.4 C)-98.7 F (37.1  C)] 98 F (36.7 C) (07/15 1352) Pulse Rate:  [64-86] 73 (07/15 0745) Resp:  [14-27] 18 (07/15 1352) BP: (131-214)/(64-144) 151/119 (07/15 1352) SpO2:  [94 %-100 %] 94 % (07/15 1352) Weight:  [97.1 kg] 97.1 kg (07/15 0038) Last BM Date : 09/09/22 Physical Exam Constitutional:      General: She is not in acute distress.    Appearance: She is not ill-appearing, toxic-appearing or diaphoretic.     Comments: Somewhat tearful during conversation about abnormal imaging findings.  Cardiovascular:     Rate and Rhythm: Normal rate and regular rhythm.  Pulmonary:     Effort: No respiratory distress.     Breath sounds: Normal breath sounds.  Abdominal:     General: Bowel sounds are normal. There is no distension.     Palpations: Abdomen is soft.     Tenderness: There is no abdominal tenderness. There is no guarding.  Musculoskeletal:     Right lower leg: No edema.     Left lower leg: No edema.  Skin:    General: Skin is warm and dry.  Neurological:     Mental Status: She is alert.     Labs: Recent Labs    09/09/22 2219 09/10/22 0450  WBC 9.6 9.4  HGB 13.1 13.6  HCT 40.8 43.2  PLT 214 221   BMET Recent Labs    09/09/22 2219 09/10/22 0450  NA 139 138  K 3.9 4.1  CL 109 105  CO2 20* 24  GLUCOSE 96 192*  BUN 8 10  CREATININE 0.69 0.70  CALCIUM 9.3 9.5   LFT Recent Labs    09/10/22 0450  PROT 7.8  ALBUMIN 3.9  AST 74*  ALT 130*  ALKPHOS 320*  BILITOT 1.2   PT/INR No results for input(s): "LABPROT", "INR" in the last 72 hours.  Diagnostic imaging: US Abdomen Limited RUQ (LIVER/GB)  Result Date: 09/10/2022 CLINICAL DATA:  Elevated LFTs. EXAM: ULTRASOUND ABDOMEN LIMITED RIGHT UPPER QUADRANT COMPARISON:  CT abdomen 07/18/1999 FINDINGS: Gallbladder: Moderate distention of the gallbladder without wall thickening or gallstones. Reportedly, no sonographic Murphy sign. Common bile duct: Diameter: 1.5 cm Liver: Increased echogenicity. No focal lesion. At least mild  intrahepatic biliary dilatation. Liver is heterogeneous. Portal vein is patent on color Doppler imaging with normal direction of blood flow towards the liver. Other: None. IMPRESSION: 1. Distended gallbladder without gallstones. 2. Dilated common bile duct and intrahepatic biliary dilatation. Findings concerning for a biliary obstruction. Recommend further evaluation with MRCP. 3. Increased echogenicity of the liver is suggestive for hepatic steatosis. Electronically Signed   By: Richarda Overlie M.D.   On: 09/10/2022 12:10    IMPRESSION: Transaminase elevation in mixed pattern Dilated common bile duct Dilated intrahepatic bile duct History of peptic ulcer disease Mild LUQ pain   Other: HTN, HLD  PLAN: -Await MRCP results -Above findings will dictate further workup including any serologic testing -Trend liver enzyme panel  -Your IM care -[facility] will follow   LOS: 0 days   Liliane Shi, [facility]  Update [phone] -Received MRCP results showing pancreatic head lesion concerning for pancreatic cancer, discussed these findings with patient and  her adult children at bedside today. They asked appropriate questions. Informed them that next step would be upper endoscopy with endoscopic ultrasound for tissue biopsy and diagnosis. She verbalized understanding. EUS not available this week through [facility]. Will reach out to other advanced endoscopists and assess availability to complete procedure. Further recommendations to follow.  Liliane Shi, DO [facility]

## 2022-09-10 NOTE — ED Notes (Signed)
ED TO INPATIENT HANDOFF REPORT  ED Nurse Name and Phone #: Maryruth Hancock RN  S Name/Age/Gender [name] 77 y.o. female Room/Bed: RESB/RESB  Code Status   Code Status: Prior  Home/SNF/Other Skilled nursing facility Patient oriented to: [place], place, time, and situation Is this baseline? Yes   Triage Complete: Triage complete  Chief Complaint Angioedema [T78.3XXA]  Triage Note Pt. Arrives POV with daughter c/o an allergic rxn. Pt. Is experiencing tongue swelling and neck swelling. Pt. Is unsure what caused her reaction. Pt. Also take lisinopril daily for HTN.   Allergies Allergies  Allergen Reactions   Codeine Itching    Other Reaction(s): Not available   Atorvastatin Other (See Comments)    Unknown; not listed on MAR   Aspirin Other (See Comments)    Reaction: stomach ache  Other Reaction(s): Not available   Morphine Other (See Comments)    "severe headache"  Other Reaction(s): Not available   Nsaids     STOMACH PAINS    Level of Care/Admitting Diagnosis ED Disposition     ED Disposition  Admit   Condition  --   Comment  Hospital Area: [facility] [[id number]]  Level of Care: Telemetry [5]  Admit to tele based on following criteria: Other see comments  Comments: arryythmia  May place patient in observation at [facility] or [facility] if equivalent level of care is available:: Yes  Covid Evaluation: Asymptomatic - no recent exposure (last 10 days) testing not required  Diagnosis: Angioedema [119147]  Admitting Physician: Gery Pray [[id number]]  Attending Physician: Gery Pray [[id number]]          B Medical/Surgery History Past Medical History:  Diagnosis Date   ALCOHOL ABUSE, HX OF    ANEMIA-NOS    ANXIETY    CERVICAL RADICULOPATHY, LEFT    5 BACK OPERATIONS AND 2 NECK FUSIONS   DEGENERATIVE DISC DISEASE, CERVICAL SPINE    DEGENERATIVE DISC DISEASE, CERVICAL SPINE, W/RADICULOPATHY    Depressive disorder, not elsewhere  classified    Depressive type psychosis    DIVERTICULITIS, HX OF    CLEARED ACCORDING TO PATIENT   DIZZINESS    GERD    HYPERLIPIDEMIA    HYPERTENSION    Insomnia, unspecified    LOW BACK PAIN    RUPTURED DISC   Memory loss    MIGRAINE HEADACHE 09/06/2017   NOT CURRENTLY   Palpitation    NOT A PROBLEM PER PATIENT   WEIGHT LOSS    Past Surgical History:  Procedure Laterality Date   BACK SURGERY  2008   X 5.Used her bone out of hips.   CERVICAL FUSION     X 2, Moses Gholson, [place], [place]   DILATION AND CURETTAGE OF UTERUS     ESOPHAGOGASTRODUODENOSCOPY N/A 01/03/2015   Procedure: ESOPHAGOGASTRODUODENOSCOPY (EGD);  Surgeon: Elnita Maxwell, MD;  Location: [facility] ENDOSCOPY;  Service: Endoscopy;  Laterality: N/A;   EYE SURGERY Bilateral    Cataract Extraction with IOL   HYSTEROSCOPY WITH D & C N/A 11/05/2016   Procedure: DILATATION AND CURETTAGE /HYSTEROSCOPY/MYOSURE RESECTION OF ENDOMETRIAL POLYP;  Surgeon: Schermerhorn, Ihor Austin, MD;  Location: [facility] ORS;  Service: Gynecology;  Laterality: N/A;   LUMBAR FUSION     TONSILLECTOMY     TOTAL HIP ARTHROPLASTY Right 08/04/2015   Procedure: TOTAL HIP ARTHROPLASTY ANTERIOR APPROACH;  Surgeon: Kennedy Bucker, MD;  Location: [facility] ORS;  Service: Orthopedics;  Laterality: Right;   TOTAL HIP ARTHROPLASTY Left 09/10/2017   Procedure: TOTAL HIP  ARTHROPLASTY ANTERIOR APPROACH;  Surgeon: Kennedy Bucker, MD;  Location: [facility] ORS;  Service: Orthopedics;  Laterality: Left;     A IV Location/Drains/Wounds Patient Lines/Drains/Airways Status     Active Line/Drains/Airways     Name Placement date Placement time Site Days   Peripheral IV 09/09/22 20 G Posterior;Right Hand 09/09/22  2209  Hand  1   Negative Pressure Wound Therapy Hip Left 09/10/17  --  --  1826   Incision (Closed) 09/10/17 Hip Left 09/10/17  1016  -- 1826            Intake/Output Last 24 hours No intake or output data in the 24 hours ending 09/10/22 0003  Labs/Imaging Results  for orders placed or performed during the hospital encounter of 09/09/22 (from the past 48 hour(s))  CBC with Differential     Status: Abnormal   Collection Time: 09/09/22 10:19 PM  Result Value Ref Range   WBC 9.6 4.0 - 10.5 K/uL   RBC 4.18 3.87 - 5.11 MIL/uL   Hemoglobin 13.1 12.0 - 15.0 g/dL   HCT 27.2 53.6 - 64.4 %   MCV 97.6 80.0 - 100.0 fL   MCH 31.3 26.0 - 34.0 pg   MCHC 32.1 30.0 - 36.0 g/dL   RDW 03.4 74.2 - 59.5 %   Platelets 214 150 - 400 K/uL   nRBC 0.0 0.0 - 0.2 %   Neutrophils Relative % 53 %   Neutro Abs 5.2 1.7 - 7.7 K/uL   Lymphocytes Relative 38 %   Lymphs Abs 3.6 0.7 - 4.0 K/uL   Monocytes Relative 5 %   Monocytes Absolute 0.5 0.1 - 1.0 K/uL   Eosinophils Relative 2 %   Eosinophils Absolute 0.2 0.0 - 0.5 K/uL   Basophils Relative 1 %   Basophils Absolute 0.1 0.0 - 0.1 K/uL   Immature Granulocytes 1 %   Abs Immature Granulocytes 0.10 (H) 0.00 - 0.07 K/uL    Comment: Performed at [facility], [address], [place], [place] [address]  Comprehensive metabolic panel     Status: Abnormal   Collection Time: 09/09/22 10:19 PM  Result Value Ref Range   Sodium 139 135 - 145 mmol/L   Potassium 3.9 3.5 - 5.1 mmol/L   Chloride 109 98 - 111 mmol/L   CO2 20 (L) 22 - 32 mmol/L   Glucose, Bld 96 70 - 99 mg/dL    Comment: Glucose reference range applies only to samples taken after fasting for at least 8 hours.   BUN 8 8 - 23 mg/dL   Creatinine, Ser 6.43 0.44 - 1.00 mg/dL   Calcium 9.3 8.9 - 32.9 mg/dL   Total Protein 7.7 6.5 - 8.1 g/dL   Albumin 4.0 3.5 - 5.0 g/dL   AST 72 (H) 15 - 41 U/L   ALT 132 (H) 0 - 44 U/L   Alkaline Phosphatase 305 (H) 38 - 126 U/L   Total Bilirubin 1.4 (H) 0.3 - 1.2 mg/dL   GFR, Estimated >51 >88 mL/min    Comment: (NOTE) Calculated using the CKD-EPI Creatinine Equation (2021)    Anion gap 10 5 - 15    Comment: Performed at [facility], [address], [place], [place] [address]   No results  found.  Pending Labs Unresulted Labs (From admission, onward)    None       Vitals/Pain Today's Vitals   09/09/22 2215 09/09/22 2230 09/09/22 2300 09/10/22 0000  BP: (!) 196/95  (!) 142/131 [name])  186/89  Pulse: 73 67 64 72  Resp: 18 20 18 14   Temp:      SpO2: 98% 99% 99% 95%  PainSc:        Isolation Precautions No active isolations  Medications Medications  diphenhydrAMINE (BENADRYL) injection 25 mg (25 mg Intravenous Given 09/09/22 2212)  methylPREDNISolone sodium succinate (SOLU-MEDROL) 125 mg/2 mL injection 125 mg (125 mg Intravenous Given 09/09/22 2210)  famotidine (PEPCID) IVPB 20 mg premix (0 mg Intravenous Stopped 09/09/22 2303)    Mobility Pt states she has a walker but does not use it.      Focused Assessments Respiratory - no signs of any respiratory distress   R Recommendations: See Admitting Provider Note  Report given to:   Additional Notes: Pt is alert and oriented, walks w/ stand by assist

## 2022-09-10 NOTE — Progress Notes (Signed)
PROGRESS NOTE  Danett Palazzo Sen  DOB: Nov 29, 1945  PCP: Patient, No Pcp Per ONG:[id number]  DOA: 09/09/2022  LOS: 0 days  Hospital Day: 2  Brief narrative: Dillyn Joaquin Punt is a 77 y.o. female with PMH significant for HTN, HLD, history of alcohol use, smoking, anxiety/depression, GERD who lives at [facility].    7/14, patient was brought to the ED after an allergic reaction.  Her daughter noted swelling of the tongue, neck affecting her ability to talk and swallow and hence brought to the ED.  No report of shortness of breath, drooling.  She has been taking lisinopril for over 5 years.  Denies any new or recent change in medication or over-the-counter medicines  In the ED, her blood pressure was 214/117 patient was given IV Solu-Medrol, IV Pepcid, IV Benadryl Swelling improved. Initial labs unremarkable except for elevated AST, ALT alk phos Kept on overnight observation.  Subjective: Patient was seen and examined this morning.  Pleasant elderly Caucasian female.  Sitting up at the edge of the bed.  Feels better.  Tongue swelling significantly improved per patient.  Updated daughter on the phone. Chart reviewed LFTs remain elevated  Hospital course: Angioedema Unclear etiology.  Has been on lisinopril for the last 5 years.   Improved with Solu-Medrol, Benadryl, Pepcid Lisinopril held. Prednisone 10 mg daily for next 3 days.  As needed Benadryl.  Accelerated hypertension Blood pressure over 200 initially, probably related to acute distress of angioedema Gradually improving. PTA meds lisinopril 20 mg daily which is on hold because of angioedema Has been started on Norvasc 10 mg daily IV hydralazine as needed   CBD obstruction LFT elevation history of alcohol use LFTs were noted to be elevated.  Right upper quadrant ultrasound this morning showed dilated gallbladder, dilated CBD without stone. MRCP was recommended. MRCP ordered.  [facility] consulted Recent  Labs  Lab 09/09/22 2219 09/10/22 0450  AST 72* 74*  ALT 132* 130*  ALKPHOS 305* 320*  BILITOT 1.4* 1.2  PROT 7.7 7.8  ALBUMIN 4.0 3.9  PLT 214 221   Anxiety and depression Continue Wellbutrin XL and trazodone    Tobacco use Counseled to quit. Nicotine patch offered   Mobility: Encourage ambulation  Goals of care   Code Status: Full Code     DVT prophylaxis:  enoxaparin (LOVENOX) injection 40 mg Start: 09/10/22 1000   Antimicrobials: None Fluid: None Consultants: GI Family Communication: Updated daughter on the phone  Status: Observation Level of care:  Telemetry   Patient from: Independent living center Anticipated d/c to: Pending clinical course Needs to continue in-hospital care:  MRCP pending       Diet:  Diet Order             Diet NPO time specified  Diet effective now                   Scheduled Meds:  amLODipine  10 mg Oral Daily   buPROPion  300 mg Oral Daily   busPIRone  5 mg Oral BID   dicyclomine  20 mg Oral TID AC   diphenhydrAMINE  25 mg Oral Q8H   donepezil  10 mg Oral QHS   enoxaparin (LOVENOX) injection  40 mg Subcutaneous Q24H   loratadine  10 mg Oral Daily   predniSONE  40 mg Oral Q breakfast   pregabalin  100 mg Oral TID   sertraline  50 mg Oral QPM   traZODone  50 mg  Oral QHS    PRN meds: acetaminophen, albuterol, bismuth subsalicylate, metoprolol tartrate, nicotine, ondansetron **OR** ondansetron (ZOFRAN) IV, senna-docusate   Infusions:   famotidine (PEPCID) IV 20 mg (09/10/22 0516)    Antimicrobials: Anti-infectives (From admission, onward)    None       Nutritional status:  Body mass index is 34.55 kg/m.          Objective: Vitals:   09/10/22 0449 09/10/22 0745  BP: 131/64 (!) 153/85  Pulse: 67 73  Resp: 18 19  Temp: 97.9 F (36.6 C) (!) 97.5 F (36.4 C)  SpO2: 94% 94%   No intake or output data in the 24 hours ending 09/10/22 1239 Filed Weights   09/10/22 0038  Weight: 97.1 kg    Weight change:  Body mass index is 34.55 kg/m.   Physical Exam: General exam: Pleasant, not in distress Skin: No rashes, lesions or ulcers. HEENT: Atraumatic, normocephalic, no obvious bleeding Lungs: Clear to auscultation bilaterally CVS: Regular rate and rhythm no murmur GI/Abd soft, nontender, nondistended, bowels are present CNS: Alert, awake oriented x 3 Psychiatry: Mood appropriate Extremities: No pedal edema, no calf tenderness  Data Review: I have personally reviewed the laboratory data and studies available.  F/u labs ordered Unresulted Labs (From admission, onward)     Start     Ordered   09/17/22 0500  Creatinine, serum  (enoxaparin (LOVENOX)    CrCl >/= 30 ml/min)  Weekly,   R     Comments: while on enoxaparin therapy   Question:  Specimen collection method  Answer:  Lab=Lab collect   09/10/22 0132   09/11/22 0500  CBC with Differential/Platelet  Daily,   R     Question:  Specimen collection method  Answer:  Lab=Lab collect   09/10/22 1222   09/11/22 0500  Comprehensive metabolic panel  Daily,   R     Question:  Specimen collection method  Answer:  Lab=Lab collect   09/10/22 1222   09/10/22 0335  Hepatitis panel, acute  Once,   R       Question:  Specimen collection method  Answer:  Lab=Lab collect   09/10/22 0334            Total time spent in review of labs and imaging, patient evaluation, formulation of plan, documentation and communication with family: 55 minutes  Signed, Lorin Glass, MD [facility] 09/10/2022

## 2022-09-11 ENCOUNTER — Encounter (HOSPITAL_COMMUNITY)
Admission: EM | Disposition: A | Discharge status: Home / Self Care | Payer: Self-pay | Source: Home / Self Care | Attending: Internal Medicine

## 2022-09-11 ENCOUNTER — Observation Stay (HOSPITAL_COMMUNITY): Payer: Medicare Other | Admitting: Certified Registered Nurse Anesthetist

## 2022-09-11 ENCOUNTER — Inpatient Hospital Stay (HOSPITAL_COMMUNITY): Payer: Medicare Other

## 2022-09-11 ENCOUNTER — Encounter (HOSPITAL_COMMUNITY): Payer: Self-pay | Admitting: Family Medicine

## 2022-09-11 DIAGNOSIS — K2100 Gastro-esophageal reflux disease with esophagitis, without bleeding: Secondary | ICD-10-CM

## 2022-09-11 DIAGNOSIS — Z79899 Other long term (current) drug therapy: Secondary | ICD-10-CM | POA: Diagnosis not present

## 2022-09-11 DIAGNOSIS — Z981 Arthrodesis status: Secondary | ICD-10-CM | POA: Diagnosis not present

## 2022-09-11 DIAGNOSIS — F1011 Alcohol abuse, in remission: Secondary | ICD-10-CM | POA: Diagnosis present

## 2022-09-11 DIAGNOSIS — K8689 Other specified diseases of pancreas: Secondary | ICD-10-CM

## 2022-09-11 DIAGNOSIS — I10 Essential (primary) hypertension: Secondary | ICD-10-CM | POA: Diagnosis present

## 2022-09-11 DIAGNOSIS — Z9842 Cataract extraction status, left eye: Secondary | ICD-10-CM | POA: Diagnosis not present

## 2022-09-11 DIAGNOSIS — K219 Gastro-esophageal reflux disease without esophagitis: Secondary | ICD-10-CM | POA: Diagnosis present

## 2022-09-11 DIAGNOSIS — F17210 Nicotine dependence, cigarettes, uncomplicated: Secondary | ICD-10-CM

## 2022-09-11 DIAGNOSIS — K449 Diaphragmatic hernia without obstruction or gangrene: Secondary | ICD-10-CM

## 2022-09-11 DIAGNOSIS — T783XXA Angioneurotic edema, initial encounter: Secondary | ICD-10-CM | POA: Diagnosis present

## 2022-09-11 DIAGNOSIS — K869 Disease of pancreas, unspecified: Secondary | ICD-10-CM

## 2022-09-11 DIAGNOSIS — K838 Other specified diseases of biliary tract: Secondary | ICD-10-CM

## 2022-09-11 DIAGNOSIS — R590 Localized enlarged lymph nodes: Secondary | ICD-10-CM | POA: Diagnosis present

## 2022-09-11 DIAGNOSIS — K861 Other chronic pancreatitis: Secondary | ICD-10-CM | POA: Diagnosis present

## 2022-09-11 DIAGNOSIS — K828 Other specified diseases of gallbladder: Secondary | ICD-10-CM | POA: Diagnosis present

## 2022-09-11 DIAGNOSIS — K2980 Duodenitis without bleeding: Secondary | ICD-10-CM

## 2022-09-11 DIAGNOSIS — F419 Anxiety disorder, unspecified: Secondary | ICD-10-CM | POA: Diagnosis present

## 2022-09-11 DIAGNOSIS — C250 Malignant neoplasm of head of pancreas: Secondary | ICD-10-CM | POA: Diagnosis present

## 2022-09-11 DIAGNOSIS — Z96643 Presence of artificial hip joint, bilateral: Secondary | ICD-10-CM | POA: Diagnosis present

## 2022-09-11 DIAGNOSIS — E785 Hyperlipidemia, unspecified: Secondary | ICD-10-CM | POA: Diagnosis present

## 2022-09-11 DIAGNOSIS — K831 Obstruction of bile duct: Secondary | ICD-10-CM | POA: Diagnosis present

## 2022-09-11 DIAGNOSIS — K3189 Other diseases of stomach and duodenum: Secondary | ICD-10-CM

## 2022-09-11 DIAGNOSIS — Z961 Presence of intraocular lens: Secondary | ICD-10-CM | POA: Diagnosis present

## 2022-09-11 DIAGNOSIS — K2270 Barrett's esophagus without dysplasia: Secondary | ICD-10-CM | POA: Diagnosis present

## 2022-09-11 DIAGNOSIS — R748 Abnormal levels of other serum enzymes: Secondary | ICD-10-CM | POA: Diagnosis present

## 2022-09-11 DIAGNOSIS — Z9841 Cataract extraction status, right eye: Secondary | ICD-10-CM | POA: Diagnosis not present

## 2022-09-11 DIAGNOSIS — F32A Depression, unspecified: Secondary | ICD-10-CM | POA: Diagnosis present

## 2022-09-11 DIAGNOSIS — X58XXXA Exposure to other specified factors, initial encounter: Secondary | ICD-10-CM | POA: Diagnosis present

## 2022-09-11 HISTORY — PX: BIOPSY: SHX5522

## 2022-09-11 HISTORY — PX: ESOPHAGOGASTRODUODENOSCOPY: SHX5428

## 2022-09-11 HISTORY — PX: FINE NEEDLE ASPIRATION: SHX5430

## 2022-09-11 HISTORY — PX: UPPER ESOPHAGEAL ENDOSCOPIC ULTRASOUND (EUS): SHX6562

## 2022-09-11 LAB — COMPREHENSIVE METABOLIC PANEL
ALT: 102 U/L — ABNORMAL HIGH (ref 0–44)
AST: 53 U/L — ABNORMAL HIGH (ref 15–41)
Albumin: 3.8 g/dL (ref 3.5–5.0)
Alkaline Phosphatase: 251 U/L — ABNORMAL HIGH (ref 38–126)
Anion gap: 10 (ref 5–15)
BUN: 13 mg/dL (ref 8–23)
CO2: 22 mmol/L (ref 22–32)
Calcium: 9.3 mg/dL (ref 8.9–10.3)
Chloride: 103 mmol/L (ref 98–111)
Creatinine, Ser: 0.68 mg/dL (ref 0.44–1.00)
GFR, Estimated: >60 mL/min (ref >60)
Glucose, Bld: 137 mg/dL — ABNORMAL HIGH (ref 70–99)
Potassium: 3.7 mmol/L (ref 3.5–5.1)
Sodium: 135 mmol/L (ref 135–145)
Total Bilirubin: 1.2 mg/dL (ref 0.3–1.2)
Total Protein: 7.4 g/dL (ref 6.5–8.1)

## 2022-09-11 LAB — CBC WITH DIFFERENTIAL/PLATELET
Abs Immature Granulocytes: 0.06 10*3/uL (ref 0.00–0.07)
Basophils Absolute: 0 10*3/uL (ref 0.0–0.1)
Basophils Relative: 0 %
Eosinophils Absolute: 0 10*3/uL (ref 0.0–0.5)
Eosinophils Relative: 0 %
HCT: 40.2 % (ref 36.0–46.0)
Hemoglobin: 12.5 g/dL (ref 12.0–15.0)
Immature Granulocytes: 1 %
Lymphocytes Relative: 25 %
Lymphs Abs: 3 10*3/uL (ref 0.7–4.0)
MCH: 30.6 pg (ref 26.0–34.0)
MCHC: 31.1 g/dL (ref 30.0–36.0)
MCV: 98.3 fL (ref 80.0–100.0)
Monocytes Absolute: 0.6 10*3/uL (ref 0.1–1.0)
Monocytes Relative: 5 %
Neutro Abs: 8.1 10*3/uL — ABNORMAL HIGH (ref 1.7–7.7)
Neutrophils Relative %: 69 %
Platelets: 228 10*3/uL (ref 150–400)
RBC: 4.09 MIL/uL (ref 3.87–5.11)
RDW: 13.9 % (ref 11.5–15.5)
WBC: 11.8 10*3/uL — ABNORMAL HIGH (ref 4.0–10.5)
nRBC: 0 % (ref 0.0–0.2)

## 2022-09-11 SURGERY — EGD (ESOPHAGOGASTRODUODENOSCOPY)
Anesthesia: Monitor Anesthesia Care

## 2022-09-11 MED ORDER — LACTATED RINGERS IV SOLN
INTRAVENOUS | Status: DC
  Administered 2022-09-11: 1000 mL via INTRAVENOUS

## 2022-09-11 MED ORDER — LIDOCAINE 2% (20 MG/ML) 5 ML SYRINGE
INTRAMUSCULAR | Status: DC | PRN
  Administered 2022-09-11: 100 mg via INTRAVENOUS

## 2022-09-11 MED ORDER — CIPROFLOXACIN IN D5W 400 MG/200ML IV SOLN
INTRAVENOUS | Status: AC
  Filled 2022-09-11: qty 200

## 2022-09-11 MED ORDER — PROPOFOL 500 MG/50ML IV EMUL
INTRAVENOUS | Status: DC | PRN
  Administered 2022-09-11: 150 ug/kg/min via INTRAVENOUS

## 2022-09-11 MED ORDER — CIPROFLOXACIN IN D5W 400 MG/200ML IV SOLN
INTRAVENOUS | Status: DC | PRN
  Administered 2022-09-11: 400 mg via INTRAVENOUS

## 2022-09-11 MED ORDER — LACTATED RINGERS IV SOLN: INTRAVENOUS | Status: AC

## 2022-09-11 MED ORDER — PANTOPRAZOLE SODIUM 40 MG PO TBEC
40.0000 mg | DELAYED_RELEASE_TABLET | Freq: Every day | ORAL | Status: DC
  Administered 2022-09-11 – 2022-09-12 (×2): 40 mg via ORAL
  Filled 2022-09-11 (×2): qty 1

## 2022-09-11 MED ORDER — FENTANYL CITRATE (PF) 100 MCG/2ML IJ SOLN
INTRAMUSCULAR | Status: AC
  Filled 2022-09-11: qty 2

## 2022-09-11 MED ORDER — ACETAMINOPHEN 10 MG/ML IV SOLN
1000.0000 mg | Freq: Once | INTRAVENOUS | Status: AC
  Administered 2022-09-11: 1000 mg via INTRAVENOUS
  Filled 2022-09-11: qty 100

## 2022-09-11 MED ORDER — LACTATED RINGERS IV BOLUS: 500.0000 mL | Freq: Once | INTRAVENOUS | Status: DC

## 2022-09-11 MED ORDER — FENTANYL CITRATE (PF) 100 MCG/2ML IJ SOLN
25.0000 ug | Freq: Once | INTRAMUSCULAR | Status: AC
  Administered 2022-09-11: 25 ug via INTRAVENOUS

## 2022-09-11 MED ORDER — SODIUM CHLORIDE 0.9 % IV SOLN: INTRAVENOUS | Status: DC

## 2022-09-11 MED ORDER — PROPOFOL 10 MG/ML IV BOLUS
INTRAVENOUS | Status: DC | PRN
Start: 2022-09-11 — End: 2022-09-11
  Administered 2022-09-11 (×5): 20 mg via INTRAVENOUS

## 2022-09-11 NOTE — Progress Notes (Addendum)
Patient reviewed in recovery area patient's daughter at bedside. Patient's discomfort level 6 out of 10 in the mid epigastrium. She relays that this is her "ulcer pain" that she gets infrequently.  This is reiterated by the patient's daughter. Also learned that the patient previously had an extensive alcohol consumption history and had multiple hospitalizations for alcoholic pancreatitis. This has not been an issue for years as she has been in an assisted living and has been alcohol free for years. However the patient's daughter states that even yesterday she had an epigastric abdominal pain that was significant but the patient could not recall that herself. She is received a dose of IV fentanyl and she is received a dose of IV Tylenol as well. Her level of pain/discomfort is now 4 out of 10 after reassessment. Her chest x-ray and KUB have been ordered so we can rule out perforation. If there is no evidence of a significant abnormality, then she will be treated for post EUS pancreatitis/pain.  I have written her for a 500 cc LR bolus. Have also written her for a 125 mL/h of LR and to be monitored and for that to stop after 8 hours. She can continue to have pain control with the medicine service upstairs, pending x-rays do not show evidence of a overt complication. Patient will be able to get a GI cocktail upstairs as long as there is no evidence of perforation. The patient and patient's daughter agree with this plan of action. I have discussed this with the inpatient [facility] team and with inpatient medicine service.   Corliss Parish, MD [facility] Advanced Endoscopy Office # [phone]   [phone] Addendum Patient is clinically improved from pain perspective. Pending CXR/KUB read at which point, if all is well she can go back upstairs. She can be administered a GI Cocktail as needed if that is the case.  Corliss Parish, MD [facility] Advanced  Endoscopy Office # [phone]

## 2022-09-11 NOTE — Op Note (Signed)
[facility] Patient Name: [name] Procedure Date: 09/11/2022 MRN: [id number] Attending MD: Corliss Parish , MD, [id number] Date of Birth: 01/03/1946 CSN: [id number] Age: 77 Admit Type: Inpatient Procedure:                Upper EUS Indications:              Suspected mass in pancreas on MRCP, Abnormal                            ultrasound of the abdomen, Elevated alkaline                            phosphatase Providers:                Corliss Parish, MD, Eliberto Ivory, RN, Martha Clan, RN, Harrington Challenger, Technician Referring MD:             Enid Baas, Inpatient medical service Medicines:                Monitored Anesthesia Care Complications:            No immediate complications. Estimated Blood Loss:     Estimated blood loss was minimal. Procedure:                Pre-Anesthesia Assessment:                           - Prior to the procedure, a History and Physical                            was performed, and patient medications and                            allergies were reviewed. The patient's tolerance of                            previous anesthesia was also reviewed. The risks                            and benefits of the procedure and the sedation                            options and risks were discussed with the patient.                            All questions were answered, and informed consent                            was obtained. Prior Anticoagulants: The patient has                            taken Lovenox (enoxaparin), last dose was 1 day  prior to procedure. ASA Grade Assessment: III - A                            patient with severe systemic disease. After                            reviewing the risks and benefits, the patient was                            deemed in satisfactory condition to undergo the                            procedure.                           After  obtaining informed consent, the endoscope was                            passed under direct vision. Throughout the                            procedure, the patient's blood pressure, pulse, and                            oxygen saturations were monitored continuously. The                            [id number]-H190 ([id number]) Olympus endoscope was introduced                            through the mouth, and advanced to the second part                            of duodenum. The [id number]-[id number] ([id number]) Olympus                            duodenoscope was introduced through the mouth, and                            advanced to the area of papilla. The [id number]                            ([id number]) Olympus linear ultrasound scope was                            introduced through the mouth, and advanced to the                            duodenum for ultrasound examination from the                            stomach and duodenum. The upper EUS was  accomplished without difficulty. The patient                            tolerated the procedure. Scope In: Scope Out: Findings:      ENDOSCOPIC FINDING: :      No gross lesions were noted in the majority of the esophagus.      Scattered islands of salmon-colored mucosa were present from 34 to 35 cm       in the very distal esophagus. No other visible abnormalities were       present. Biopsies were taken with a cold forceps for histology.      The Z-line was irregular and was found 35 cm from the incisors.      A 3 cm hiatal hernia was present.      Patchy mildly erythematous mucosa was found in the entire examined       stomach. Biopsies were taken with a cold forceps for histology and       Helicobacter pylori testing.      Localized moderately erythematous mucosa without active bleeding and       with no stigmata of bleeding was found in the duodenal bulb apex.       Biopsies were taken with a cold forceps for histology.      The  major papilla was normal.      A single 16 mm subepithelial nodule was found in the second portion of       the duodenum adjacent to major papilla.      No other gross lesions were noted in the visualized duodenal bulb, in       the first portion of the duodenum and in the second portion of the       duodenum.      ENDOSONOGRAPHIC FINDING: :      Pancreatic parenchymal abnormalities were noted in the entire pancreas.       These consisted of lobularity with honeycombing.      An irregular lesion was identified in the pancreatic head (this is where       the CBD and PD began to dilate). The area was hypoechoic. The region       measured 29 mm by 21 mm in maximal cross-sectional diameter. The outer       margins were irregular. There was sonographic evidence suggesting       abutment of the superior mesenteric vein. An intact interface was seen       between the mass and the superior mesenteric artery, celiac trunk and       portal vein suggesting a lack of invasion. The remainder of the pancreas       was examined. The endosonographic appearance of parenchyma and the       upstream pancreatic duct indicated duct prominence of the head (6.4 mm)       and neck (3.6 mm) and body (3.6 mm) and tail (3.0 mm -> 2.1 mm) with       parenchymal changes as noted above in regards to lobularity and       honeycombing. Fine needle biopsy was performed of the masslike region.       Color Doppler imaging was utilized prior to needle puncture to confirm a       lack of significant vascular structures within the needle path. Seven       passes were made with the 22 gauge  Acquire biopsy needle using a       transduodenal approach. A visible core of tissue was obtained.       Preliminary cytologic examination and touch preps were performed. Final       cytology results are pending.      There was dilation in the common bile duct (3.4 mm -> 11.5 mm), in the       common hepatic duct (12.1 mm) and noted  distention of the gallbladder       without overt evidence of cholelithiasis or choledocholithiasis.      Endosonographic imaging of the ampulla showed no intramural       (subepithelial) lesion.      Endosonographic imaging in the visualized portion of the liver showed no       mass.      One enlarged lymph node was visualized in the porta hepatis region. It       measured 7 mm by 5 mm in maximal cross-sectional diameter. The node was       triangular, isoechoic and had well defined margins.      The celiac region was visualized. Impression:               EGD impression:                           - No gross lesions in the majority of the esophagus.                           - Salmon-colored mucosal islands suspicious for                            short-segment Barrett's esophagus noted distally.                            Biopsied.                           - Z-line irregular, 35 cm from the incisors.                           - 3 cm hiatal hernia.                           - Erythematous mucosa in the stomach. Biopsied.                           - Erythematous duodenopathy in the bulb/apex.                            Biopsied.                           - Normal major papilla.                           - Subepithelial nodule found in the duodenum                            adjacent to the major papilla.                           -  No other gross lesions in the duodenal bulb, in                            the first portion of the duodenum and in the second                            portion of the duodenum.                           EUS impression:                           - Pancreatic parenchymal abnormalities consisting                            of lobularity with honeycombing were noted in the                            entire pancreas.                           - A masslike region was identified in the                            pancreatic head (where the CBD and PD began to                             dilate). Cytology results are pending. However, the                            endosonographic appearance is suspicious for                            adenocarcinoma. This was staged T2 N0 Mx by                            endosonographic criteria. The staging applies if                            malignancy is confirmed. Fine needle biopsy                            performed.                           - There was dilation in the common bile duct, in                            the common hepatic duct and noted gallbladder                            distention. No evidence of cholelithiasis or                            choledocholithiasis was noted.                           -  One enlarged lymph node was visualized in the                            porta hepatis region. Tissue has not been obtained.                            However, the endosonographic appearance is                            suggestive of benign inflammatory changes. Moderate Sedation:      Not Applicable - Patient had care per Anesthesia. Recommendation:           - The patient will be observed post-procedure,                            until all discharge criteria are met.                           - Return patient to hospital ward for ongoing care.                           - Advance diet as tolerated.                           - Monitor for signs/symptoms of bleeding,                            perforation, and infection. If issues please call                            our number to get further assistance as needed.                           - Observe patient's clinical course.                           - Await cytology results and await path results.                           - If cytology is not definitive for malignancy,                            then repeat EUS is reasonable and can be pursued by                            [facility] primary endosonographer when he returns                             unless his absence is going to be extended.                           - If malignancy is found, additional imaging  including a CT chest will be required.                           - CA 19?"9 looks to be pending already.                           - The findings and recommendations were discussed                            with the patient.                           - The findings and recommendations were discussed                            with the referring physician. Procedure Code(s):        --- Professional ---                           [phone], Esophagogastroduodenoscopy, flexible,                            transoral; with transendoscopic ultrasound-guided                            intramural or transmural fine needle                            aspiration/biopsy(s), (includes endoscopic                            ultrasound examination limited to the esophagus,                            stomach or duodenum, and adjacent structures)                           43239, 59, Esophagogastroduodenoscopy, flexible,                            transoral; with biopsy, single or multiple Diagnosis Code(s):        --- Professional ---                           K22.89, Other specified disease of esophagus                           K44.9, Diaphragmatic hernia without obstruction or                            gangrene                           K31.89, Other diseases of stomach and duodenum                           K86.9, Disease of pancreas, unspecified  K86.89, Other specified diseases of pancreas                           R59.0, Localized enlarged lymph nodes                           R74.8, Abnormal levels of other serum enzymes                           K83.8, Other specified diseases of biliary tract                           K82.8, Other specified diseases of gallbladder                           R93.3, Abnormal findings on  diagnostic imaging of                            other parts of digestive tract                           R93.5, Abnormal findings on diagnostic imaging of                            other abdominal regions, including retroperitoneum CPT copyright 2022 [employer]. All rights reserved. The codes documented in this report are preliminary and upon coder review may  be revised to meet current compliance requirements. Corliss Parish, MD 09/11/2022 1:32:24 PM Number of Addenda: 0

## 2022-09-11 NOTE — Transfer of Care (Signed)
Immediate Anesthesia Transfer of Care Note  Patient: Minnetta Sandora [place]  Procedure(s) Performed: ESOPHAGOGASTRODUODENOSCOPY (EGD) UPPER ESOPHAGEAL ENDOSCOPIC ULTRASOUND (EUS) BIOPSY FINE NEEDLE ASPIRATION (FNA) LINEAR  Patient Location: Endoscopy Unit  Anesthesia Type:MAC  Level of Consciousness: awake, alert , and oriented  Airway & Oxygen Therapy: Patient Spontanous Breathing and Patient connected to face mask oxygen  Post-op Assessment: Report given to RN and Post -op Vital signs reviewed and stable  Post vital signs: Reviewed and stable  Last Vitals:  Vitals Value Taken Time  BP 171/91   Temp    Pulse 83 09/11/22 1324  Resp 19 09/11/22 1324  SpO2 95 % 09/11/22 1324  Vitals shown include unfiled device data.  Last Pain:  Vitals:   09/11/22 1119  TempSrc: Temporal  PainSc: 0-No pain         Complications: No notable events documented.

## 2022-09-11 NOTE — Interval H&P Note (Signed)
History and Physical Interval Note:  09/11/2022 11:39 AM  [name]  has presented today for surgery, with the diagnosis of Pancreatic head mass.  The various methods of treatment have been discussed with the patient and family. After consideration of risks, benefits and other options for treatment, the patient has consented to  Procedure(s): ESOPHAGOGASTRODUODENOSCOPY (EGD) (N/A) UPPER ESOPHAGEAL ENDOSCOPIC ULTRASOUND (EUS) (N/A) as a surgical intervention.  The patient's history has been reviewed, patient examined, no change in status, stable for surgery.  I have reviewed the patient's chart and labs.  Questions were answered to the patient's satisfaction.    The risks of an EUS including intestinal perforation, bleeding, infection, aspiration, and medication effects were discussed as was the possibility it may not give a definitive diagnosis if a biopsy is performed.  When a biopsy of the pancreas is done as part of the EUS, there is an additional risk of pancreatitis at the rate of about 1-2%.  It was explained that procedure related pancreatitis is typically mild, although it can be severe and even life threatening, which is why we do not perform random pancreatic biopsies and only biopsy a lesion/area we feel is concerning enough to warrant the risk.    [employer]

## 2022-09-11 NOTE — Progress Notes (Signed)
PROGRESS NOTE  [name]  DOB: 06-14-1945  PCP: Patient, No Pcp Per ZOX:[id number]  DOA: 09/09/2022  LOS: 0 days  Hospital Day: 3  Brief narrative: [name] is a 77 y.o. female with PMH significant for HTN, HLD, history of alcohol use, smoking, anxiety/depression, GERD who lives at [facility].    7/14, patient was brought to the ED after an allergic reaction.  Her daughter noted swelling of the tongue, neck affecting her ability to talk and swallow and hence brought to the ED.  No report of shortness of breath, drooling.  She has been taking lisinopril for over 5 years.  Denies any new or recent change in medication or over-the-counter medicines  In the ED, her blood pressure was 214/117 patient was given IV Solu-Medrol, IV Pepcid, IV Benadryl Swelling improved. Initial labs unremarkable except for elevated AST, ALT alk phos Kept on overnight observation.  Subjective: Patient was seen and examined this morning.  Not in distress.[name]   Pending EUS today  Hospital course: Angioedema Unclear etiology.  Has been on lisinopril for the last 5 years.   Improved with Solu-Medrol, Benadryl, Pepcid Lisinopril held. Prednisone 10 mg daily for next 3 days.  As needed Benadryl.  Accelerated hypertension Blood pressure was over 200 initially, probably related to acute distress of angioedema Gradually improving. PTA meds lisinopril 20 mg daily which is on hold because of angioedema Has been started on Norvasc 10 mg daily IV hydralazine as needed   CBD obstruction LFT elevation history of alcohol use LFTs were noted to be elevated.  Right upper quadrant ultrasound this morning showed dilated gallbladder, dilated CBD without stone. MRCP was obtained which showed a 2.8 x 2.1 cm pancreatic head mass causing intra and extremity biliary dilatation, CBD size 1.2 cm.  No other evidence of lymphadenopathy or metastasis GI consulted.  EUS planned today. Recent  Labs  Lab 09/09/22 2219 09/10/22 0450 09/11/22 0505  AST 72* 74* 53*  ALT 132* 130* 102*  ALKPHOS 305* 320* 251*  BILITOT 1.4* 1.2 1.2  PROT 7.7 7.8 7.4  ALBUMIN 4.0 3.9 3.8  PLT 214 221 228   Anxiety and depression Continue Wellbutrin XL and trazodone    Tobacco use Counseled to quit. Nicotine patch offered   Mobility: Encourage ambulation  Goals of care   Code Status: Full Code     DVT prophylaxis:  enoxaparin (LOVENOX) injection 40 mg Start: 09/10/22 1000   Antimicrobials: None Fluid: None Consultants: GI Family Communication: Daughter at bedside  Status: Observation Level of care:  Telemetry   Patient from: Independent living center Anticipated d/c to: Pending clinical course Needs to continue in-hospital care:  EUS pending       Diet:  Diet Order             Diet NPO time specified Except for: [employer], Sips with Meds  Diet effective midnight                   Scheduled Meds:  [MAR Hold] amLODipine  10 mg Oral Daily   [MAR Hold] buPROPion  300 mg Oral Daily   [MAR Hold] busPIRone  5 mg Oral BID   [MAR Hold] dicyclomine  20 mg Oral TID AC   [MAR Hold] donepezil  10 mg Oral QHS   [MAR Hold] enoxaparin (LOVENOX) injection  40 mg Subcutaneous Q24H   [MAR Hold] loratadine  10 mg Oral Daily   [MAR Hold] predniSONE  40 mg  Oral Q breakfast   [MAR Hold] pregabalin  100 mg Oral TID   [MAR Hold] sertraline  50 mg Oral QPM   [MAR Hold] traZODone  50 mg Oral QHS    PRN meds: [MAR Hold] acetaminophen, [MAR Hold] albuterol, [MAR Hold] ALPRAZolam, [MAR Hold] bismuth subsalicylate, [MAR Hold] metoprolol tartrate, [MAR Hold] nicotine, [MAR Hold] ondansetron **OR** [MAR Hold] ondansetron (ZOFRAN) IV, [MAR Hold] senna-docusate   Infusions:   [MAR Hold] famotidine (PEPCID) IV 20 mg (09/11/22 1012)   lactated ringers 10 mL/hr at 09/11/22 1202    Antimicrobials: Anti-infectives (From admission, onward)    None       Nutritional status:  Body  mass index is 33.89 kg/m.          Objective: Vitals:   09/11/22 1010 09/11/22 1119  BP: (!) 154/77 (!) 170/77  Pulse:  65  Resp:  15  Temp:  97.8 F (36.6 C)  SpO2:  99%    Intake/Output Summary (Last 24 hours) at 09/11/2022 1325 Last data filed at 09/11/2022 1312 Gross per 24 hour  Intake 1400 ml  Output --  Net 1400 ml   Filed Weights   09/10/22 0038 09/11/22 1119  Weight: 97.1 kg 95.3 kg   Weight change:  Body mass index is 33.89 kg/m.   Physical Exam: General exam: Pleasant, not in distress Skin: No rashes, lesions or ulcers. HEENT: Atraumatic, normocephalic, no obvious bleeding Lungs: Clear to auscultation bilaterally CVS: Regular rate and rhythm no murmur GI/Abd soft, nontender, nondistended, bowels are present CNS: Alert, awake oriented x 3 Psychiatry: Mood appropriate Extremities: No pedal edema, no calf tenderness  Data Review: I have personally reviewed the laboratory data and studies available.  F/u labs ordered Unresulted Labs (From admission, onward)     Start     Ordered   09/17/22 0500  Creatinine, serum  (enoxaparin (LOVENOX)    CrCl >/= 30 ml/min)  Weekly,   R     Comments: while on enoxaparin therapy   Question:  Specimen collection method  Answer:  Lab=Lab collect   09/10/22 0132   09/11/22 1106  Cancer antigen 19-9  Add-on,   AD       Question:  Specimen collection method  Answer:  Lab=Lab collect   09/11/22 1105   09/11/22 0500  CBC with Differential/Platelet  Daily,   R     Question:  Specimen collection method  Answer:  Lab=Lab collect   09/10/22 1222   09/11/22 0500  Comprehensive metabolic panel  Daily,   R     Question:  Specimen collection method  Answer:  Lab=Lab collect   09/10/22 1222            Total time spent in review of labs and imaging, patient evaluation, formulation of plan, documentation and communication with family: 25 minutes  Signed, Lorin Glass, MD [facility] 09/11/2022

## 2022-09-11 NOTE — Progress Notes (Signed)
[facility] Progress Note  SUBJECTIVE:   Interval history: [name] was seen and evaluated today at bedside. No family at bedside this AM. Resting comfortably, noted that she feels "fine", no further tongue swelling. Had some LUQ pain this AM, no further. No chest pain or shortness of breath. No nausea or vomiting. Had regular BM yesterday and today.   Past Medical History:  Diagnosis Date   ALCOHOL ABUSE, HX OF    ANEMIA-NOS    ANXIETY    CERVICAL RADICULOPATHY, LEFT    5 BACK OPERATIONS AND 2 NECK FUSIONS   DEGENERATIVE DISC DISEASE, CERVICAL SPINE    DEGENERATIVE DISC DISEASE, CERVICAL SPINE, W/RADICULOPATHY    Depressive disorder, not elsewhere classified    Depressive type psychosis    DIVERTICULITIS, HX OF    CLEARED ACCORDING TO PATIENT   DIZZINESS    GERD    HYPERLIPIDEMIA    HYPERTENSION    Insomnia, unspecified    LOW BACK PAIN    RUPTURED DISC   Memory loss    MIGRAINE HEADACHE 09/06/2017   NOT CURRENTLY   Palpitation    NOT A PROBLEM PER PATIENT   WEIGHT LOSS    Past Surgical History:  Procedure Laterality Date   BACK SURGERY  2008   X 5.Used her bone out of hips.   CERVICAL FUSION     X 2, [facility], [place], [place]   DILATION AND CURETTAGE OF UTERUS     ESOPHAGOGASTRODUODENOSCOPY N/A 01/03/2015   Procedure: ESOPHAGOGASTRODUODENOSCOPY (EGD);  Surgeon: Elnita Maxwell, MD;  Location: [facility] ENDOSCOPY;  Service: Endoscopy;  Laterality: N/A;   EYE SURGERY Bilateral    Cataract Extraction with IOL   HYSTEROSCOPY WITH D & C N/A 11/05/2016   Procedure: DILATATION AND CURETTAGE /HYSTEROSCOPY/MYOSURE RESECTION OF ENDOMETRIAL POLYP;  Surgeon: Schermerhorn, Ihor Austin, MD;  Location: [facility] ORS;  Service: Gynecology;  Laterality: N/A;   LUMBAR FUSION     TONSILLECTOMY     TOTAL HIP ARTHROPLASTY Right 08/04/2015   Procedure: TOTAL HIP ARTHROPLASTY ANTERIOR APPROACH;  Surgeon: Kennedy Bucker, MD;  Location: [facility] ORS;  Service: Orthopedics;  Laterality: Right;    TOTAL HIP ARTHROPLASTY Left 09/10/2017   Procedure: TOTAL HIP ARTHROPLASTY ANTERIOR APPROACH;  Surgeon: Kennedy Bucker, MD;  Location: [facility] ORS;  Service: Orthopedics;  Laterality: Left;   Current Facility-Administered Medications  Medication Dose Route Frequency Provider Last Rate Last Admin   acetaminophen (TYLENOL) tablet 650 mg  650 mg Oral Q6H PRN Dahal, Binaya, MD   650 mg at 09/10/22 2050   albuterol (PROVENTIL) (2.5 MG/3ML) 0.083% nebulizer solution 2.5 mg  2.5 mg Nebulization Q4H PRN Crosley, Debby, MD       ALPRAZolam Prudy Feeler) tablet 0.25 mg  0.25 mg Oral BID PRN Dahal, Melina Schools, MD   0.25 mg at 09/10/22 1308   amLODipine (NORVASC) tablet 10 mg  10 mg Oral Daily Crosley, Debby, MD   10 mg at 09/10/22 0513   bismuth subsalicylate (PEPTO BISMOL) 262 MG/15ML suspension 30 mL  30 mL Oral Q6H PRN Crosley, Debby, MD       buPROPion (WELLBUTRIN XL) 24 hr tablet 300 mg  300 mg Oral Daily Crosley, Debby, MD   300 mg at 09/10/22 0839   busPIRone (BUSPAR) tablet 5 mg  5 mg Oral BID Crosley, Debby, MD   5 mg at 09/10/22 2053   dicyclomine (BENTYL) tablet 20 mg  20 mg Oral TID AC Crosley, Debby, MD   20 mg at 09/10/22 1736  donepezil (ARICEPT) tablet 10 mg  10 mg Oral QHS Crosley, Debby, MD   10 mg at 09/10/22 2051   enoxaparin (LOVENOX) injection 40 mg  40 mg Subcutaneous Q24H Crosley, Debby, MD   40 mg at 09/10/22 0838   famotidine (PEPCID) IVPB 20 mg premix  20 mg Intravenous Q12H Crosley, Debby, MD 100 mL/hr at 09/10/22 2053 20 mg at 09/10/22 2053   loratadine (CLARITIN) tablet 10 mg  10 mg Oral Daily Crosley, Debby, MD   10 mg at 09/10/22 0837   metoprolol tartrate (LOPRESSOR) injection 10 mg  10 mg Intravenous Q4H PRN Crosley, Debby, MD   10 mg at 09/10/22 0130   nicotine (NICODERM CQ - dosed in mg/24 hours) patch 14 mg  14 mg Transdermal Daily PRN Crosley, Debby, MD       ondansetron (ZOFRAN) tablet 4 mg  4 mg Oral Q6H PRN Crosley, Debby, MD       Or   ondansetron (ZOFRAN) injection 4 mg  4  mg Intravenous Q6H PRN Crosley, Debby, MD   4 mg at 09/10/22 0619   predniSONE (DELTASONE) tablet 40 mg  40 mg Oral Q breakfast Crosley, Debby, MD   40 mg at 09/10/22 0836   pregabalin (LYRICA) capsule 100 mg  100 mg Oral TID Gery Pray, MD   100 mg at 09/10/22 2051   senna-docusate (Senokot-S) tablet 1 tablet  1 tablet Oral QHS PRN Crosley, Debby, MD       sertraline (ZOLOFT) tablet 50 mg  50 mg Oral QPM Crosley, Debby, MD   50 mg at 09/10/22 1736   traZODone (DESYREL) tablet 50 mg  50 mg Oral QHS Crosley, Debby, MD   50 mg at 09/10/22 2052   Allergies as of 09/09/2022 - Review Complete 09/09/2022  Allergen Reaction Noted   Codeine Itching 09/19/2006   Atorvastatin Other (See Comments) 09/19/2006   Aspirin Other (See Comments) 11/29/2015   Morphine Other (See Comments) 09/19/2006   Nsaids  09/06/2017   Review of Systems:  Review of Systems  Respiratory:  Negative for shortness of breath.   Cardiovascular:  Negative for chest pain.  Gastrointestinal:  Positive for abdominal pain. Negative for blood in stool, nausea and vomiting.    OBJECTIVE:   Temp:  [97.6 F (36.4 C)-98 F (36.7 C)] 97.6 F (36.4 C) (07/15 1917) Pulse Rate:  [60-110] 60 (07/16 0552) Resp:  [17-20] 17 (07/16 0552) BP: (137-172)/(61-119) 137/61 (07/16 0552) SpO2:  [94 %-99 %] 94 % (07/16 0552) Last BM Date : 09/09/22 Physical Exam Constitutional:      General: She is not in acute distress.    Appearance: She is not ill-appearing, toxic-appearing or diaphoretic.  Cardiovascular:     Rate and Rhythm: Normal rate and regular rhythm.  Pulmonary:     Effort: No respiratory distress.     Breath sounds: Normal breath sounds.  Abdominal:     General: Bowel sounds are normal. There is no distension.     Palpations: Abdomen is soft.     Tenderness: There is no abdominal tenderness. There is no guarding.  Skin:    General: Skin is warm and dry.  Neurological:     Mental Status: She is alert.      Labs: Recent Labs    09/09/22 2219 09/10/22 0450 09/11/22 0505  WBC 9.6 9.4 11.8*  HGB 13.1 13.6 12.5  HCT 40.8 43.2 40.2  PLT 214 221 228   BMET Recent Labs    09/09/22 2219 09/10/22  0450 09/11/22 0505  NA 139 138 135  K 3.9 4.1 3.7  CL 109 105 103  CO2 20* 24 22  GLUCOSE 96 192* 137*  BUN 8 10 13   CREATININE 0.69 0.70 0.68  CALCIUM 9.3 9.5 9.3   LFT Recent Labs    09/11/22 0505  PROT 7.4  ALBUMIN 3.8  AST 53*  ALT 102*  ALKPHOS 251*  BILITOT 1.2   PT/INR No results for input(s): "LABPROT", "INR" in the last 72 hours. Diagnostic imaging: MR ABDOMEN MRCP W WO CONTAST  Result Date: 09/10/2022 CLINICAL DATA:  Right upper quadrant abdominal pain, nondiagnostic ultrasound, biliary ductal dilatation EXAM: MRI ABDOMEN WITHOUT AND WITH CONTRAST (INCLUDING MRCP) TECHNIQUE: Multiplanar multisequence MR imaging of the abdomen was performed both before and after the administration of intravenous contrast. Heavily T2-weighted images of the biliary and pancreatic ducts were obtained, and three-dimensional MRCP images were rendered by post processing. CONTRAST:  10mL GADAVIST GADOBUTROL 1 MMOL/ML IV SOLN COMPARISON:  Right upper quadrant ultrasound, 09/10/2022 CT abdomen pelvis, 07/18/2019 FINDINGS: Lower chest: No acute abnormality. Hepatobiliary: No solid liver abnormality is seen. Distended gallbladder. No gallstones. Intra and extrahepatic biliary ductal dilatation, the common bile duct measuring up to 1.2 cm in caliber. The duct tapers smoothly within the pancreatic head but is effaced above the level of the ampulla (series 3, image 15, series 5, image 27). Pancreas: Subtly hypoenhancing ill-defined lesion of the ventral pancreatic head measuring 2.8 x 2.1 cm (series 19, image 57). Mild dilatation of the pancreatic duct in the pancreatic neck measuring up to 0.5 cm (series 4, image 21). Spleen: Normal in size without significant abnormality. Adrenals/Urinary Tract: Adrenal glands  are unremarkable. Kidneys are normal, without renal calculi, solid lesion, or hydronephrosis. Stomach/Bowel: Stomach is within normal limits. No evidence of bowel wall thickening, distention, or inflammatory changes. Vascular/Lymphatic: No significant vascular findings are present. No enlarged abdominal lymph nodes. Other: No abdominal wall hernia or abnormality. No ascites. Musculoskeletal: No acute or significant osseous findings. IMPRESSION: 1. Subtly hypoenhancing ill-defined lesion of the ventral pancreatic head measuring 2.8 x 2.1 cm, highly suspicious for pancreatic adenocarcinoma. 2. Intra and extrahepatic biliary ductal dilatation, the common bile duct measuring up to 1.2 cm in caliber. The duct tapers smoothly within the pancreatic head but is effaced above the level of the ampulla. Mild dilatation of the pancreatic duct in the pancreatic neck measuring up to 0.5 cm. 3. No evidence of lymphadenopathy or metastatic disease in the abdomen. These results will be called to the ordering clinician or representative by the Radiologist Assistant, and communication documented in the PACS or [employer]. Electronically Signed   By: Jearld Lesch M.D.   On: 09/10/2022 15:39   MR 3D Recon At Scanner  Result Date: 09/10/2022 CLINICAL DATA:  Right upper quadrant abdominal pain, nondiagnostic ultrasound, biliary ductal dilatation EXAM: MRI ABDOMEN WITHOUT AND WITH CONTRAST (INCLUDING MRCP) TECHNIQUE: Multiplanar multisequence MR imaging of the abdomen was performed both before and after the administration of intravenous contrast. Heavily T2-weighted images of the biliary and pancreatic ducts were obtained, and three-dimensional MRCP images were rendered by post processing. CONTRAST:  10mL GADAVIST GADOBUTROL 1 MMOL/ML IV SOLN COMPARISON:  Right upper quadrant ultrasound, 09/10/2022 CT abdomen pelvis, 07/18/2019 FINDINGS: Lower chest: No acute abnormality. Hepatobiliary: No solid liver abnormality is seen.  Distended gallbladder. No gallstones. Intra and extrahepatic biliary ductal dilatation, the common bile duct measuring up to 1.2 cm in caliber. The duct tapers smoothly within the pancreatic head but is  effaced above the level of the ampulla (series 3, image 15, series 5, image 27). Pancreas: Subtly hypoenhancing ill-defined lesion of the ventral pancreatic head measuring 2.8 x 2.1 cm (series 19, image 57). Mild dilatation of the pancreatic duct in the pancreatic neck measuring up to 0.5 cm (series 4, image 21). Spleen: Normal in size without significant abnormality. Adrenals/Urinary Tract: Adrenal glands are unremarkable. Kidneys are normal, without renal calculi, solid lesion, or hydronephrosis. Stomach/Bowel: Stomach is within normal limits. No evidence of bowel wall thickening, distention, or inflammatory changes. Vascular/Lymphatic: No significant vascular findings are present. No enlarged abdominal lymph nodes. Other: No abdominal wall hernia or abnormality. No ascites. Musculoskeletal: No acute or significant osseous findings. IMPRESSION: 1. Subtly hypoenhancing ill-defined lesion of the ventral pancreatic head measuring 2.8 x 2.1 cm, highly suspicious for pancreatic adenocarcinoma. 2. Intra and extrahepatic biliary ductal dilatation, the common bile duct measuring up to 1.2 cm in caliber. The duct tapers smoothly within the pancreatic head but is effaced above the level of the ampulla. Mild dilatation of the pancreatic duct in the pancreatic neck measuring up to 0.5 cm. 3. No evidence of lymphadenopathy or metastatic disease in the abdomen. These results will be called to the ordering clinician or representative by the Radiologist Assistant, and communication documented in the PACS or [employer]. Electronically Signed   By: Jearld Lesch M.D.   On: 09/10/2022 15:39   US Abdomen Limited RUQ (LIVER/GB)  Result Date: 09/10/2022 CLINICAL DATA:  Elevated LFTs. EXAM: ULTRASOUND ABDOMEN LIMITED RIGHT  UPPER QUADRANT COMPARISON:  CT abdomen 07/18/1999 FINDINGS: Gallbladder: Moderate distention of the gallbladder without wall thickening or gallstones. Reportedly, no sonographic Murphy sign. Common bile duct: Diameter: 1.5 cm Liver: Increased echogenicity. No focal lesion. At least mild intrahepatic biliary dilatation. Liver is heterogeneous. Portal vein is patent on color Doppler imaging with normal direction of blood flow towards the liver. Other: None. IMPRESSION: 1. Distended gallbladder without gallstones. 2. Dilated common bile duct and intrahepatic biliary dilatation. Findings concerning for a biliary obstruction. Recommend further evaluation with MRCP. 3. Increased echogenicity of the liver is suggestive for hepatic steatosis. Electronically Signed   By: Richarda Overlie M.D.   On: 09/10/2022 12:10    IMPRESSION: Pancreatic head mass concerning for malignancy Transaminase elevation in mixed pattern Dilated common bile duct Dilated intrahepatic duct History peptic ulcer disease  Other: hypertension, hyperlipidemia  PLAN: -Recommend EGD with EUS to investigate further, no [facility] provider available for EUS this week, have reached out to [facility] (Dr. Meridee Score), determining appropriate timing for procedure -Discussed procedure in detail with patient including benefits, alternatives and risks of bleeding/infection/perforation/missed lesion/anesthesia, she had her daughter Jasmine December on telephone and both verbalized understanding and wished to pursue -Maintain NPO for possible procedure today -Hold subcutaneous Lovenox, requested nursing to hold this AM -Trend liver enzymes -Further recommendations to follow procedure    LOS: 0 days   Liliane Shi, [facility]

## 2022-09-11 NOTE — Anesthesia Preprocedure Evaluation (Addendum)
Anesthesia Evaluation  Patient identified by MRN, date of birth, ID band Patient awake    Reviewed: Allergy & Precautions, NPO status , Patient's Chart, lab work & pertinent test results  Airway Mallampati: II  TM Distance: >3 FB Neck ROM: Full    Dental no notable dental hx.    Pulmonary Current Smoker and Patient abstained from smoking.   Pulmonary exam normal        Cardiovascular hypertension, Pt. on medications  Rhythm:Regular Rate:Normal     Neuro/Psych  Headaches  Anxiety Depression       GI/Hepatic Neg liver ROS,GERD  Medicated,,Pancreatic head mass   Endo/Other  negative endocrine ROS    Renal/GU negative Renal ROS  negative genitourinary   Musculoskeletal  (+) Arthritis ,    Abdominal Normal abdominal exam  (+)   Peds  Hematology  (+) Blood dyscrasia, anemia   Anesthesia Other Findings   Reproductive/Obstetrics                             Anesthesia Physical Anesthesia Plan  ASA: 3  Anesthesia Plan: MAC   Post-op Pain Management:    Induction: Intravenous  PONV Risk Score and Plan: 1 and Propofol infusion and Treatment may vary due to age or medical condition  Airway Management Planned: Simple Face Mask and Nasal Cannula  Additional Equipment: None  Intra-op Plan:   Post-operative Plan:   Informed Consent: I have reviewed the patients History and Physical, chart, labs and discussed the procedure including the risks, benefits and alternatives for the proposed anesthesia with the patient or authorized representative who has indicated his/her understanding and acceptance.     Dental advisory given  Plan Discussed with: CRNA  Anesthesia Plan Comments:        Anesthesia Quick Evaluation

## 2022-09-12 ENCOUNTER — Encounter (HOSPITAL_COMMUNITY): Payer: Self-pay | Admitting: Gastroenterology

## 2022-09-12 ENCOUNTER — Encounter: Payer: Self-pay | Admitting: Gastroenterology

## 2022-09-12 ENCOUNTER — Other Ambulatory Visit (HOSPITAL_COMMUNITY): Payer: Self-pay

## 2022-09-12 DIAGNOSIS — T783XXA Angioneurotic edema, initial encounter: Secondary | ICD-10-CM | POA: Diagnosis not present

## 2022-09-12 LAB — COMPREHENSIVE METABOLIC PANEL
ALT: 93 U/L — ABNORMAL HIGH (ref 0–44)
AST: 67 U/L — ABNORMAL HIGH (ref 15–41)
Albumin: 3.6 g/dL (ref 3.5–5.0)
Alkaline Phosphatase: 199 U/L — ABNORMAL HIGH (ref 38–126)
Anion gap: 8 (ref 5–15)
BUN: 13 mg/dL (ref 8–23)
CO2: 25 mmol/L (ref 22–32)
Calcium: 8.9 mg/dL (ref 8.9–10.3)
Chloride: 104 mmol/L (ref 98–111)
Creatinine, Ser: 0.63 mg/dL (ref 0.44–1.00)
GFR, Estimated: >60 mL/min (ref >60)
Glucose, Bld: 104 mg/dL — ABNORMAL HIGH (ref 70–99)
Potassium: 3.8 mmol/L (ref 3.5–5.1)
Sodium: 137 mmol/L (ref 135–145)
Total Bilirubin: 1.2 mg/dL (ref 0.3–1.2)
Total Protein: 6.7 g/dL (ref 6.5–8.1)

## 2022-09-12 LAB — CBC WITH DIFFERENTIAL/PLATELET
Abs Immature Granulocytes: 0.05 10*3/uL (ref 0.00–0.07)
Basophils Absolute: 0.1 10*3/uL (ref 0.0–0.1)
Basophils Relative: 1 %
Eosinophils Absolute: 0 10*3/uL (ref 0.0–0.5)
Eosinophils Relative: 0 %
HCT: 40.9 % (ref 36.0–46.0)
Hemoglobin: 12.9 g/dL (ref 12.0–15.0)
Immature Granulocytes: 1 %
Lymphocytes Relative: 36 %
Lymphs Abs: 3.3 10*3/uL (ref 0.7–4.0)
MCH: 31.4 pg (ref 26.0–34.0)
MCHC: 31.5 g/dL (ref 30.0–36.0)
MCV: 99.5 fL (ref 80.0–100.0)
Monocytes Absolute: 0.5 10*3/uL (ref 0.1–1.0)
Monocytes Relative: 5 %
Neutro Abs: 5.3 10*3/uL (ref 1.7–7.7)
Neutrophils Relative %: 57 %
Platelets: 218 10*3/uL (ref 150–400)
RBC: 4.11 MIL/uL (ref 3.87–5.11)
RDW: 13.5 % (ref 11.5–15.5)
WBC: 9.2 10*3/uL (ref 4.0–10.5)
nRBC: 0 % (ref 0.0–0.2)

## 2022-09-12 LAB — SURGICAL PATHOLOGY

## 2022-09-12 MED ORDER — HYDROCHLOROTHIAZIDE 12.5 MG PO TABS
12.5000 mg | ORAL_TABLET | Freq: Every day | ORAL | Status: DC
  Administered 2022-09-12: 12.5 mg via ORAL
  Filled 2022-09-12: qty 1

## 2022-09-12 MED ORDER — AMLODIPINE BESYLATE 10 MG PO TABS
10.0000 mg | ORAL_TABLET | Freq: Every day | ORAL | 0 refills | Status: DC
  Filled 2022-09-12: qty 30, 30d supply, fill #0

## 2022-09-12 MED ORDER — HYDROCHLOROTHIAZIDE 12.5 MG PO TABS
12.5000 mg | ORAL_TABLET | Freq: Every day | ORAL | 0 refills | Status: DC
  Filled 2022-09-12: qty 30, 30d supply, fill #0

## 2022-09-12 NOTE — Progress Notes (Signed)
Patient provided with discharge education, patient verbalized understanding. IV removed.  

## 2022-09-12 NOTE — Progress Notes (Signed)
[facility] Progress Note  SUBJECTIVE:   Interval history: [name] was seen and evaluated today at bedside. Resting comfortably in bed at time of exam. Denied abdominal pain. No chest pain or shortness of breath. Tolerated grits last evening, is wiling to attempt GI soft diet today.   Underwent EGD and upper endoscopic ultrasound yesterday with Dr. Meridee Score, findings of possible short segment barrett's esophagus status post biopsies, 3 cm hiatal hernia, erythema in entire stomach status post biopsies, a single subepithelial nodule (16 mm) was found in the duodenal bulb, findings of possible chronic pancreatitis, irregular lesion in pancreatic head with abutment of the superior mesenteric vein status post fine needle biopsy of lesion. Pathology is pending today.  Past Medical History:  Diagnosis Date   ALCOHOL ABUSE, HX OF    ANEMIA-NOS    ANXIETY    CERVICAL RADICULOPATHY, LEFT    5 BACK OPERATIONS AND 2 NECK FUSIONS   DEGENERATIVE DISC DISEASE, CERVICAL SPINE    DEGENERATIVE DISC DISEASE, CERVICAL SPINE, W/RADICULOPATHY    Depressive disorder, not elsewhere classified    Depressive type psychosis    DIVERTICULITIS, HX OF    CLEARED ACCORDING TO PATIENT   DIZZINESS    GERD    HYPERLIPIDEMIA    HYPERTENSION    Insomnia, unspecified    LOW BACK PAIN    RUPTURED DISC   Memory loss    MIGRAINE HEADACHE 09/06/2017   NOT CURRENTLY   Palpitation    NOT A PROBLEM PER PATIENT   WEIGHT LOSS    Past Surgical History:  Procedure Laterality Date   BACK SURGERY  2008   X 5.Used her bone out of hips.   BIOPSY  09/11/2022   Procedure: BIOPSY;  Surgeon: Meridee Score Netty Starring., MD;  Location: [facility];  Service: Gastroenterology;;   CERVICAL FUSION     X 2, [place], [place], [place]   DILATION AND CURETTAGE OF UTERUS     ESOPHAGOGASTRODUODENOSCOPY N/A 01/03/2015   Procedure: ESOPHAGOGASTRODUODENOSCOPY (EGD);  Surgeon: Elnita Maxwell, MD;  Location: [facility]  ENDOSCOPY;  Service: Endoscopy;  Laterality: N/A;   ESOPHAGOGASTRODUODENOSCOPY N/A 09/11/2022   Procedure: ESOPHAGOGASTRODUODENOSCOPY (EGD);  Surgeon: Lemar Lofty., MD;  Location: [facility];  Service: Gastroenterology;  Laterality: N/A;   EYE SURGERY Bilateral    Cataract Extraction with IOL   FINE NEEDLE ASPIRATION N/A 09/11/2022   Procedure: FINE NEEDLE ASPIRATION (FNA) LINEAR;  Surgeon: Lemar Lofty., MD;  Location: [facility];  Service: Gastroenterology;  Laterality: N/A;   HYSTEROSCOPY WITH D & C N/A 11/05/2016   Procedure: DILATATION AND CURETTAGE /HYSTEROSCOPY/MYOSURE RESECTION OF ENDOMETRIAL POLYP;  Surgeon: Schermerhorn, Ihor Austin, MD;  Location: [facility];  Service: Gynecology;  Laterality: N/A;   LUMBAR FUSION     TONSILLECTOMY     TOTAL HIP ARTHROPLASTY Right 08/04/2015   Procedure: TOTAL HIP ARTHROPLASTY ANTERIOR APPROACH;  Surgeon: Kennedy Bucker, MD;  Location: [facility];  Service: Orthopedics;  Laterality: Right;   TOTAL HIP ARTHROPLASTY Left 09/10/2017   Procedure: TOTAL HIP ARTHROPLASTY ANTERIOR APPROACH;  Surgeon: Kennedy Bucker, MD;  Location: [facility];  Service: Orthopedics;  Laterality: Left;   UPPER ESOPHAGEAL ENDOSCOPIC ULTRASOUND (EUS) N/A 09/11/2022   Procedure: UPPER ESOPHAGEAL ENDOSCOPIC ULTRASOUND (EUS);  Surgeon: Lemar Lofty., MD;  Location: [facility];  Service: Gastroenterology;  Laterality: N/A;   Current Facility-Administered Medications  Medication Dose Route Frequency Provider Last Rate Last Admin   acetaminophen (TYLENOL) tablet 650 mg  650 mg Oral Q6H PRN Lorin Glass, MD  650 mg at 09/12/22 0540   albuterol (PROVENTIL) (2.5 MG/3ML) 0.083% nebulizer solution 2.5 mg  2.5 mg Nebulization Q4H PRN Gery Pray, MD       ALPRAZolam Prudy Feeler) tablet 0.25 mg  0.25 mg Oral BID PRN Dahal, Melina Schools, MD   0.25 mg at 09/10/22 1308   amLODipine (NORVASC) tablet 10 mg  10 mg Oral Daily Crosley, Debby, MD   10 mg at 09/12/22 1031   bismuth  subsalicylate (PEPTO BISMOL) 262 MG/15ML suspension 30 mL  30 mL Oral Q6H PRN Crosley, Debby, MD       buPROPion (WELLBUTRIN XL) 24 hr tablet 300 mg  300 mg Oral Daily Crosley, Debby, MD   300 mg at 09/12/22 1031   busPIRone (BUSPAR) tablet 5 mg  5 mg Oral BID Crosley, Debby, MD   5 mg at 09/12/22 1031   dicyclomine (BENTYL) tablet 20 mg  20 mg Oral TID AC Crosley, Debby, MD   20 mg at 09/12/22 0824   donepezil (ARICEPT) tablet 10 mg  10 mg Oral QHS Crosley, Debby, MD   10 mg at 09/11/22 2100   enoxaparin (LOVENOX) injection 40 mg  40 mg Subcutaneous Q24H Mansouraty, Netty Starring., MD   40 mg at 09/12/22 1031   famotidine (PEPCID) IVPB 20 mg premix  20 mg Intravenous Q12H Crosley, Debby, MD 100 mL/hr at 09/12/22 1039 20 mg at 09/12/22 1039   hydrochlorothiazide (HYDRODIURIL) tablet 12.5 mg  12.5 mg Oral Daily Dahal, Binaya, MD   12.5 mg at 09/12/22 1031   loratadine (CLARITIN) tablet 10 mg  10 mg Oral Daily Crosley, Debby, MD   10 mg at 09/12/22 1031   metoprolol tartrate (LOPRESSOR) injection 10 mg  10 mg Intravenous Q4H PRN Crosley, Debby, MD   10 mg at 09/10/22 0130   nicotine (NICODERM CQ - dosed in mg/24 hours) patch 14 mg  14 mg Transdermal Daily PRN Crosley, Debby, MD       ondansetron (ZOFRAN) tablet 4 mg  4 mg Oral Q6H PRN Crosley, Debby, MD       Or   ondansetron (ZOFRAN) injection 4 mg  4 mg Intravenous Q6H PRN Crosley, Debby, MD   4 mg at 09/10/22 0619   pantoprazole (PROTONIX) EC tablet 40 mg  40 mg Oral Daily Mansouraty, Netty Starring., MD   40 mg at 09/12/22 0824   predniSONE (DELTASONE) tablet 40 mg  40 mg Oral Q breakfast Crosley, Debby, MD   40 mg at 09/12/22 0824   pregabalin (LYRICA) capsule 100 mg  100 mg Oral TID Gery Pray, MD   100 mg at 09/12/22 1031   senna-docusate (Senokot-S) tablet 1 tablet  1 tablet Oral QHS PRN Crosley, Debby, MD       sertraline (ZOLOFT) tablet 50 mg  50 mg Oral QPM Crosley, Debby, MD   50 mg at 09/11/22 2104   traZODone (DESYREL) tablet 50 mg  50 mg  Oral QHS Crosley, Debby, MD   50 mg at 09/11/22 2100   Allergies as of 09/09/2022 - Review Complete 09/09/2022  Allergen Reaction Noted   Codeine Itching 09/19/2006   Atorvastatin Other (See Comments) 09/19/2006   Aspirin Other (See Comments) 11/29/2015   Morphine Other (See Comments) 09/19/2006   Nsaids  09/06/2017   Review of Systems:  Review of Systems  Respiratory:  Negative for shortness of breath.   Cardiovascular:  Negative for chest pain.  Gastrointestinal:  Negative for abdominal pain.    OBJECTIVE:   Temp:  [97.3  F (36.3 C)-97.8 F (36.6 C)] 97.8 F (36.6 C) (07/17 0546) Pulse Rate:  [63-78] 63 (07/17 0546) Resp:  [13-21] 17 (07/17 0546) BP: (142-208)/(65-139) 142/65 (07/17 0546) SpO2:  [93 %-99 %] 93 % (07/17 0546) Weight:  [95.3 kg] 95.3 kg (07/16 1119) Last BM Date : 09/09/22 Physical Exam Constitutional:      General: She is not in acute distress.    Appearance: She is not ill-appearing, toxic-appearing or diaphoretic.  Cardiovascular:     Rate and Rhythm: Normal rate and regular rhythm.  Pulmonary:     Effort: No respiratory distress.     Breath sounds: Normal breath sounds.  Abdominal:     General: Bowel sounds are normal. There is no distension.     Palpations: Abdomen is soft.     Tenderness: There is no abdominal tenderness. There is no guarding.  Neurological:     Mental Status: She is alert.     Labs: Recent Labs    09/10/22 0450 09/11/22 0505 09/12/22 0519  WBC 9.4 11.8* 9.2  HGB 13.6 12.5 12.9  HCT 43.2 40.2 40.9  PLT 221 228 218   BMET Recent Labs    09/10/22 0450 09/11/22 0505 09/12/22 0519  NA 138 135 137  K 4.1 3.7 3.8  CL 105 103 104  CO2 24 22 25   GLUCOSE 192* 137* 104*  BUN 10 13 13   CREATININE 0.70 0.68 0.63  CALCIUM 9.5 9.3 8.9   LFT Recent Labs    09/12/22 0519  PROT 6.7  ALBUMIN 3.6  AST 67*  ALT 93*  ALKPHOS 199*  BILITOT 1.2   PT/INR No results for input(s): "LABPROT", "INR" in the last 72  hours. Diagnostic imaging: DG CHEST PORT 1 VIEW  Result Date: 09/11/2022 CLINICAL DATA:  Abdominal pain EXAM: PORTABLE CHEST 1 VIEW COMPARISON:  05/24/2019 FINDINGS: Postoperative findings in the cervical spine. Atherosclerotic calcification of the aortic arch. Suspected subsegmental atelectasis or scarring along the lingula. The lungs appear otherwise clear. No blunting of the costophrenic angles. IMPRESSION: 1. Subsegmental atelectasis or scarring along the lingula. 2. Atherosclerosis. Electronically Signed   By: Gaylyn Rong M.D.   On: 09/11/2022 15:21   DG Abd Portable 2V  Result Date: 09/11/2022 CLINICAL DATA:  Postprocedural abdominal pain, status post endoscopy and endoscopic ultrasound with biopsy of a pancreatic head lesion EXAM: PORTABLE ABDOMEN - 2 VIEW COMPARISON:  MRI abdomen 09/10/2022 and abdominal radiograph 01/08/2009 FINDINGS: No free air identified. Scattered gas in nondilated small and large bowel. Bilateral hip prostheses. Posterolateral rod and pedicle screw fixation at L4-5. Loss of disc height and intervertebral spurring at L3-4. Bilateral tubal ligation clips. IMPRESSION: 1. No free air identified.  Bowel gas pattern unremarkable. 2. Degenerative disc disease at L3-4. 3. Bilateral hip prostheses. Electronically Signed   By: Gaylyn Rong M.D.   On: 09/11/2022 15:18   MR ABDOMEN MRCP W WO CONTAST  Result Date: 09/10/2022 CLINICAL DATA:  Right upper quadrant abdominal pain, nondiagnostic ultrasound, biliary ductal dilatation EXAM: MRI ABDOMEN WITHOUT AND WITH CONTRAST (INCLUDING MRCP) TECHNIQUE: Multiplanar multisequence MR imaging of the abdomen was performed both before and after the administration of intravenous contrast. Heavily T2-weighted images of the biliary and pancreatic ducts were obtained, and three-dimensional MRCP images were rendered by post processing. CONTRAST:  10mL GADAVIST GADOBUTROL 1 MMOL/ML IV SOLN COMPARISON:  Right upper quadrant ultrasound,  09/10/2022 CT abdomen pelvis, 07/18/2019 FINDINGS: Lower chest: No acute abnormality. Hepatobiliary: No solid liver abnormality is seen. Distended gallbladder. No gallstones.  Intra and extrahepatic biliary ductal dilatation, the common bile duct measuring up to 1.2 cm in caliber. The duct tapers smoothly within the pancreatic head but is effaced above the level of the ampulla (series 3, image 15, series 5, image 27). Pancreas: Subtly hypoenhancing ill-defined lesion of the ventral pancreatic head measuring 2.8 x 2.1 cm (series 19, image 57). Mild dilatation of the pancreatic duct in the pancreatic neck measuring up to 0.5 cm (series 4, image 21). Spleen: Normal in size without significant abnormality. Adrenals/Urinary Tract: Adrenal glands are unremarkable. Kidneys are normal, without renal calculi, solid lesion, or hydronephrosis. Stomach/Bowel: Stomach is within normal limits. No evidence of bowel wall thickening, distention, or inflammatory changes. Vascular/Lymphatic: No significant vascular findings are present. No enlarged abdominal lymph nodes. Other: No abdominal wall hernia or abnormality. No ascites. Musculoskeletal: No acute or significant osseous findings. IMPRESSION: 1. Subtly hypoenhancing ill-defined lesion of the ventral pancreatic head measuring 2.8 x 2.1 cm, highly suspicious for pancreatic adenocarcinoma. 2. Intra and extrahepatic biliary ductal dilatation, the common bile duct measuring up to 1.2 cm in caliber. The duct tapers smoothly within the pancreatic head but is effaced above the level of the ampulla. Mild dilatation of the pancreatic duct in the pancreatic neck measuring up to 0.5 cm. 3. No evidence of lymphadenopathy or metastatic disease in the abdomen. These results will be called to the ordering clinician or representative by the Radiologist Assistant, and communication documented in the PACS or [employer]. Electronically Signed   By: Jearld Lesch M.D.   On: 09/10/2022 15:39    MR 3D Recon At Scanner  Result Date: 09/10/2022 CLINICAL DATA:  Right upper quadrant abdominal pain, nondiagnostic ultrasound, biliary ductal dilatation EXAM: MRI ABDOMEN WITHOUT AND WITH CONTRAST (INCLUDING MRCP) TECHNIQUE: Multiplanar multisequence MR imaging of the abdomen was performed both before and after the administration of intravenous contrast. Heavily T2-weighted images of the biliary and pancreatic ducts were obtained, and three-dimensional MRCP images were rendered by post processing. CONTRAST:  10mL GADAVIST GADOBUTROL 1 MMOL/ML IV SOLN COMPARISON:  Right upper quadrant ultrasound, 09/10/2022 CT abdomen pelvis, 07/18/2019 FINDINGS: Lower chest: No acute abnormality. Hepatobiliary: No solid liver abnormality is seen. Distended gallbladder. No gallstones. Intra and extrahepatic biliary ductal dilatation, the common bile duct measuring up to 1.2 cm in caliber. The duct tapers smoothly within the pancreatic head but is effaced above the level of the ampulla (series 3, image 15, series 5, image 27). Pancreas: Subtly hypoenhancing ill-defined lesion of the ventral pancreatic head measuring 2.8 x 2.1 cm (series 19, image 57). Mild dilatation of the pancreatic duct in the pancreatic neck measuring up to 0.5 cm (series 4, image 21). Spleen: Normal in size without significant abnormality. Adrenals/Urinary Tract: Adrenal glands are unremarkable. Kidneys are normal, without renal calculi, solid lesion, or hydronephrosis. Stomach/Bowel: Stomach is within normal limits. No evidence of bowel wall thickening, distention, or inflammatory changes. Vascular/Lymphatic: No significant vascular findings are present. No enlarged abdominal lymph nodes. Other: No abdominal wall hernia or abnormality. No ascites. Musculoskeletal: No acute or significant osseous findings. IMPRESSION: 1. Subtly hypoenhancing ill-defined lesion of the ventral pancreatic head measuring 2.8 x 2.1 cm, highly suspicious for pancreatic  adenocarcinoma. 2. Intra and extrahepatic biliary ductal dilatation, the common bile duct measuring up to 1.2 cm in caliber. The duct tapers smoothly within the pancreatic head but is effaced above the level of the ampulla. Mild dilatation of the pancreatic duct in the pancreatic neck measuring up to 0.5 cm. 3. No evidence of  lymphadenopathy or metastatic disease in the abdomen. These results will be called to the ordering clinician or representative by the Radiologist Assistant, and communication documented in the PACS or [employer]. Electronically Signed   By: Jearld Lesch M.D.   On: 09/10/2022 15:39    IMPRESSION: Pancreatic head mass concerning for malignancy  -Status post EGD with EUS on 09/11/22 with fine needle biopsy of lesion  -Pathology pending Gastric inflammation status post biopsies 09/11/22 Possible short segment Barrett's esophagus status post biopsies 09/11/22 Transaminase elevation in mixed pattern Dilated common bile duct Dilated intrahepatic duct History peptic ulcer disease   Other: hypertension, hyperlipidemia  PLAN: -Patient appears to be resting comfortably after her procedure yesterday -Pathology from procedure is pending  -Advance diet today, if tolerates should be stable for discharge -Discussed with IM   LOS: 1 day   Liliane Shi, [facility]

## 2022-09-12 NOTE — Plan of Care (Signed)
  Problem: Clinical Measurements: Goal: Will remain free from infection Outcome: Progressing   Problem: Coping: Goal: Level of anxiety will decrease Outcome: Progressing   Problem: Pain Managment: Goal: General experience of comfort will improve Outcome: Progressing   Problem: Safety: Goal: Ability to remain free from injury will improve Outcome: Progressing   

## 2022-09-12 NOTE — Anesthesia Postprocedure Evaluation (Signed)
Anesthesia Post Note  Patient: Schelly Chuba Christian  Procedure(s) Performed: ESOPHAGOGASTRODUODENOSCOPY (EGD) UPPER ESOPHAGEAL ENDOSCOPIC ULTRASOUND (EUS) BIOPSY FINE NEEDLE ASPIRATION (FNA) LINEAR     Patient location during evaluation: PACU Anesthesia Type: MAC Level of consciousness: awake and alert Pain management: pain level controlled Vital Signs Assessment: post-procedure vital signs reviewed and stable Respiratory status: spontaneous breathing, nonlabored ventilation, respiratory function stable and patient connected to nasal cannula oxygen Cardiovascular status: stable and blood pressure returned to baseline Postop Assessment: no apparent nausea or vomiting Anesthetic complications: no   No notable events documented.  Last Vitals:  Vitals:   09/11/22 1941 09/12/22 0546  BP: (!) 164/87 (!) 142/65  Pulse: 71 63  Resp: 18 17  Temp: 36.6 C 36.6 C  SpO2: 97% 93%    Last Pain:  Vitals:   09/12/22 0826  TempSrc:   PainSc: 0-No pain                 Earl Lites P Brode Sculley

## 2022-09-12 NOTE — Discharge Summary (Signed)
Physician Discharge Summary  [name] LKG:[id number] DOB: [birth year] DOA: 09/09/2022  PCP: Patient, No Pcp Per  Admit date: 09/09/2022 Discharge date: 09/12/2022  Admitted From: Independent living center Discharge disposition: Back to the same  Recommendations at discharge:  You have been switched from lisinopril to amlodipine and HCTZ for blood pressure control Follow-up with GI as an outpatient for biopsy report Continue omeprazole as before.  Brief narrative: [name] is a 77 y.o. female with PMH significant for HTN, HLD, history of alcohol use, smoking, anxiety/depression, GERD who lives at [facility].    7/14, patient was brought to the ED after an allergic reaction.  Her daughter noted swelling of the tongue, neck affecting her ability to talk and swallow and hence brought to the ED.  No report of shortness of breath, drooling.  She has been taking lisinopril for over 5 years.  Denies any new or recent change in medication or over-the-counter medicines  In the ED, her blood pressure was 214/117 patient was given IV Solu-Medrol, IV Pepcid, IV Benadryl Swelling improved. Initial labs unremarkable except for elevated AST, ALT alk phos Kept on overnight observation.  Subjective: Patient was seen and examined this morning.  Not in distress..   Tolerated advanced diet.  Feels ready to go today.  Hospital course: Angioedema Primarily admitted for angioedema of unclear etiology unclear etiology.  Patient had been on lisinopril for the last 5 years.   Improved with steroid Benadryl, Pepcid Lisinopril has been stopped  Accelerated hypertension Blood pressure was over 200 initially, probably related to acute distress of angioedema PTA meds lisinopril 20 mg daily which is on hold because of angioedema Has been started on Norvasc 10 mg daily.  HCTZ 12.5 mg daily added as well today.  Continue both at discharge.   Pancreatic tumor CBD  obstruction On initial workup, patient was noted to have elevated LFTs. Right upper quadrant ultrasound was obtained which showed dilated gallbladder, dilated CBD without stone. MRCP was obtained which showed a 2.8 x 2.1 cm pancreatic head mass causing intra and extremity biliary dilatation, CBD size 1.2 cm.  No other evidence of lymphadenopathy or metastasis GI was consulted. 7/16, underwent EGD and upper endoscopic ultrasound with Dr. Meridee Score, findings of possible short segment barrett's esophagus status post biopsies, 3 cm hiatal hernia, erythema in entire stomach status post biopsies, a single subepithelial nodule (16 mm) was found in the duodenal bulb, findings of possible chronic pancreatitis, irregular lesion in pancreatic head with abutment of the superior mesenteric vein status post fine needle biopsy of lesion.  Pathology remains pending at the time of discharge Able to tolerate diet, okay to discharge today. Given finding of Barrett's esophagus and diffuse erythema in the stomach, patient will continue omeprazole as before.   Anxiety and depression Continue Wellbutrin XL and trazodone    Tobacco use Counseled to quit. Nicotine patch offered   Mobility: Encourage ambulation  Goals of care   Code Status: Full Code   Wounds: Negative Pressure Wound Therapy Hip Left (Active)  Placement Date: 09/10/17   Wound Type: Surgical  Location: Hip  Location Orientation: Left    Assessments 09/10/2017 11:05 AM 09/13/2017  9:33 AM  Site / Wound Assessment Dressing in place / Unable to assess Dressing in place / Unable to assess  Cycle Continuous Continuous  Target Pressure (mmHg) 125 125  Dressing Status Intact --  Drainage Amount None --     No associated orders.  Incision (Closed) 09/10/17 Hip Left (Active)  Date First Assessed/Time First Assessed: 09/10/17 1016   Location: Hip  Location Orientation: Left    Assessments 09/10/2017 11:05 AM 09/13/2017  9:33 AM  Dressing Type  Negative pressure wound therapy Negative pressure wound therapy  Dressing Clean;Dry;Intact Clean;Dry;Intact  Site / Wound Assessment Dressing in place / Unable to assess Dressing in place / Unable to assess  Drainage Amount -- None  Treatment Negative pressure wound therapy --     No associated orders.    Discharge Exam:   Vitals:   09/11/22 1405 09/11/22 1605 09/11/22 1941 09/12/22 0546  BP:  (!) 151/78 (!) 164/87 (!) 142/65  Pulse: 77 78 71 63  Resp: 13 16 18 17   Temp:  97.8 F (36.6 C) 97.8 F (36.6 C) 97.8 F (36.6 C)  TempSrc:  Oral Oral Oral  SpO2: 93% 97% 97% 93%  Weight:      Height:        Body mass index is 33.89 kg/m.  General exam: Pleasant, not in distress Skin: No rashes, lesions or ulcers. HEENT: Atraumatic, normocephalic, no obvious bleeding Lungs: Clear to auscultation bilaterally CVS: Regular rate and rhythm no murmur GI/Abd soft, improving right upper quadrant tenderness, nondistended, bowels are present CNS: Alert, awake oriented x 3 Psychiatry: Mood appropriate Extremities: No pedal edema, no calf tenderness  Follow ups:    Follow-up Information     [facility] Follow up.   Contact information: [address] [place] [address] [phone]        Mansouraty, Netty Starring., MD Follow up.   Specialties: Gastroenterology, Internal Medicine Contact information: [address] [place] [address] [phone]                 Discharge Instructions:   Discharge Instructions     Call MD for:  difficulty breathing, headache or visual disturbances   Complete by: As directed    Call MD for:  extreme fatigue   Complete by: As directed    Call MD for:  hives   Complete by: As directed    Call MD for:  persistant dizziness or light-headedness   Complete by: As directed    Call MD for:  persistant nausea and vomiting   Complete by: As directed    Call MD for:  severe  uncontrolled pain   Complete by: As directed    Call MD for:  temperature >100.4   Complete by: As directed    Diet general   Complete by: As directed    Discharge instructions   Complete by: As directed    Recommendations at discharge:   You have been switched from lisinopril to amlodipine and HCTZ for blood pressure control  Follow-up with GI as an outpatient for biopsy report  Continue omeprazole as before.  Stop smoking   General discharge instructions: Follow with Primary MD Patient, No Pcp Per in 7 days  Please request your PCP  to go over your hospital tests, procedures, radiology results at the follow up. Please get your medicines reviewed and adjusted.  Your PCP may decide to repeat certain labs or tests as needed. Do not drive, operate heavy machinery, perform activities at heights, swimming or participation in water activities or provide baby sitting services if your were admitted for syncope or siezures until you have seen by Primary MD or a Neurologist and advised to do so again. [place] Controlled Substance Reporting  System database was reviewed. Do not drive, operate heavy machinery, perform activities at heights, swim, participate in water activities or provide baby-sitting services while on medications for pain, sleep and mood until your outpatient physician has reevaluated you and advised to do so again.  You are strongly recommended to comply with the dose, frequency and duration of prescribed medications. Activity: As tolerated with Full fall precautions use walker/cane & assistance as needed Avoid using any recreational substances like cigarette, tobacco, alcohol, or non-prescribed drug. If you experience worsening of your admission symptoms, develop shortness of breath, life threatening emergency, suicidal or homicidal thoughts you must seek medical attention immediately by calling 911 or calling your MD immediately  if symptoms less severe. You must read  complete instructions/literature along with all the possible adverse reactions/side effects for all the medicines you take and that have been prescribed to you. Take any new medicine only after you have completely understood and accepted all the possible adverse reactions/side effects.  Wear Seat belts while driving. You were cared for by a hospitalist during your hospital stay. If you have any questions about your discharge medications or the care you received while you were in the hospital after you are discharged, you can call the unit and ask to speak with the hospitalist or the covering physician. Once you are discharged, your primary care physician will handle any further medical issues. Please note that NO REFILLS for any discharge medications will be authorized once you are discharged, as it is imperative that you return to your primary care physician (or establish a relationship with a primary care physician if you do not have one).   Increase activity slowly   Complete by: As directed        Discharge Medications:   Allergies as of 09/12/2022       Reactions   Codeine Itching   Other Reaction(s): Not available   Atorvastatin Other (See Comments)   Unknown; not listed on MAR   Aspirin Other (See Comments)   Reaction: stomach ache Other Reaction(s): Not available   Morphine Other (See Comments)   "severe headache" Other Reaction(s): Not available   Nsaids    STOMACH PAINS        Medication List     STOP taking these medications    lisinopril 20 MG tablet Commonly known as: ZESTRIL       TAKE these medications    acetaminophen 500 MG tablet Commonly known as: TYLENOL Take 500 mg by mouth every 8 (eight) hours as needed for moderate pain.   amLODipine 10 MG tablet Commonly known as: NORVASC Take 1 tablet (10 mg total) by mouth daily.   Aricept 10 MG tablet Generic drug: donepezil Take 10 mg by mouth at bedtime.   bismuth subsalicylate 262 MG/15ML  suspension Commonly known as: PEPTO BISMOL Take 30 mLs by mouth every 6 (six) hours as needed for indigestion or diarrhea or loose stools.   buPROPion 300 MG 24 hr tablet Commonly known as: WELLBUTRIN XL Take 300 mg by mouth daily.   busPIRone 5 MG tablet Commonly known as: BUSPAR Take 5 mg by mouth 2 (two) times daily.   dicyclomine 20 MG tablet Commonly known as: BENTYL Take 20 mg by mouth 3 (three) times daily before meals.   Eszopiclone 3 MG Tabs Take 3 mg by mouth at bedtime. Take immediately before bedtime   guaifenesin 100 MG/5ML syrup Commonly known as: ROBITUSSIN Take 10 mLs by mouth every 6 (six) hours as needed  for cough. Not to exceed 4 doses in 24 hours   hydrochlorothiazide 12.5 MG tablet Commonly known as: HYDRODIURIL Take 1 tablet (12.5 mg total) by mouth daily. Start taking on: September 13, 2022   loratadine 10 MG tablet Commonly known as: CLARITIN Take 10 mg by mouth daily.   omeprazole 20 MG capsule Commonly known as: PRILOSEC Take 20 mg by mouth 2 (two) times daily before a meal.   ondansetron 8 MG disintegrating tablet Commonly known as: ZOFRAN-ODT Take 8 mg by mouth every 6 (six) hours as needed for nausea or vomiting.   pregabalin 100 MG capsule Commonly known as: LYRICA Take 100 mg by mouth 3 (three) times daily.   senna 8.6 MG Tabs tablet Commonly known as: SENOKOT Take 1 tablet by mouth daily.   sertraline 50 MG tablet Commonly known as: ZOLOFT Take 50 mg by mouth every evening.   traZODone 50 MG tablet Commonly known as: DESYREL Take 50 mg by mouth at bedtime.         The results of significant diagnostics from this hospitalization (including imaging, microbiology, ancillary and laboratory) are listed below for reference.    Procedures and Diagnostic Studies:   MR ABDOMEN MRCP W WO CONTAST  Result Date: 09/10/2022 CLINICAL DATA:  Right upper quadrant abdominal pain, nondiagnostic ultrasound, biliary ductal dilatation EXAM: MRI  ABDOMEN WITHOUT AND WITH CONTRAST (INCLUDING MRCP) TECHNIQUE: Multiplanar multisequence MR imaging of the abdomen was performed both before and after the administration of intravenous contrast. Heavily T2-weighted images of the biliary and pancreatic ducts were obtained, and three-dimensional MRCP images were rendered by post processing. CONTRAST:  10mL GADAVIST GADOBUTROL 1 MMOL/ML IV SOLN COMPARISON:  Right upper quadrant ultrasound, 09/10/2022 CT abdomen pelvis, 07/18/2019 FINDINGS: Lower chest: No acute abnormality. Hepatobiliary: No solid liver abnormality is seen. Distended gallbladder. No gallstones. Intra and extrahepatic biliary ductal dilatation, the common bile duct measuring up to 1.2 cm in caliber. The duct tapers smoothly within the pancreatic head but is effaced above the level of the ampulla (series 3, image 15, series 5, image 27). Pancreas: Subtly hypoenhancing ill-defined lesion of the ventral pancreatic head measuring 2.8 x 2.1 cm (series 19, image 57). Mild dilatation of the pancreatic duct in the pancreatic neck measuring up to 0.5 cm (series 4, image 21). Spleen: Normal in size without significant abnormality. Adrenals/Urinary Tract: Adrenal glands are unremarkable. Kidneys are normal, without renal calculi, solid lesion, or hydronephrosis. Stomach/Bowel: Stomach is within normal limits. No evidence of bowel wall thickening, distention, or inflammatory changes. Vascular/Lymphatic: No significant vascular findings are present. No enlarged abdominal lymph nodes. Other: No abdominal wall hernia or abnormality. No ascites. Musculoskeletal: No acute or significant osseous findings. IMPRESSION: 1. Subtly hypoenhancing ill-defined lesion of the ventral pancreatic head measuring 2.8 x 2.1 cm, highly suspicious for pancreatic adenocarcinoma. 2. Intra and extrahepatic biliary ductal dilatation, the common bile duct measuring up to 1.2 cm in caliber. The duct tapers smoothly within the pancreatic head  but is effaced above the level of the ampulla. Mild dilatation of the pancreatic duct in the pancreatic neck measuring up to 0.5 cm. 3. No evidence of lymphadenopathy or metastatic disease in the abdomen. These results will be called to the ordering clinician or representative by the Radiologist Assistant, and communication documented in the PACS or [employer]. Electronically Signed   By: Jearld Lesch M.D.   On: 09/10/2022 15:39   MR 3D Recon At Scanner  Result Date: 09/10/2022 CLINICAL DATA:  Right upper quadrant abdominal  pain, nondiagnostic ultrasound, biliary ductal dilatation EXAM: MRI ABDOMEN WITHOUT AND WITH CONTRAST (INCLUDING MRCP) TECHNIQUE: Multiplanar multisequence MR imaging of the abdomen was performed both before and after the administration of intravenous contrast. Heavily T2-weighted images of the biliary and pancreatic ducts were obtained, and three-dimensional MRCP images were rendered by post processing. CONTRAST:  10mL GADAVIST GADOBUTROL 1 MMOL/ML IV SOLN COMPARISON:  Right upper quadrant ultrasound, 09/10/2022 CT abdomen pelvis, 07/18/2019 FINDINGS: Lower chest: No acute abnormality. Hepatobiliary: No solid liver abnormality is seen. Distended gallbladder. No gallstones. Intra and extrahepatic biliary ductal dilatation, the common bile duct measuring up to 1.2 cm in caliber. The duct tapers smoothly within the pancreatic head but is effaced above the level of the ampulla (series 3, image 15, series 5, image 27). Pancreas: Subtly hypoenhancing ill-defined lesion of the ventral pancreatic head measuring 2.8 x 2.1 cm (series 19, image 57). Mild dilatation of the pancreatic duct in the pancreatic neck measuring up to 0.5 cm (series 4, image 21). Spleen: Normal in size without significant abnormality. Adrenals/Urinary Tract: Adrenal glands are unremarkable. Kidneys are normal, without renal calculi, solid lesion, or hydronephrosis. Stomach/Bowel: Stomach is within normal limits. No  evidence of bowel wall thickening, distention, or inflammatory changes. Vascular/Lymphatic: No significant vascular findings are present. No enlarged abdominal lymph nodes. Other: No abdominal wall hernia or abnormality. No ascites. Musculoskeletal: No acute or significant osseous findings. IMPRESSION: 1. Subtly hypoenhancing ill-defined lesion of the ventral pancreatic head measuring 2.8 x 2.1 cm, highly suspicious for pancreatic adenocarcinoma. 2. Intra and extrahepatic biliary ductal dilatation, the common bile duct measuring up to 1.2 cm in caliber. The duct tapers smoothly within the pancreatic head but is effaced above the level of the ampulla. Mild dilatation of the pancreatic duct in the pancreatic neck measuring up to 0.5 cm. 3. No evidence of lymphadenopathy or metastatic disease in the abdomen. These results will be called to the ordering clinician or representative by the Radiologist Assistant, and communication documented in the PACS or [employer]. Electronically Signed   By: Jearld Lesch M.D.   On: 09/10/2022 15:39   US Abdomen Limited RUQ (LIVER/GB)  Result Date: 09/10/2022 CLINICAL DATA:  Elevated LFTs. EXAM: ULTRASOUND ABDOMEN LIMITED RIGHT UPPER QUADRANT COMPARISON:  CT abdomen 07/18/1999 FINDINGS: Gallbladder: Moderate distention of the gallbladder without wall thickening or gallstones. Reportedly, no sonographic Murphy sign. Common bile duct: Diameter: 1.5 cm Liver: Increased echogenicity. No focal lesion. At least mild intrahepatic biliary dilatation. Liver is heterogeneous. Portal vein is patent on color Doppler imaging with normal direction of blood flow towards the liver. Other: None. IMPRESSION: 1. Distended gallbladder without gallstones. 2. Dilated common bile duct and intrahepatic biliary dilatation. Findings concerning for a biliary obstruction. Recommend further evaluation with MRCP. 3. Increased echogenicity of the liver is suggestive for hepatic steatosis. Electronically  Signed   By: Richarda Overlie M.D.   On: 09/10/2022 12:10     Labs:   Basic Metabolic Panel: Recent Labs  Lab 09/09/22 2219 09/10/22 0450 09/11/22 0505 09/12/22 0519  NA 139 138 135 137  K 3.9 4.1 3.7 3.8  CL 109 105 103 104  CO2 20* 24 22 25   GLUCOSE 96 192* 137* 104*  BUN 8 10 13 13   CREATININE 0.69 0.70 0.68 0.63  CALCIUM 9.3 9.5 9.3 8.9   GFR Estimated Creatinine Clearance: 67.4 mL/min (by C-G formula based on SCr of 0.63 mg/dL). Liver Function Tests: Recent Labs  Lab 09/09/22 2219 09/10/22 0450 09/11/22 0505 09/12/22 0519  AST  72* 74* 53* 67*  ALT 132* 130* 102* 93*  ALKPHOS 305* 320* 251* 199*  BILITOT 1.4* 1.2 1.2 1.2  PROT 7.7 7.8 7.4 6.7  ALBUMIN 4.0 3.9 3.8 3.6   No results for input(s): "LIPASE", "AMYLASE" in the last 168 hours. No results for input(s): "AMMONIA" in the last 168 hours. Coagulation profile No results for input(s): "INR", "PROTIME" in the last 168 hours.  CBC: Recent Labs  Lab 09/09/22 2219 09/10/22 0450 09/11/22 0505 09/12/22 0519  WBC 9.6 9.4 11.8* 9.2  NEUTROABS 5.2 7.5 8.1* 5.3  HGB 13.1 13.6 12.5 12.9  HCT 40.8 43.2 40.2 40.9  MCV 97.6 98.9 98.3 99.5  PLT 214 221 228 218   Cardiac Enzymes: No results for input(s): "CKTOTAL", "CKMB", "CKMBINDEX", "TROPONINI" in the last 168 hours. BNP: Invalid input(s): "POCBNP" CBG: No results for input(s): "GLUCAP" in the last 168 hours. D-Dimer No results for input(s): "DDIMER" in the last 72 hours. Hgb A1c No results for input(s): "HGBA1C" in the last 72 hours. Lipid Profile No results for input(s): "CHOL", "HDL", "LDLCALC", "TRIG", "CHOLHDL", "LDLDIRECT" in the last 72 hours. Thyroid function studies No results for input(s): "TSH", "T4TOTAL", "T3FREE", "THYROIDAB" in the last 72 hours.  Invalid input(s): "FREET3" Anemia work up No results for input(s): "VITAMINB12", "FOLATE", "FERRITIN", "TIBC", "IRON", "RETICCTPCT" in the last 72 hours. Microbiology No results found for this or  any previous visit (from the past 240 hour(s)).  Time coordinating discharge: 45 minutes  Signed: Avri Paiva  [facility] 09/12/2022, 11:58 AM

## 2022-09-12 NOTE — Progress Notes (Signed)
   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

## 2022-09-13 LAB — CANCER ANTIGEN 19-9: CA 19-9: 14 U/mL (ref 0–35)

## 2022-09-14 ENCOUNTER — Telehealth: Payer: Self-pay | Admitting: Gastroenterology

## 2022-09-14 LAB — CYTOLOGY - NON PAP

## 2022-09-14 NOTE — Telephone Encounter (Signed)
Inbound call from patient's daughter requesting to have a call back to her at [phone] when recent procedure results are in. Please advise, thank you.

## 2022-09-14 NOTE — Telephone Encounter (Signed)
I spoke with the daughter and read her the letter. She thanked me for the call. She is aware we will contact her when the pancreas results are available.   Anisa Leanos Haggard                                                                                                     09/12/2022 [address] [place] [address]                                                                                        Dear Ms. Kearse,   I am writing to inform you that the biopsies taken during your recent endoscopic examination showed:     FINAL MICROSCOPIC DIAGNOSIS:  A. 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  Negative for villous atrophy or increased intraepithelial lymphocytes.  Negative for malignancy.  B. STOMACH, BIOPSY:  Unremarkable antral and oxyntic mucosa.  Negative for Helicobacter pylori.  C. ESOPHAGUS, BIOPSY:  Gastroesophageal mucosa with mild chronic inflammation consistent with  reflux.  Negative for intestinal metaplasia or dysplasia.     What does this all mean? The duodenal biopsy showed evidence of inflammation known as duodenitis.  No evidence of celiac disease or precancerous changes were noted.   The stomach biopsies showed no evidence of H. pylori infection or precancerous changes.   The esophagus biopsies showed acid reflux related inflammation but no evidence of Barrett's esophagus.   The pancreatic lesion biopsies are still pending at this time, you will be updated when these do return.   Please call us at [phone] if you have persistent problems or have questions about your condition that have not been fully answered at this time.   Sincerely,   Lemar Lofty., MD

## 2022-09-18 ENCOUNTER — Other Ambulatory Visit: Payer: Self-pay

## 2022-09-18 ENCOUNTER — Telehealth: Payer: Self-pay

## 2022-09-18 ENCOUNTER — Encounter: Payer: Self-pay | Admitting: *Deleted

## 2022-09-18 DIAGNOSIS — C259 Malignant neoplasm of pancreas, unspecified: Secondary | ICD-10-CM

## 2022-09-18 NOTE — Telephone Encounter (Signed)
See alternate note  

## 2022-09-18 NOTE — Progress Notes (Signed)
PATIENT NAVIGATOR PROGRESS NOTE  Name: [name] Date: 09/18/2022 MRN: [id number]  DOB: 1945/04/12   Reason for visit:  Introductory phone call  Comments:  Called and spoke with Ms [name] and reviewed instructions for CT scan in am prior to appt with Dr Truett Perna at 1:10pm.  Have requested STAT read of CT for review at appt.  Reviewed instructions to building and parking as well as contact number to call with any questions    Time spent counseling/coordinating care: 45-60 minutes

## 2022-09-18 NOTE — Telephone Encounter (Signed)
-----   Message from Nurse Jerral Ralph sent at 09/17/2022 10:49 AM EDT ----- Regarding: New pancreatic pt Hi Caera Enwright,  I spoke with Ms Kintzel's daughter re: New pt appt with Dr Truett Perna. She said that Dr Meridee Score was going to order staging scans.    Dr Truett Perna is out until Wednesday and I told her daughter that I would get her scheduled with Dr Truett Perna based on CT's scheduling.  She agreed to that timing for new pt appt.   Thank you for referral and I will keep an eye out for CTs schedule and then get her in next day with Dr Truett Perna.   Please let me know if you need anything from me   Thanks Laurena Spies [facility] Nurse Navigator [phone]

## 2022-09-19 ENCOUNTER — Telehealth: Payer: Self-pay

## 2022-09-19 ENCOUNTER — Ambulatory Visit (HOSPITAL_BASED_OUTPATIENT_CLINIC_OR_DEPARTMENT_OTHER)
Admission: RE | Admit: 2022-09-19 | Discharge: 2022-09-19 | Disposition: A | Discharge status: Home / Self Care | Payer: Medicare Other | Source: Ambulatory Visit | Attending: Gastroenterology | Admitting: Gastroenterology

## 2022-09-19 ENCOUNTER — Inpatient Hospital Stay (HOSPITAL_BASED_OUTPATIENT_CLINIC_OR_DEPARTMENT_OTHER): Payer: Medicare Other | Admitting: Oncology

## 2022-09-19 ENCOUNTER — Encounter: Payer: Self-pay | Admitting: *Deleted

## 2022-09-19 VITALS — BP 139/89 | HR 100 | Temp 97.7°F | Resp 18 | Ht 66.0 in | Wt 211.1 lb

## 2022-09-19 DIAGNOSIS — I10 Essential (primary) hypertension: Secondary | ICD-10-CM | POA: Insufficient documentation

## 2022-09-19 DIAGNOSIS — E785 Hyperlipidemia, unspecified: Secondary | ICD-10-CM | POA: Insufficient documentation

## 2022-09-19 DIAGNOSIS — C250 Malignant neoplasm of head of pancreas: Secondary | ICD-10-CM | POA: Insufficient documentation

## 2022-09-19 DIAGNOSIS — C259 Malignant neoplasm of pancreas, unspecified: Secondary | ICD-10-CM

## 2022-09-19 DIAGNOSIS — F17210 Nicotine dependence, cigarettes, uncomplicated: Secondary | ICD-10-CM | POA: Insufficient documentation

## 2022-09-19 DIAGNOSIS — R413 Other amnesia: Secondary | ICD-10-CM | POA: Insufficient documentation

## 2022-09-19 DIAGNOSIS — F32A Depression, unspecified: Secondary | ICD-10-CM | POA: Insufficient documentation

## 2022-09-19 DIAGNOSIS — G629 Polyneuropathy, unspecified: Secondary | ICD-10-CM | POA: Insufficient documentation

## 2022-09-19 DIAGNOSIS — Z8659 Personal history of other mental and behavioral disorders: Secondary | ICD-10-CM | POA: Insufficient documentation

## 2022-09-19 MED ORDER — IOHEXOL 300 MG/ML  SOLN
100.0000 mL | Freq: Once | INTRAMUSCULAR | Status: AC | PRN
  Administered 2022-09-19: 100 mL via INTRAVENOUS

## 2022-09-19 NOTE — Progress Notes (Signed)
[facility] New Patient Consult   Requesting MD: Lemar Lofty., Md [address] [place],  [place] [address]   [name] 77 y.o.  08-18-1945    Reason for Consult: Pancreas cancer   HPI: [name] presented to the emergency room on 09/09/2022 with acute onset tongue and facial swelling.  She was felt to have an angioedema reaction, potentially related to lisinopril.  She was treated with Solu-Medrol, Pepcid, and Benadryl with improvement.  She is admitted for further evaluation.  The admission chemistry panel was remarkable for elevated liver enzymes.  An abdominal ultrasound 09/10/2022 revealed a distended gallbladder with a dilated common duct and intrahepatic biliary dilation.  An MRI/MRCP on 09/10/2022 showed a distended gallbladder with intra hepatic and extrapelvic biliary duct dilation.  Duct tapers within the pancreas head and is effaced above the level of the ampulla.  A subtle 2.8 x 2.1 cm mass was noted in the pancreas head with mild dilation of the pancreatic duct.  No enlarged lymph nodes.  She underwent an upper endoscopy and EUS by Dr. Meridee Score on 09/11/2022.  Scattered salmon-colored mucosa was noted at 34-35 cm.  Erythema was noted in the stomach and second portion of the duodenum.  A 16 mm nodule was found in the second portion of the duodenum adjacent to the major papilla.  On ultrasound a 29 x 21 mm lesion was noted in the pancreas head with an irregular outer margin.  There is abutment of the SMV with an intact interface between the mass and the SMA, celiac trunk, and portal vein.  A fine-needle aspiration biopsy was performed.  The common duct is dilated.  Gallbladder was distended.  An enlarged lymph node in the porta hepatis had well-defined margins and measured 7 x 5 mm.  The lesion was staged as a T2 N0 tumor by ultrasound criteria. The biopsy of the pancreas head mass returned as adenocarcinoma.  Biopsies from the stomach was negative for H.  pylori, esophagus negative for intestinal metaplasia or dysplasia, and duodenum was negative for malignancy.  She was discharged to home on 09/12/2022.  She is here with her daughter, son, and daughter-in-law.  No specific complaint today.   Past Medical History:  Diagnosis Date   ALCOHOL ABUSE, HX OF    ANEMIA-NOS    ANXIETY    CERVICAL RADICULOPATHY, LEFT    5 BACK OPERATIONS AND 2 NECK FUSIONS   DEGENERATIVE DISC DISEASE, CERVICAL SPINE    DEGENERATIVE DISC DISEASE, CERVICAL SPINE, W/RADICULOPATHY    Depressive disorder, not elsewhere classified    Depressive type psychosis    DIVERTICULITIS, HX OF    CLEARED ACCORDING TO PATIENT   DIZZINESS    GERD    HYPERLIPIDEMIA    HYPERTENSION    Insomnia, unspecified    LOW BACK PAIN    RUPTURED DISC   Memory loss    MIGRAINE HEADACHE 09/06/2017   NOT CURRENTLY   Palpitation    NOT A PROBLEM PER PATIENT   WEIGHT LOSS     .  G2, P2   .  Admission 09/09/2022 with angioedema   .  Neuropathy  Past Surgical History:  Procedure Laterality Date   BACK SURGERY  2008   X 5.Used her bone out of hips.   BIOPSY  09/11/2022   Procedure: BIOPSY;  Surgeon: Meridee Score Netty Starring., MD;  Location: [facility];  Service: Gastroenterology;;   CERVICAL FUSION     X 2, [facility] [place], [place]  DILATION AND CURETTAGE OF UTERUS     ESOPHAGOGASTRODUODENOSCOPY N/A 01/03/2015   Procedure: ESOPHAGOGASTRODUODENOSCOPY (EGD);  Surgeon: Elnita Maxwell, MD;  Location: [facility];  Service: Endoscopy;  Laterality: N/A;   ESOPHAGOGASTRODUODENOSCOPY N/A 09/11/2022   Procedure: ESOPHAGOGASTRODUODENOSCOPY (EGD);  Surgeon: Lemar Lofty., MD;  Location: [facility];  Service: Gastroenterology;  Laterality: N/A;   EYE SURGERY Bilateral    Cataract Extraction with IOL   FINE NEEDLE ASPIRATION N/A 09/11/2022   Procedure: FINE NEEDLE ASPIRATION (FNA) LINEAR;  Surgeon: Lemar Lofty., MD;  Location: [facility];  Service:  Gastroenterology;  Laterality: N/A;   HYSTEROSCOPY WITH D & C N/A 11/05/2016   Procedure: DILATATION AND CURETTAGE /HYSTEROSCOPY/MYOSURE RESECTION OF ENDOMETRIAL POLYP;  Surgeon: Schermerhorn, Ihor Austin, MD;  Location: [facility];  Service: Gynecology;  Laterality: N/A;   LUMBAR FUSION     TONSILLECTOMY     TOTAL HIP ARTHROPLASTY Right 08/04/2015   Procedure: TOTAL HIP ARTHROPLASTY ANTERIOR APPROACH;  Surgeon: Kennedy Bucker, MD;  Location: [facility];  Service: Orthopedics;  Laterality: Right;   TOTAL HIP ARTHROPLASTY Left 09/10/2017   Procedure: TOTAL HIP ARTHROPLASTY ANTERIOR APPROACH;  Surgeon: Kennedy Bucker, MD;  Location: [facility];  Service: Orthopedics;  Laterality: Left;   UPPER ESOPHAGEAL ENDOSCOPIC ULTRASOUND (EUS) N/A 09/11/2022   Procedure: UPPER ESOPHAGEAL ENDOSCOPIC ULTRASOUND (EUS);  Surgeon: Lemar Lofty., MD;  Location: [facility];  Service: Gastroenterology;  Laterality: N/A;    Medications: Reviewed  Allergies:  Allergies  Allergen Reactions   Codeine Itching    Other Reaction(s): Not available   Atorvastatin Other (See Comments)    Unknown; not listed on MAR   Lisinopril Other (See Comments)    Angioedema   Aspirin Other (See Comments)    Reaction: stomach ache  Other Reaction(s): Not available   Morphine Other (See Comments)    "severe headache"  Other Reaction(s): Not available   Nsaids     STOMACH PAINS    Family history: Family history of cancer  Social History:   She lives in assisted living at [facility].  She smokes cigarettes.  She has a remote history of heavy alcohol use, quit in 2011, no risk factor for HIV or hepatitis.  She has worked in various office occupations in the past.  ROS:   Positives include: Left upper abdominal pain for years, hand neuropathy, memory loss  A complete ROS was otherwise negative.  Physical Exam:  Blood pressure 139/89, pulse 100, temperature 97.7 F (36.5 C), temperature source Oral, resp. rate 18,  height 5\' 6"  (1.676 m), weight 211 lb 1.6 oz (95.8 kg), SpO2 98%.  HEENT: Oropharynx without visible mass, neck without mass Lungs: Lungs clear bilaterally Cardiac: Regular rate and rhythm Abdomen: No hepatosplenomegaly, no mass, nontender  Vascular: No leg edema Lymph nodes: No cervical, supraclavicular, axillary, or inguinal nodes Neurologic: Alert, oriented to year and place, not day Skin: No rash Musculoskeletal: No spine tenderness   LAB:  CBC  Lab Results  Component Value Date   WBC 9.2 09/12/2022   HGB 12.9 09/12/2022   HCT 40.9 09/12/2022   MCV 99.5 09/12/2022   PLT 218 09/12/2022   NEUTROABS 5.3 09/12/2022        CMP  Lab Results  Component Value Date   NA 137 09/12/2022   K 3.8 09/12/2022   CL 104 09/12/2022   CO2 25 09/12/2022   GLUCOSE 104 (H) 09/12/2022   BUN 13 09/12/2022   CREATININE 0.63 09/12/2022   CALCIUM 8.9 09/12/2022  PROT 6.7 09/12/2022   ALBUMIN 3.6 09/12/2022   AST 67 (H) 09/12/2022   ALT 93 (H) 09/12/2022   ALKPHOS 199 (H) 09/12/2022   BILITOT 1.2 09/12/2022   GFRNONAA >60 09/12/2022   GFRAA >60 07/18/2019     Lab Results  Component Value Date   MVH846 14 09/11/2022    Imaging:  CT CHEST ABDOMEN PELVIS W CONTRAST  Result Date: 09/19/2022 CLINICAL DATA:  Recently diagnosed pancreatic carcinoma. Staging. * Tracking Code: BO * EXAM: CT CHEST, ABDOMEN, AND PELVIS WITH CONTRAST TECHNIQUE: Multidetector CT imaging of the chest, abdomen and pelvis was performed following the standard protocol during bolus administration of intravenous contrast. RADIATION DOSE REDUCTION: This exam was performed according to the departmental dose-optimization program which includes automated exposure control, adjustment of the mA and/or kV according to patient size and/or use of iterative reconstruction technique. CONTRAST:  OMNIPAQUE IOHEXOL 300 MG/ML  SOLN COMPARISON:  MRI on 09/10/2022 FINDINGS: CT CHEST FINDINGS Cardiovascular: No acute findings.  Aortic and coronary atherosclerotic calcification incidentally noted. Mediastinum/Lymph Nodes: No masses or pathologically enlarged lymph nodes identified. Lungs/Pleura: No suspicious pulmonary nodules or masses identified. No evidence of infiltrate or pleural effusion. Musculoskeletal:  No suspicious bone lesions identified. CT ABDOMEN AND PELVIS FINDINGS Hepatobiliary: No masses identified. Gallbladder is distended, however there is no evidence of cholecystitis. Diffuse biliary ductal dilatation is again seen, with common bile duct measuring 14 mm. Pancreas: Ill-defined mass is seen in the pancreatic head and uncinate process which measures 4.5 x 3.3 cm on image 69/2. This obstructs the distal common bile duct. Mild pancreatic ductal dilatation is also seen. No evidence of vascular encasement or invasion. Spleen:  Within normal limits in size and appearance. Adrenals/Urinary tract: No suspicious masses or hydronephrosis. Stomach/Bowel: No evidence of obstruction, inflammatory process, or abnormal fluid collections. Normal appendix visualized. Vascular/Lymphatic: No pathologically enlarged lymph nodes identified. No acute vascular findings. Aortic atherosclerotic calcification incidentally noted. Reproductive: Limited visualization noted due to severe artifact from bilateral hip prostheses. Other:  None. Musculoskeletal: No suspicious bone lesions identified. Old T12 vertebral body compression deformity noted. Fusion hardware noted in the cervical and lumbar spine. Bilateral hip prostheses also noted. IMPRESSION: 4.5 cm mass in the pancreatic head and uncinate process, consistent with pancreatic carcinoma. No evidence of vascular involvement. Mild diffuse biliary and pancreatic ductal dilatation. No evidence of metastatic disease within the chest, abdomen, or pelvis. Aortic Atherosclerosis (ICD10-I70.0). Electronically Signed   By: Danae Orleans M.D.   On: 09/19/2022 12:00    CT and MRI images reviewed with Ms.  Luckman and her family  Assessment/Plan:   Adenocarcinoma the pancreas Elevated liver enzymes 09/09/2022 Ultrasound abdomen 09/10/2022-distended gallbladder without gallstones, dilated common bile duct and intrahepatic biliary dilation MRI abdomen/MRCP 715 2024-2.8 x 2.1 cm pancreas head lesion, intra and extrahepatic biliary duct dilation, mild dilation of the pancreatic duct, no lymphadenopathy or evidence of metastatic disease EUS 09/11/2022-masslike region in the pancreas head 29 x 21 mm, T2 N0, dilation of the common bile duct and distended gallbladder, enlarged lymph node in the porta hepatis with benign appearance pancreas mass abuts the SMV From a biopsy of pancreas mass 09/11/2022-adenocarcinoma CTs 09/19/2022-Fuhs biliary duct dilation, distended gallbladder, ill-defined mass in the pancreas head measuring 4.5 x 3.3 cm with obstruction of the distal common bile duct and mild pancreatic duct dilation, no evidence of vascular encasement or invasion, no pathologically enlarged lymph nodes Angioedema 09/09/2022-lisinopril related? History of heavy alcohol use, quit in 2011 Ongoing tobacco use Depression Hyperlipidemia  Degenerative disc disease Hypertension Cognitive dysfunction, memory loss Neuropathy   Disposition:   Ms. Keleher was admitted on 09/09/2022 with an episode of angioedema.  The angioedema may have been related to lisinopril therapy.  She was noted to have elevated liver enzymes and subsequent evaluation revealed a pancreas head mass with a biopsy confirming adenocarcinoma.  Ms. Rumberger has been diagnosed with adenocarcinoma the pancreas.  She appears to have disease localized to the pancreas head based on the staging evaluation to date.  She is asymptomatic.  There is obstruction of the common bile duct with intrahepatic duct dilation.  I suspect she will develop jaundice.  I will contact Dr.Mansouraty to discuss placement of a bile duct stent.  I discussed the treatment of pancreas  cancer with Ms. Manrique and her family.  They understand the only potentially curative therapy is surgery.  She appears to have significant dementia which may preclude surgery.  I will present her case at the GI tumor conference next week.  We will consider observation, chemotherapy, and radiation if she is not a surgical candidate.  She will return for office visit and further discussion in 2 weeks.  Thornton Papas, MD  09/19/2022, 2:25 PM

## 2022-09-19 NOTE — Progress Notes (Signed)
PATIENT NAVIGATOR PROGRESS NOTE  Name: [name] Date: 09/19/2022 MRN: [id number]  DOB: 1946-01-11   Reason for visit:  New pt appt  Comments:  Met with [name] and family during visit with Dr Truett Perna Referral made to Genetics Will order [facility] kit at next appt, no blood draw today Will add her to GI conference next week for full discussion Dr Truett Perna recommends stenting of bile duct due to dilatation evident on CT scan Given Journeys notebook with notes from today's visit and cancer specific information included as well as contact information    Time spent counseling/coordinating care: > 60 minutes

## 2022-09-19 NOTE — Telephone Encounter (Signed)
-----   Message from [facility] sent at 09/19/2022  4:52 PM EDT ----- Regarding: RE: New pt today [name], Put this patient on for August 22 ERCP. Lets plan for her to have repeat LFTs next week. If the LFTs have significantly worsen or she is becoming jaundiced then she may need to be done in the hospital, but hopefully not that the case. Thanks. GM ----- Message ----- From: Ardell Isaacs, RN Sent: 09/19/2022   2:50 PM EDT To: Loretha Stapler, RN; Ladene Artist, MD; # Subject: New pt today                                   Dr Meridee Score,  Dr Truett Perna agrees for need of stenting of bile duct soon based on CT scan from today for palliation.  Will present her case next week at GI conference.    Treatment recommendations will likely be surveillance vs chemo vs radiation.  Unlikely that she is a surgical candidate based on co morbidities.  Dr Truett Perna is going to see her back on 8/7.  Thank you for the referral,  Marylouise Stacks [facility] Nurse Navigator [phone]

## 2022-09-20 ENCOUNTER — Other Ambulatory Visit: Payer: Self-pay

## 2022-09-20 DIAGNOSIS — C259 Malignant neoplasm of pancreas, unspecified: Secondary | ICD-10-CM

## 2022-09-20 NOTE — Telephone Encounter (Signed)
ERCP has been scheduled for 10/18/22 at 130 pm at [facility] with GM

## 2022-09-20 NOTE — Telephone Encounter (Signed)
The pt daughter has been advised of the ERCP appt and labs.  No questions at this time

## 2022-09-24 ENCOUNTER — Other Ambulatory Visit: Payer: Self-pay | Admitting: *Deleted

## 2022-09-24 DIAGNOSIS — C250 Malignant neoplasm of head of pancreas: Secondary | ICD-10-CM

## 2022-09-25 ENCOUNTER — Inpatient Hospital Stay: Payer: Medicare Other

## 2022-09-25 ENCOUNTER — Inpatient Hospital Stay (HOSPITAL_COMMUNITY)
Admission: EM | Admit: 2022-09-25 | Discharge: 2022-09-27 | DRG: 445 | Disposition: A | Discharge status: Home / Self Care | Payer: Medicare Other | Source: Ambulatory Visit | Attending: Internal Medicine | Admitting: Internal Medicine

## 2022-09-25 ENCOUNTER — Other Ambulatory Visit: Payer: Self-pay

## 2022-09-25 ENCOUNTER — Telehealth: Payer: Self-pay | Admitting: *Deleted

## 2022-09-25 ENCOUNTER — Encounter (HOSPITAL_COMMUNITY): Payer: Self-pay

## 2022-09-25 ENCOUNTER — Other Ambulatory Visit: Payer: Self-pay | Admitting: *Deleted

## 2022-09-25 ENCOUNTER — Encounter: Payer: Self-pay | Admitting: *Deleted

## 2022-09-25 DIAGNOSIS — F1011 Alcohol abuse, in remission: Secondary | ICD-10-CM | POA: Diagnosis present

## 2022-09-25 DIAGNOSIS — Z96643 Presence of artificial hip joint, bilateral: Secondary | ICD-10-CM | POA: Diagnosis present

## 2022-09-25 DIAGNOSIS — F0390 Unspecified dementia without behavioral disturbance: Secondary | ICD-10-CM | POA: Diagnosis not present

## 2022-09-25 DIAGNOSIS — K831 Obstruction of bile duct: Secondary | ICD-10-CM | POA: Diagnosis not present

## 2022-09-25 DIAGNOSIS — R7989 Other specified abnormal findings of blood chemistry: Secondary | ICD-10-CM | POA: Diagnosis not present

## 2022-09-25 DIAGNOSIS — Z885 Allergy status to narcotic agent status: Secondary | ICD-10-CM | POA: Diagnosis not present

## 2022-09-25 DIAGNOSIS — Z888 Allergy status to other drugs, medicaments and biological substances status: Secondary | ICD-10-CM

## 2022-09-25 DIAGNOSIS — Z6833 Body mass index (BMI) 33.0-33.9, adult: Secondary | ICD-10-CM | POA: Diagnosis not present

## 2022-09-25 DIAGNOSIS — F0394 Unspecified dementia, unspecified severity, with anxiety: Secondary | ICD-10-CM | POA: Diagnosis present

## 2022-09-25 DIAGNOSIS — L299 Pruritus, unspecified: Secondary | ICD-10-CM | POA: Diagnosis present

## 2022-09-25 DIAGNOSIS — K838 Other specified diseases of biliary tract: Secondary | ICD-10-CM | POA: Diagnosis not present

## 2022-09-25 DIAGNOSIS — Z886 Allergy status to analgesic agent status: Secondary | ICD-10-CM

## 2022-09-25 DIAGNOSIS — R590 Localized enlarged lymph nodes: Secondary | ICD-10-CM | POA: Diagnosis present

## 2022-09-25 DIAGNOSIS — Z79899 Other long term (current) drug therapy: Secondary | ICD-10-CM | POA: Diagnosis not present

## 2022-09-25 DIAGNOSIS — E669 Obesity, unspecified: Secondary | ICD-10-CM | POA: Diagnosis present

## 2022-09-25 DIAGNOSIS — E871 Hypo-osmolality and hyponatremia: Secondary | ICD-10-CM | POA: Diagnosis present

## 2022-09-25 DIAGNOSIS — Z809 Family history of malignant neoplasm, unspecified: Secondary | ICD-10-CM

## 2022-09-25 DIAGNOSIS — Z981 Arthrodesis status: Secondary | ICD-10-CM | POA: Diagnosis not present

## 2022-09-25 DIAGNOSIS — K219 Gastro-esophageal reflux disease without esophagitis: Secondary | ICD-10-CM | POA: Diagnosis present

## 2022-09-25 DIAGNOSIS — R748 Abnormal levels of other serum enzymes: Secondary | ICD-10-CM | POA: Diagnosis present

## 2022-09-25 DIAGNOSIS — C250 Malignant neoplasm of head of pancreas: Secondary | ICD-10-CM | POA: Diagnosis present

## 2022-09-25 DIAGNOSIS — I10 Essential (primary) hypertension: Secondary | ICD-10-CM | POA: Diagnosis present

## 2022-09-25 DIAGNOSIS — Z811 Family history of alcohol abuse and dependence: Secondary | ICD-10-CM

## 2022-09-25 DIAGNOSIS — F17210 Nicotine dependence, cigarettes, uncomplicated: Secondary | ICD-10-CM | POA: Diagnosis not present

## 2022-09-25 DIAGNOSIS — F0393 Unspecified dementia, unspecified severity, with mood disturbance: Secondary | ICD-10-CM | POA: Diagnosis present

## 2022-09-25 DIAGNOSIS — E876 Hypokalemia: Secondary | ICD-10-CM | POA: Diagnosis not present

## 2022-09-25 DIAGNOSIS — E785 Hyperlipidemia, unspecified: Secondary | ICD-10-CM | POA: Diagnosis present

## 2022-09-25 DIAGNOSIS — K2270 Barrett's esophagus without dysplasia: Secondary | ICD-10-CM | POA: Diagnosis present

## 2022-09-25 DIAGNOSIS — C259 Malignant neoplasm of pancreas, unspecified: Secondary | ICD-10-CM | POA: Diagnosis not present

## 2022-09-25 DIAGNOSIS — R0602 Shortness of breath: Secondary | ICD-10-CM | POA: Diagnosis not present

## 2022-09-25 LAB — CBC WITH DIFFERENTIAL (CANCER CENTER ONLY)
Abs Immature Granulocytes: 0.09 10*3/uL — ABNORMAL HIGH (ref 0.00–0.07)
Basophils Absolute: 0.1 10*3/uL (ref 0.0–0.1)
Basophils Relative: 1 %
Eosinophils Absolute: 0.1 10*3/uL (ref 0.0–0.5)
Eosinophils Relative: 1 %
HCT: 42.7 % (ref 36.0–46.0)
Hemoglobin: 13.8 g/dL (ref 12.0–15.0)
Immature Granulocytes: 1 %
Lymphocytes Relative: 27 %
Lymphs Abs: 2.6 10*3/uL (ref 0.7–4.0)
MCH: 31 pg (ref 26.0–34.0)
MCHC: 32.3 g/dL (ref 30.0–36.0)
MCV: 96 fL (ref 80.0–100.0)
Monocytes Absolute: 0.4 10*3/uL (ref 0.1–1.0)
Monocytes Relative: 4 %
Neutro Abs: 6.3 10*3/uL (ref 1.7–7.7)
Neutrophils Relative %: 66 %
Platelet Count: 232 10*3/uL (ref 150–400)
RBC: 4.45 MIL/uL (ref 3.87–5.11)
RDW: 14.2 % (ref 11.5–15.5)
WBC Count: 9.6 10*3/uL (ref 4.0–10.5)
nRBC: 0 % (ref 0.0–0.2)

## 2022-09-25 LAB — CMP (CANCER CENTER ONLY)
ALT: 131 U/L — ABNORMAL HIGH (ref 0–44)
AST: 62 U/L — ABNORMAL HIGH (ref 15–41)
Albumin: 4.6 g/dL (ref 3.5–5.0)
Alkaline Phosphatase: 384 U/L — ABNORMAL HIGH (ref 38–126)
Anion gap: 10 (ref 5–15)
BUN: 10 mg/dL (ref 8–23)
CO2: 24 mmol/L (ref 22–32)
Calcium: 9.9 mg/dL (ref 8.9–10.3)
Chloride: 96 mmol/L — ABNORMAL LOW (ref 98–111)
Creatinine: 0.99 mg/dL (ref 0.44–1.00)
GFR, Estimated: 58 mL/min — ABNORMAL LOW (ref >60)
Glucose, Bld: 237 mg/dL — ABNORMAL HIGH (ref 70–99)
Potassium: 4.1 mmol/L (ref 3.5–5.1)
Sodium: 130 mmol/L — ABNORMAL LOW (ref 135–145)
Total Bilirubin: 3.6 mg/dL (ref 0.3–1.2)
Total Protein: 7.8 g/dL (ref 6.5–8.1)

## 2022-09-25 LAB — CBC WITH DIFFERENTIAL/PLATELET
Abs Immature Granulocytes: 0.16 10*3/uL — ABNORMAL HIGH (ref 0.00–0.07)
Basophils Absolute: 0.1 10*3/uL (ref 0.0–0.1)
Basophils Relative: 1 %
Eosinophils Absolute: 0.1 10*3/uL (ref 0.0–0.5)
Eosinophils Relative: 1 %
HCT: 39.5 % (ref 36.0–46.0)
Hemoglobin: 12.6 g/dL (ref 12.0–15.0)
Immature Granulocytes: 2 %
Lymphocytes Relative: 39 %
Lymphs Abs: 4 10*3/uL (ref 0.7–4.0)
MCH: 30.7 pg (ref 26.0–34.0)
MCHC: 31.9 g/dL (ref 30.0–36.0)
MCV: 96.3 fL (ref 80.0–100.0)
Monocytes Absolute: 0.5 10*3/uL (ref 0.1–1.0)
Monocytes Relative: 5 %
Neutro Abs: 5.4 10*3/uL (ref 1.7–7.7)
Neutrophils Relative %: 52 %
Platelets: 211 10*3/uL (ref 150–400)
RBC: 4.1 MIL/uL (ref 3.87–5.11)
RDW: 14.1 % (ref 11.5–15.5)
WBC: 10.2 10*3/uL (ref 4.0–10.5)
nRBC: 0 % (ref 0.0–0.2)

## 2022-09-25 LAB — COMPREHENSIVE METABOLIC PANEL
ALT: 117 U/L — ABNORMAL HIGH (ref 0–44)
AST: 64 U/L — ABNORMAL HIGH (ref 15–41)
Albumin: 3.7 g/dL (ref 3.5–5.0)
Alkaline Phosphatase: 333 U/L — ABNORMAL HIGH (ref 38–126)
Anion gap: 10 (ref 5–15)
BUN: 13 mg/dL (ref 8–23)
CO2: 22 mmol/L (ref 22–32)
Calcium: 8.9 mg/dL (ref 8.9–10.3)
Chloride: 101 mmol/L (ref 98–111)
Creatinine, Ser: 1.04 mg/dL — ABNORMAL HIGH (ref 0.44–1.00)
GFR, Estimated: 55 mL/min — ABNORMAL LOW (ref >60)
Glucose, Bld: 181 mg/dL — ABNORMAL HIGH (ref 70–99)
Potassium: 3.7 mmol/L (ref 3.5–5.1)
Sodium: 133 mmol/L — ABNORMAL LOW (ref 135–145)
Total Bilirubin: 3.2 mg/dL — ABNORMAL HIGH (ref 0.3–1.2)
Total Protein: 7.2 g/dL (ref 6.5–8.1)

## 2022-09-25 LAB — PHOSPHORUS: Phosphorus: 4 mg/dL (ref 2.5–4.6)

## 2022-09-25 LAB — PROTIME-INR
INR: 1 (ref 0.8–1.2)
Prothrombin Time: 13.6 seconds (ref 11.4–15.2)

## 2022-09-25 LAB — MAGNESIUM: Magnesium: 1.6 mg/dL — ABNORMAL LOW (ref 1.7–2.4)

## 2022-09-25 MED ORDER — SODIUM CHLORIDE 0.9 % IV SOLN: INTRAVENOUS | Status: AC

## 2022-09-25 MED ORDER — AMLODIPINE BESYLATE 10 MG PO TABS
10.0000 mg | ORAL_TABLET | Freq: Every day | ORAL | Status: DC
  Administered 2022-09-26 – 2022-09-27 (×2): 10 mg via ORAL
  Filled 2022-09-25 (×2): qty 1

## 2022-09-25 MED ORDER — ALBUTEROL SULFATE (2.5 MG/3ML) 0.083% IN NEBU: 2.5000 mg | INHALATION_SOLUTION | RESPIRATORY_TRACT | Status: DC | PRN

## 2022-09-25 MED ORDER — HYDROXYZINE HCL 25 MG PO TABS
25.0000 mg | ORAL_TABLET | Freq: Three times a day (TID) | ORAL | Status: DC | PRN
  Administered 2022-09-25 – 2022-09-26 (×2): 25 mg via ORAL
  Filled 2022-09-25 (×2): qty 1

## 2022-09-25 MED ORDER — ONDANSETRON HCL 4 MG/2ML IJ SOLN: 4.0000 mg | Freq: Four times a day (QID) | INTRAMUSCULAR | Status: DC | PRN

## 2022-09-25 MED ORDER — SIMVASTATIN 40 MG PO TABS
40.0000 mg | ORAL_TABLET | Freq: Every day | ORAL | Status: DC
  Administered 2022-09-26 – 2022-09-27 (×2): 40 mg via ORAL
  Filled 2022-09-25 (×2): qty 1

## 2022-09-25 MED ORDER — ONDANSETRON HCL 4 MG PO TABS: 4.0000 mg | ORAL_TABLET | Freq: Four times a day (QID) | ORAL | Status: DC | PRN

## 2022-09-25 MED ORDER — HEPARIN SODIUM (PORCINE) 5000 UNIT/ML IJ SOLN
5000.0000 [IU] | Freq: Three times a day (TID) | INTRAMUSCULAR | Status: DC
  Administered 2022-09-25: 5000 [IU] via SUBCUTANEOUS
  Filled 2022-09-25: qty 1

## 2022-09-25 NOTE — Telephone Encounter (Signed)
Case discussed with oncology service.  Reviewed liver biochemical testing as well as electrolytes. With her hyponatremia being present this may complicate timing of procedure. She now has overt biliary obstruction. Unfortunately I do not have access to any outpatient ERCP timing at this point in the next 2 weeks and the patient will need to come into the hospital for biliary decompression with the inpatient GI service. I discussed this with Dr. Alcide Evener as well and he will work on arranging a admission to the hospital where in which the [facility] service can be consulted and patient can be set up for ERCP. Thankfully no fevers are at play at this time so we have some time to optimize her electrolytes and figure out timing of this in the next few days.  Laurie Parish, MD [facility]oscopy Office # [phone]

## 2022-09-25 NOTE — Telephone Encounter (Addendum)
CRITICAL VALUE STICKER  CRITICAL VALUE: total bili= 3.6  RECEIVER (on-site recipient of call):Shayna Eblen,RN  DATE & TIME NOTIFIED: 09/25/22 @ 1115  MESSENGER (representative from lab):Sunni  MD NOTIFIED: Dr. Truett Perna  TIME OF NOTIFICATION:1119  RESPONSE:  Dr. Truett Perna forwarded results to GI, Dr. Meridee Score.

## 2022-09-25 NOTE — H&P (Addendum)
History and Physical    [name] BJY:[id number] DOB: Aug 19, 1945 DOA: 09/25/2022  PCP: Patient, No Pcp Per  Patient coming from: home/direct admit  I have personally briefly reviewed patient's old medical records in [facility]  Chief Complaint: biliary obstruction  HPI: [name] is a 77 y.o. female with medical history significant of HTN, HLD, anxiety/depression, GERD with barrett's esophagus, recent diagnosis of pancreatic cancer with CBD obstruction now with presenting progression of intra biliary  obstruction with worsening obstructive labs. Patient was seen as out patient however due to no available slots for out patient ERCP patient was admitted to expedite care.  Secondarily patient also noted to have hyponatremia. Per patient she has no n/v/d or abdominal pain. She however does have severe pruritus. She denies any fever/chills/ cough / sob / or chest pain.  Labs: Admit labs Mag 1.6 NA 130 repeat 133 was ( 137 at baseline ) CO 22, glu 181, cr 1.04 (0.99), AST 64, ALT 117, Alkphos 333, Tbili 3.2 GFR 55 WBC 10.2, hgb 12.6, plt 211 Vitals: afeb, bp 142/88, hr 93 , rr 17   7/24  CTMPRESSION: 4.5 cm mass in the pancreatic head and uncinate process, consistent with pancreatic carcinoma. No evidence of vascular involvement.   Mild diffuse biliary and pancreatic ductal dilatation.   No evidence of metastatic disease within the chest, abdomen, or pelvis.   Aortic Atherosclerosis (ICD10-I70.0).  Pathology adenocarcinoma of pancreas Review of Systems: As per HPI otherwise 10 point review of systems negative.   Past Medical History:  Diagnosis Date   ALCOHOL ABUSE, HX OF    ANEMIA-NOS    ANXIETY    CERVICAL RADICULOPATHY, LEFT    5 BACK OPERATIONS AND 2 NECK FUSIONS   DEGENERATIVE DISC DISEASE, CERVICAL SPINE    DEGENERATIVE DISC DISEASE, CERVICAL SPINE, W/RADICULOPATHY    Depressive disorder, not elsewhere classified    Depressive type psychosis     DIVERTICULITIS, HX OF    CLEARED ACCORDING TO PATIENT   DIZZINESS    GERD    HYPERLIPIDEMIA    HYPERTENSION    Insomnia, unspecified    LOW BACK PAIN    RUPTURED DISC   Memory loss    MIGRAINE HEADACHE 09/06/2017   NOT CURRENTLY   Palpitation    NOT A PROBLEM PER PATIENT   WEIGHT LOSS     Past Surgical History:  Procedure Laterality Date   BACK SURGERY  2008   X 5.Used her bone out of hips.   BIOPSY  09/11/2022   Procedure: BIOPSY;  Surgeon: Meridee Score Netty Starring., MD;  Location: [facility];  Service: Gastroenterology;;   CERVICAL FUSION     X 2, [place], [place], [place]   DILATION AND CURETTAGE OF UTERUS     ESOPHAGOGASTRODUODENOSCOPY N/A 01/03/2015   Procedure: ESOPHAGOGASTRODUODENOSCOPY (EGD);  Surgeon: Elnita Maxwell, MD;  Location: [facility] ENDOSCOPY;  Service: Endoscopy;  Laterality: N/A;   ESOPHAGOGASTRODUODENOSCOPY N/A 09/11/2022   Procedure: ESOPHAGOGASTRODUODENOSCOPY (EGD);  Surgeon: Lemar Lofty., MD;  Location: [facility];  Service: Gastroenterology;  Laterality: N/A;   EYE SURGERY Bilateral    Cataract Extraction with IOL   FINE NEEDLE ASPIRATION N/A 09/11/2022   Procedure: FINE NEEDLE ASPIRATION (FNA) LINEAR;  Surgeon: Lemar Lofty., MD;  Location: [facility] ENDOSCOPY;  Service: Gastroenterology;  Laterality: N/A;   HYSTEROSCOPY WITH D & C N/A 11/05/2016   Procedure: DILATATION AND CURETTAGE /HYSTEROSCOPY/MYOSURE RESECTION OF ENDOMETRIAL POLYP;  Surgeon: Schermerhorn, Ihor Austin, MD;  Location: [facility] ORS;  Service: Gynecology;  Laterality: N/A;   LUMBAR FUSION     TONSILLECTOMY     TOTAL HIP ARTHROPLASTY Right 08/04/2015   Procedure: TOTAL HIP ARTHROPLASTY ANTERIOR APPROACH;  Surgeon: Kennedy Bucker, MD;  Location: [facility] ORS;  Service: Orthopedics;  Laterality: Right;   TOTAL HIP ARTHROPLASTY Left 09/10/2017   Procedure: TOTAL HIP ARTHROPLASTY ANTERIOR APPROACH;  Surgeon: Kennedy Bucker, MD;  Location: [facility] ORS;  Service: Orthopedics;  Laterality: Left;    UPPER ESOPHAGEAL ENDOSCOPIC ULTRASOUND (EUS) N/A 09/11/2022   Procedure: UPPER ESOPHAGEAL ENDOSCOPIC ULTRASOUND (EUS);  Surgeon: Lemar Lofty., MD;  Location: [facility];  Service: Gastroenterology;  Laterality: N/A;     reports that she has been smoking cigarettes. She has never used smokeless tobacco. She reports that she does not drink alcohol and does not use drugs.  Allergies  Allergen Reactions   Codeine Itching    Other Reaction(s): Not available   Atorvastatin Other (See Comments)    Unknown; not listed on MAR   Lisinopril Other (See Comments)    Angioedema   Aspirin Other (See Comments)    Reaction: stomach ache  Other Reaction(s): Not available   Morphine Other (See Comments)    "severe headache"  Other Reaction(s): Not available   Nsaids     STOMACH PAINS    Family History  Problem Relation Age of Onset   Alcohol abuse Father    Depression Other    Alcohol abuse Other     Prior to Admission medications   Medication Sig Start Date End Date Taking? Authorizing Provider  acetaminophen (TYLENOL) 500 MG tablet Take 500 mg by mouth every 8 (eight) hours as needed for moderate pain.    [provider]  amLODipine (NORVASC) 10 MG tablet Take 1 tablet (10 mg total) by mouth daily. 09/12/22 10/12/22  Lorin Glass, MD  bismuth subsalicylate (PEPTO BISMOL) 262 MG/15ML suspension Take 30 mLs by mouth every 6 (six) hours as needed for indigestion or diarrhea or loose stools. Patient not taking: Reported on 09/19/2022    [provider]  buPROPion (WELLBUTRIN XL) 300 MG 24 hr tablet Take 300 mg by mouth daily.    [provider]  busPIRone (BUSPAR) 5 MG tablet Take 5 mg by mouth 2 (two) times daily.    [provider]  dicyclomine (BENTYL) 20 MG tablet Take 20 mg by mouth 3 (three) times daily before meals.    [provider]  donepezil (ARICEPT) 10 MG tablet Take 10 mg by mouth at bedtime.     [provider]   Eszopiclone (ESZOPICLONE) 3 MG TABS Take 3 mg by mouth at bedtime. Take immediately before bedtime    [provider]  guaifenesin (ROBITUSSIN) 100 MG/5ML syrup Take 10 mLs by mouth every 6 (six) hours as needed for cough. Not to exceed 4 doses in 24 hours    [provider]  hydrochlorothiazide (HYDRODIURIL) 12.5 MG tablet Take 1 tablet (12.5 mg total) by mouth daily. 09/13/22   Lorin Glass, MD  loratadine (CLARITIN) 10 MG tablet Take 10 mg by mouth daily.    [provider]  omeprazole (PRILOSEC) 20 MG capsule Take 20 mg by mouth 2 (two) times daily before a meal.    [provider]  ondansetron (ZOFRAN-ODT) 8 MG disintegrating tablet Take 8 mg by mouth every 6 (six) hours as needed for nausea or vomiting. Patient not taking: Reported on 09/19/2022    [provider]  pregabalin (LYRICA) 100 MG capsule Take 100  mg by mouth 3 (three) times daily.    [provider]  senna (SENOKOT) 8.6 MG TABS tablet Take 1 tablet by mouth daily.    [provider]  sertraline (ZOLOFT) 50 MG tablet Take 50 mg by mouth every evening.    [provider]  simvastatin (ZOCOR) 40 MG tablet Take 40 mg by mouth daily.    [provider]  SYSTANE BALANCE 0.6 % SOLN Apply 1 drop to eye 3 (three) times daily. 06/25/22   [provider]  traZODone (DESYREL) 50 MG tablet Take 50 mg by mouth at bedtime.    [provider]    Physical Exam: Vitals:   09/25/22 1812 09/25/22 1814  BP: (!) 142/88 (!) 142/88  Pulse: 93 93  Resp: 17 17  Temp: (!) 97.5 F (36.4 C) (!) 97.5 F (36.4 C)  TempSrc: Oral Oral  SpO2: 98%   Weight:  95.6 kg  Height:  5\' 5"  (1.651 m)    Constitutional: NAD, calm, comfortable/jaundice Vitals:   09/25/22 1812 09/25/22 1814  BP: (!) 142/88 (!) 142/88  Pulse: 93 93  Resp: 17 17  Temp: (!) 97.5 F (36.4 C) (!) 97.5 F (36.4 C)  TempSrc: Oral Oral  SpO2: 98%   Weight:  95.6 kg  Height:  5\' 5"   (1.651 m)   Eyes: PERRL, lids and conjunctivae normal ENMT: Mucous membranes are moist. Posterior pharynx clear of any exudate or lesions.Normal dentition.  Neck: normal, supple, no masses, no thyromegaly Respiratory: clear to auscultation bilaterally, no wheezing, no crackles. Normal respiratory effort. No accessory muscle use.  Cardiovascular: Regular rate and rhythm, no murmurs / rubs / gallops. No extremity edema. 2+ pedal pulses.  Abdomen: no tenderness, no masses palpated. No hepatosplenomegaly. Bowel sounds positive.  Musculoskeletal: no clubbing / cyanosis. No joint deformity upper and lower extremities. Good ROM, no contractures. Normal muscle tone.  Skin: no rashes, lesions, ulcers. No induration Neurologic: CN 2-12 grossly intact. Sensation intact,  Strength 5/5 in all 4.  Psychiatric: Normal judgment and insight. Alert and oriented Normal mood.    Labs on Admission: I have personally reviewed following labs and imaging studies  CBC: Recent Labs  Lab 09/25/22 1032  WBC 9.6  NEUTROABS 6.3  HGB 13.8  HCT 42.7  MCV 96.0  PLT 232   Basic Metabolic Panel: Recent Labs  Lab 09/25/22 1032  NA 130*  K 4.1  CL 96*  CO2 24  GLUCOSE 237*  BUN 10  CREATININE 0.99  CALCIUM 9.9   GFR: Estimated Creatinine Clearance: 53.5 mL/min (by C-G formula based on SCr of 0.99 mg/dL). Liver Function Tests: Recent Labs  Lab 09/25/22 1032  AST 62*  ALT 131*  ALKPHOS 384*  BILITOT 3.6*  PROT 7.8  ALBUMIN 4.6   No results for input(s): "LIPASE", "AMYLASE" in the last 168 hours. No results for input(s): "AMMONIA" in the last 168 hours. Coagulation Profile: Recent Labs  Lab 09/25/22 1032  INR 1.0   Cardiac Enzymes: No results for input(s): "CKTOTAL", "CKMB", "CKMBINDEX", "TROPONINI" in the last 168 hours. BNP (last 3 results) No results for input(s): "PROBNP" in the last 8760 hours. HbA1C: No results for input(s): "HGBA1C" in the last 72 hours. CBG: No results for  input(s): "GLUCAP" in the last 168 hours. Lipid Profile: No results for input(s): "CHOL", "HDL", "LDLCALC", "TRIG", "CHOLHDL", "LDLDIRECT" in the last 72 hours. Thyroid Function Tests: No results for input(s): "TSH", "T4TOTAL", "FREET4", "T3FREE", "THYROIDAB" in the last 72 hours. Anemia Panel:  No results for input(s): "VITAMINB12", "FOLATE", "FERRITIN", "TIBC", "IRON", "RETICCTPCT" in the last 72 hours. Urine analysis:    Component Value Date/Time   COLORURINE STRAW (A) 07/18/2019 0605   APPEARANCEUR CLEAR 07/18/2019 0605   LABSPEC 1.008 07/18/2019 0605   PHURINE 5.0 07/18/2019 0605   GLUCOSEU NEGATIVE 07/18/2019 0605   GLUCOSEU NEGATIVE 01/15/2011 1122   HGBUR NEGATIVE 07/18/2019 0605   BILIRUBINUR NEGATIVE 07/18/2019 0605   KETONESUR NEGATIVE 07/18/2019 0605   PROTEINUR NEGATIVE 07/18/2019 0605   UROBILINOGEN 0.2 01/15/2011 1122   NITRITE NEGATIVE 07/18/2019 0605   LEUKOCYTESUR NEGATIVE 07/18/2019 9629    Radiological Exams on Admission: No results found.  EKG: Independently reviewed. N/a  Assessment/Plan  Biliary Obstruction  -elevated lfts/bili -NPO midnight  - for ERCP and biliary stenting  in am  -patient no fever/ no chills / no n/v/d or abdominal pain  -supportive care for pruritus   Adenocarcinoma of Pancreas  disease localized to the pancreas head based  Tx would be surgery awaiting f/u with tumor board  Unclear at this time if patient is a good candidate as she has significant dementia  -followed by DR Truett Perna   Hyponatremia  -due to volume status and use of hydrochlorothiazide -hold hydrochlorothiazide   Hypertension  -continue amlodipine -hold hydrochlorothiazide due to hyponatremia  ETOH abuse  -in remission   GERD -ppi  Hx of angioedema due to Ace  -09/09/2022  Dementia -resume home regimen once med rec completed  Hypomagnesemia  -replete   Hyperglycemia -A1c  -monitor finger sticks   DVT prophylaxis: heparin Code Status: full/  as discussed per patient wishes in event of cardiac arrest  Family Communication:   Hobson,Sharon (Daughter) [phone] ([place])   Disposition Plan: patient  expected to be admitted greater than 2 midnights  Consults called: [facility]  to be called in am  Admission status: tele   Lurline Del MD [facility]   If 7PM-7AM, please contact night-coverage [email or web address] Password [email or web address]  09/25/2022, 6:42 PM

## 2022-09-25 NOTE — Progress Notes (Signed)
PATIENT NAVIGATOR PROGRESS NOTE  Name: [name] Date: 09/25/2022 MRN: [id number]  DOB: Oct 15, 1945   Reason for visit:  Increased itching and dark urine and light colored stools  Comments:  Per discussion with Dr Meridee Score and Dr Truett Perna labs were ordered and drawn today at [facility] and results showed significant increase in bilirubin to 3.6 from 1.2 13 days ago.   Dr Meridee Score did not have availability for stent placement and directed that pt should be admitted to [facility] for stent placement.  Hospital admisssions called and Dr Robb Matar, admitting hospitalist, spoke with Dr Truett Perna.  Patient accepted for admission to [facility] and daughter updated and awaiting phone call to bring [name] in for admission.  Dr Meridee Score team updated as well.     Time spent counseling/coordinating care: > 60 minutes

## 2022-09-26 ENCOUNTER — Other Ambulatory Visit: Payer: Self-pay | Admitting: *Deleted

## 2022-09-26 ENCOUNTER — Other Ambulatory Visit: Payer: Self-pay

## 2022-09-26 DIAGNOSIS — R7989 Other specified abnormal findings of blood chemistry: Secondary | ICD-10-CM | POA: Diagnosis not present

## 2022-09-26 DIAGNOSIS — I10 Essential (primary) hypertension: Secondary | ICD-10-CM

## 2022-09-26 DIAGNOSIS — C250 Malignant neoplasm of head of pancreas: Secondary | ICD-10-CM

## 2022-09-26 DIAGNOSIS — E876 Hypokalemia: Secondary | ICD-10-CM | POA: Diagnosis not present

## 2022-09-26 DIAGNOSIS — K831 Obstruction of bile duct: Secondary | ICD-10-CM | POA: Diagnosis not present

## 2022-09-26 DIAGNOSIS — F0390 Unspecified dementia without behavioral disturbance: Secondary | ICD-10-CM

## 2022-09-26 DIAGNOSIS — C259 Malignant neoplasm of pancreas, unspecified: Secondary | ICD-10-CM | POA: Diagnosis not present

## 2022-09-26 LAB — GLUCOSE, CAPILLARY
Glucose-Capillary: 175 mg/dL — ABNORMAL HIGH (ref 70–99)
Glucose-Capillary: 164 mg/dL — ABNORMAL HIGH (ref 70–99)
Glucose-Capillary: 120 mg/dL — ABNORMAL HIGH (ref 70–99)
Glucose-Capillary: 119 mg/dL — ABNORMAL HIGH (ref 70–99)

## 2022-09-26 MED ORDER — ACETAMINOPHEN 325 MG PO TABS
650.0000 mg | ORAL_TABLET | ORAL | Status: DC | PRN
  Administered 2022-09-26: 650 mg via ORAL
  Filled 2022-09-26: qty 2

## 2022-09-26 MED ORDER — HEPARIN SODIUM (PORCINE) 5000 UNIT/ML IJ SOLN: 5000.0000 [IU] | Freq: Three times a day (TID) | INTRAMUSCULAR | Status: DC

## 2022-09-26 NOTE — Anesthesia Preprocedure Evaluation (Signed)
Anesthesia Evaluation  Patient identified by MRN, date of birth, ID band Patient awake    Reviewed: Allergy & Precautions, NPO status , Patient's Chart, lab work & pertinent test results  Airway Mallampati: II  TM Distance: >3 FB Neck ROM: Full    Dental  (+) Teeth Intact, Dental Advisory Given   Pulmonary Current Smoker   Pulmonary exam normal breath sounds clear to auscultation       Cardiovascular hypertension, Normal cardiovascular exam Rhythm:Regular Rate:Normal     Neuro/Psych  Headaches PSYCHIATRIC DISORDERS Anxiety Depression     Neuromuscular disease    GI/Hepatic ,GERD  ,,(+)     substance abuse  alcohol useBiliary obstruction   Endo/Other  negative endocrine ROS  Obesity   Renal/GU negative Renal ROS     Musculoskeletal  (+) Arthritis ,    Abdominal   Peds  Hematology negative hematology ROS (+)   Anesthesia Other Findings   Reproductive/Obstetrics                             Anesthesia Physical Anesthesia Plan  ASA: 3  Anesthesia Plan: General   Post-op Pain Management: Minimal or no pain anticipated   Induction: Intravenous  PONV Risk Score and Plan: 2 and Dexamethasone and Ondansetron  Airway Management Planned: Oral ETT  Additional Equipment:   Intra-op Plan:   Post-operative Plan: Extubation in OR  Informed Consent: I have reviewed the patients History and Physical, chart, labs and discussed the procedure including the risks, benefits and alternatives for the proposed anesthesia with the patient or authorized representative who has indicated his/her understanding and acceptance.     Dental advisory given  Plan Discussed with: CRNA  Anesthesia Plan Comments:        Anesthesia Quick Evaluation

## 2022-09-26 NOTE — Progress Notes (Signed)
PROGRESS NOTE    [name]  QMV:[id number] DOB: 05-13-1945 DOA: 09/25/2022 PCP: Patient, No Pcp Per    Brief Narrative:  [name] Seguin is a 77 y.o. female with medical history significant of HTN, HLD, anxiety/depression, GERD with barrett's esophagus, recent diagnosis of pancreatic cancer with CBD obstruction now with presenting progression of intra biliary  obstruction with worsening obstructive labs. Patient was seen as out patient however due to no available slots for out patient ERCP patient was admitted to expedite care.    Assessment and Plan: Biliary Obstruction  -elevated lfts/bili -NPO midnight  - for ERCP and biliary stenting at 1:30PM  -patient no fever/ no chills / no n/v/d or abdominal pain     Adenocarcinoma of Pancreas  disease localized to the pancreas head based  Tx would be surgery awaiting f/u with tumor board  Unclear at this time if patient is a good candidate as she has significant dementia  -followed by DR Truett Perna  (added him to treatment team so he is aware)   Hyponatremia  -due to volume status and use of hydrochlorothiazide Resolved with IVF   Hypertension  -continue amlodipine -hold hydrochlorothiazide due to hyponatremia   ETOH abuse  -in remission    GERD -ppi   Hx of angioedema due to Ace  -09/09/2022   Dementia -resume home regimen once able to take PO   Hypomagnesemia  -replete d   Hyperglycemia -A1c  -monitor finger sticks    DVT prophylaxis: heparin injection 5,000 Units Start: 09/25/22 2200    Code Status: Full Code   Disposition Plan:  Level of care: Telemetry Status is: Inpatient Remains inpatient appropriate    Consultants:  GI   Subjective: No complaints   Objective: Vitals:   09/25/22 2200 09/26/22 0145 09/26/22 0432 09/26/22 0500  BP: 137/88 135/85 (!) 145/71   Pulse: 73 75 75   Resp: 20  15   Temp: 97.7 F (36.5 C) 98.3 F (36.8 C) 98.3 F (36.8 C)   TempSrc: Oral Oral    SpO2: 94% 98% 92%    Weight:    93.5 kg  Height:        Intake/Output Summary (Last 24 hours) at 09/26/2022 0809 Last data filed at 09/26/2022 0600 Gross per 24 hour  Intake 768.33 ml  Output --  Net 768.33 ml   Filed Weights   09/25/22 1814 09/26/22 0500  Weight: 95.6 kg 93.5 kg    Examination:   General: Appearance:    Obese female in no acute distress     Lungs:     respirations unlabored  Heart:    Normal heart rate. N.    MS:   All extremities are intact.    Neurologic:   Awake, alert, pleasant and cooperative        Data Reviewed: I have personally reviewed following labs and imaging studies  CBC: Recent Labs  Lab 09/25/22 1032 09/25/22 1927 09/26/22 0535  WBC 9.6 10.2 8.0  NEUTROABS 6.3 5.4  --   HGB 13.8 12.6 12.2  HCT 42.7 39.5 37.6  MCV 96.0 96.3 94.7  PLT 232 211 192   Basic Metabolic Panel: Recent Labs  Lab 09/25/22 1032 09/25/22 1927 09/26/22 0535  NA 130* 133* 136  K 4.1 3.7 3.5  CL 96* 101 103  CO2 24 22 21*  GLUCOSE 237* 181* 157*  BUN 10 13 11   CREATININE 0.99 1.04* 0.67  CALCIUM 9.9 8.9 9.0  MG  --  1.6*  --  PHOS  --  4.0  --    GFR: Estimated Creatinine Clearance: 65.5 mL/min (by C-G formula based on SCr of 0.67 mg/dL). Liver Function Tests: Recent Labs  Lab 09/25/22 1032 09/25/22 1927 09/26/22 0535  AST 62* 64* 61*  ALT 131* 117* 107*  ALKPHOS 384* 333* 314*  BILITOT 3.6* 3.2* 4.6*  PROT 7.8 7.2 6.9  ALBUMIN 4.6 3.7 3.5   No results for input(s): "LIPASE", "AMYLASE" in the last 168 hours. No results for input(s): "AMMONIA" in the last 168 hours. Coagulation Profile: Recent Labs  Lab 09/25/22 1032  INR 1.0   Cardiac Enzymes: No results for input(s): "CKTOTAL", "CKMB", "CKMBINDEX", "TROPONINI" in the last 168 hours. BNP (last 3 results) No results for input(s): "PROBNP" in the last 8760 hours. HbA1C: No results for input(s): "HGBA1C" in the last 72 hours. CBG: No results for input(s): "GLUCAP" in the last 168 hours. Lipid  Profile: No results for input(s): "CHOL", "HDL", "LDLCALC", "TRIG", "CHOLHDL", "LDLDIRECT" in the last 72 hours. Thyroid Function Tests: No results for input(s): "TSH", "T4TOTAL", "FREET4", "T3FREE", "THYROIDAB" in the last 72 hours. Anemia Panel: No results for input(s): "VITAMINB12", "FOLATE", "FERRITIN", "TIBC", "IRON", "RETICCTPCT" in the last 72 hours. Sepsis Labs: No results for input(s): "PROCALCITON", "LATICACIDVEN" in the last 168 hours.  No results found for this or any previous visit (from the past 240 hour(s)).       Radiology Studies: No results found.      Scheduled Meds:  amLODipine  10 mg Oral Daily   heparin  5,000 Units Subcutaneous Q8H   simvastatin  40 mg Oral Daily   Continuous Infusions:  sodium chloride 75 mL/hr at 09/26/22 0600     LOS: 1 day    Time spent: 45 minutes spent on chart review, discussion with nursing staff, consultants, updating family and interview/physical exam; more than 50% of that time was spent in counseling and/or coordination of care.    Joseph Art, DO [facility] Available via Epic secure chat 7am-7pm After these hours, please refer to coverage provider listed on [email or web address] 09/26/2022, 8:09 AM

## 2022-09-26 NOTE — Plan of Care (Signed)
  Problem: Health Behavior/Discharge Planning: Goal: Ability to manage health-related needs will improve Outcome: Progressing   Problem: Clinical Measurements: Goal: Ability to maintain clinical measurements within normal limits will improve Outcome: Progressing   Problem: Activity: Goal: Risk for activity intolerance will decrease Outcome: Progressing   Problem: Elimination: Goal: Will not experience complications related to bowel motility Outcome: Progressing Goal: Will not experience complications related to urinary retention Outcome: Progressing   Problem: Safety: Goal: Ability to remain free from injury will improve Outcome: Progressing   Problem: Skin Integrity: Goal: Risk for impaired skin integrity will decrease Outcome: Progressing

## 2022-09-26 NOTE — TOC CM/SW Note (Signed)
Transition of Care [facility]) - Inpatient Brief Assessment   Patient Details  Name: [name] MRN: [id number] Date of Birth: April 17, 1945  [facility]) CM/SW Contact:    Durenda Guthrie, RN Phone Number: 09/26/2022, 12:27 PM   Clinical Narrative:    Transition of Care Asessment: Insurance and Status: Insurance coverage has been reviewed Patient has primary care physician: Yes (sees MD at facility) Home environment has been reviewed: [facility] Prior level of function:: independent Prior/Current Home Services: No current home services Social Determinants of Health Reivew: SDOH reviewed no interventions necessary Readmission risk has been reviewed: Yes Transition of care needs: no transition of care needs at this time

## 2022-09-26 NOTE — H&P (View-Only) (Signed)
Consultation  Referring Provider: [facility] Primary Care Physician:  Patient, No Pcp Per Primary Gastroenterologist:  Dr.Mansouraty  Reason for Consultation:  Jaundice  HPI: [name] is a 77 y.o. female known recently to Dr. Meridee Score with new diagnosis of pancreatic adenocarcinoma.  She underwent EUS on 09/11/2022 with finding of erythematous duodenopathy in the duodenum, a subepithelial nodule was found in the duodenum adjacent to the major papilla a, on EUS masslike area identified in the pancreatic head, cytology was done.  Stage T2 N0 MX by endosonographic criteria.  Fine-needle biopsy done.  There was dilation of the common bile duct and the common hepatic duct no cholelithiasis or choledocholithiasis, there is 1 low enlarged lymph node in the porta hepatis. Path confirmed adenocarcinoma At that time she had T. bili of 1.2/AST 53/ALT 102 and alk phos of 251 She was seen by Dr. Truett Perna in his office on 09/19/2022.  Case is to be presented at tumor board, patient has significant dementia which may preclude surgery and options will be observation, chemotherapy and radiation if no surgery. He had labs done yesterday which shows sodium 130/potassium 4.1 BUN 10/creatinine 0.99 T. bili 3.6/AST 62/ALT 131/alk phos 384 WBC 9.6/hemoglobin 13.8/hematocrit 42.7 INR 1.0  Arrangements were made for admission to expedite ERCP and biliary stent placement.  Patient currently has no complaints other than itchiness.  She was not aware that she had become jaundiced but on questioning had noticed dark urine over the past few days.  She has no complaints of abdominal pain, says her appetite has been okay no nausea or vomiting, no fever or chills.  He is a resident in an assisted living facility, other comorbidities include hypertension, anxiety/depression, prior history of heavy EtOH, Barrett's esophagus, and dementia.   Past Medical History:  Diagnosis Date   ALCOHOL ABUSE, HX OF    ANEMIA-NOS     ANXIETY    CERVICAL RADICULOPATHY, LEFT    5 BACK OPERATIONS AND 2 NECK FUSIONS   DEGENERATIVE DISC DISEASE, CERVICAL SPINE    DEGENERATIVE DISC DISEASE, CERVICAL SPINE, W/RADICULOPATHY    Depressive disorder, not elsewhere classified    Depressive type psychosis    DIVERTICULITIS, HX OF    CLEARED ACCORDING TO PATIENT   DIZZINESS    GERD    HYPERLIPIDEMIA    HYPERTENSION    Insomnia, unspecified    LOW BACK PAIN    RUPTURED DISC   Memory loss    MIGRAINE HEADACHE 09/06/2017   NOT CURRENTLY   Palpitation    NOT A PROBLEM PER PATIENT   WEIGHT LOSS     Past Surgical History:  Procedure Laterality Date   BACK SURGERY  2008   X 5.Used her bone out of hips.   BIOPSY  09/11/2022   Procedure: BIOPSY;  Surgeon: Meridee Score Netty Starring., MD;  Location: [facility];  Service: Gastroenterology;;   CERVICAL FUSION     X 2, [place], [place], [place]   DILATION AND CURETTAGE OF UTERUS     ESOPHAGOGASTRODUODENOSCOPY N/A 01/03/2015   Procedure: ESOPHAGOGASTRODUODENOSCOPY (EGD);  Surgeon: Elnita Maxwell, MD;  Location: [facility];  Service: Endoscopy;  Laterality: N/A;   ESOPHAGOGASTRODUODENOSCOPY N/A 09/11/2022   Procedure: ESOPHAGOGASTRODUODENOSCOPY (EGD);  Surgeon: Lemar Lofty., MD;  Location: [facility];  Service: Gastroenterology;  Laterality: N/A;   EYE SURGERY Bilateral    Cataract Extraction with IOL   FINE NEEDLE ASPIRATION N/A 09/11/2022   Procedure: FINE NEEDLE ASPIRATION (FNA) LINEAR;  Surgeon: Lemar Lofty., MD;  Location: [facility];  Service: Gastroenterology;  Laterality: N/A;   HYSTEROSCOPY WITH D & C N/A 11/05/2016   Procedure: DILATATION AND CURETTAGE /HYSTEROSCOPY/MYOSURE RESECTION OF ENDOMETRIAL POLYP;  Surgeon: Schermerhorn, Ihor Austin, MD;  Location: [facility];  Service: Gynecology;  Laterality: N/A;   LUMBAR FUSION     TONSILLECTOMY     TOTAL HIP ARTHROPLASTY Right 08/04/2015   Procedure: TOTAL HIP ARTHROPLASTY ANTERIOR APPROACH;   Surgeon: Kennedy Bucker, MD;  Location: [facility];  Service: Orthopedics;  Laterality: Right;   TOTAL HIP ARTHROPLASTY Left 09/10/2017   Procedure: TOTAL HIP ARTHROPLASTY ANTERIOR APPROACH;  Surgeon: Kennedy Bucker, MD;  Location: [facility];  Service: Orthopedics;  Laterality: Left;   UPPER ESOPHAGEAL ENDOSCOPIC ULTRASOUND (EUS) N/A 09/11/2022   Procedure: UPPER ESOPHAGEAL ENDOSCOPIC ULTRASOUND (EUS);  Surgeon: Lemar Lofty., MD;  Location: [facility];  Service: Gastroenterology;  Laterality: N/A;    Prior to Admission medications   Medication Sig Start Date End Date Taking? Authorizing Provider  acetaminophen (TYLENOL) 500 MG tablet Take 500 mg by mouth every 8 (eight) hours as needed for moderate pain.   Yes [provider]  amLODipine (NORVASC) 10 MG tablet Take 1 tablet (10 mg total) by mouth daily. 09/12/22 10/12/22 Yes Dahal, Melina Schools, MD  buPROPion (WELLBUTRIN XL) 300 MG 24 hr tablet Take 300 mg by mouth daily.   Yes [provider]  busPIRone (BUSPAR) 5 MG tablet Take 5 mg by mouth 2 (two) times daily.   Yes [provider]  dicyclomine (BENTYL) 20 MG tablet Take 20 mg by mouth 3 (three) times daily before meals.   Yes [provider]  hydrochlorothiazide (HYDRODIURIL) 12.5 MG tablet Take 1 tablet (12.5 mg total) by mouth daily. 09/13/22  Yes Dahal, Melina Schools, MD  loratadine (CLARITIN) 10 MG tablet Take 10 mg by mouth daily.   Yes [provider]  omeprazole (PRILOSEC) 20 MG capsule Take 20 mg by mouth 2 (two) times daily before a meal.   Yes [provider]  pregabalin (LYRICA) 100 MG capsule Take 100 mg by mouth 3 (three) times daily.   Yes [provider]  senna (SENOKOT) 8.6 MG TABS tablet Take 1 tablet by mouth daily.   Yes [provider]  bismuth subsalicylate (PEPTO BISMOL) 262 MG/15ML suspension Take 30 mLs by mouth every 6 (six) hours as needed for indigestion or diarrhea or loose stools. Patient not taking:  Reported on 09/19/2022    [provider]  donepezil (ARICEPT) 10 MG tablet Take 10 mg by mouth at bedtime.     [provider]  Eszopiclone (ESZOPICLONE) 3 MG TABS Take 3 mg by mouth at bedtime. Take immediately before bedtime    [provider]  guaifenesin (ROBITUSSIN) 100 MG/5ML syrup Take 10 mLs by mouth every 6 (six) hours as needed for cough. Not to exceed 4 doses in 24 hours    [provider]  ondansetron (ZOFRAN-ODT) 8 MG disintegrating tablet Take 8 mg by mouth every 6 (six) hours as needed for nausea or vomiting. Patient not taking: Reported on 09/19/2022    [provider]  sertraline (ZOLOFT) 50 MG tablet Take 50 mg by mouth every evening.    [provider]  simvastatin (ZOCOR) 40 MG tablet Take 40 mg by mouth daily.    [provider]  SYSTANE BALANCE 0.6 % SOLN Apply 1 drop to eye 3 (three) times daily. 06/25/22   [provider]  traZODone (DESYREL) 50 MG tablet Take 50 mg by  mouth at bedtime.    [provider]    Current Facility-Administered Medications  Medication Dose Route Frequency Provider Last Rate Last Admin   0.9 %  sodium chloride infusion   Intravenous Continuous Skip Mayer A, MD 75 mL/hr at 09/26/22 0600 Infusion Verify at 09/26/22 0600   albuterol (PROVENTIL) (2.5 MG/3ML) 0.083% nebulizer solution 2.5 mg  2.5 mg Nebulization Q2H PRN Lurline Del, MD       amLODipine (NORVASC) tablet 10 mg  10 mg Oral Daily Lurline Del, MD       [START ON 09/27/2022] heparin injection 5,000 Units  5,000 Units Subcutaneous Q8H Vann, Jessica U, DO       hydrOXYzine (ATARAX) tablet 25 mg  25 mg Oral TID PRN Lurline Del, MD   25 mg at 09/25/22 2232   ondansetron (ZOFRAN) tablet 4 mg  4 mg Oral Q6H PRN Lurline Del, MD       Or   ondansetron [facility]) injection 4 mg  4 mg Intravenous Q6H PRN Lurline Del, MD       simvastatin (ZOCOR) tablet 40 mg  40 mg Oral Daily  Lurline Del, MD        Allergies as of 09/25/2022 - Review Complete 09/19/2022  Allergen Reaction Noted   Codeine Itching 09/19/2006   Atorvastatin Other (See Comments) 09/19/2006   Lisinopril Other (See Comments) 09/19/2022   Aspirin Other (See Comments) 11/29/2015   Morphine Other (See Comments) 09/19/2006   Nsaids  09/06/2017    Family History  Problem Relation Age of Onset   Alcohol abuse Father    Depression Other    Alcohol abuse Other     Social History   Socioeconomic History   Marital status: Widowed    Spouse name: Not on file   Number of children: Not on file   Years of education: Not on file   Highest education level: Not on file  Occupational History   Not on file  Tobacco Use   Smoking status: Every Day    Current packs/day: 0.50    Types: Cigarettes   Smokeless tobacco: Never  Vaping Use   Vaping status: Former  Substance and Sexual Activity   Alcohol use: No   Drug use: No   Sexual activity: Never  Other Topics Concern   Not on file  Social History Narrative   Not on file   Social Determinants of Health   Financial Resource Strain: Not on file  Food Insecurity: No Food Insecurity (09/25/2022)   Hunger Vital Sign    Worried About Running Out of Food in the Last Year: Never true    Ran Out of Food in the Last Year: Never true  Transportation Needs: No Transportation Needs (09/25/2022)   PRAPARE - Administrator, Civil Service (Medical): No    Lack of Transportation (Non-Medical): No  Physical Activity: Not on file  Stress: Not on file  Social Connections: Not on file  Intimate Partner Violence: Not At Risk (09/25/2022)   Humiliation, Afraid, Rape, and Kick questionnaire    Fear of Current or Ex-Partner: No    Emotionally Abused: No    Physically Abused: No    Sexually Abused: No    Review of Systems: Pertinent positive and negative review of systems were noted in the above HPI section.  All other review of systems was  otherwise negative.   Physical Exam: Vital signs in last 24 hours: Temp:  [97.5 F (  36.4 C)-98.3 F (36.8 C)] 98.3 F (36.8 C) (07/31 0432) Pulse Rate:  [73-93] 75 (07/31 0432) Resp:  [15-20] 15 (07/31 0432) BP: (135-145)/(71-88) 145/71 (07/31 0432) SpO2:  [92 %-98 %] 92 % (07/31 0432) Weight:  [93.5 kg-95.6 kg] 93.5 kg (07/31 0500) Last BM Date : 09/25/22 General:   Alert,  Well-developed, well-nourished elderly white female, pleasant and cooperative in NAD Head:  Normocephalic and atraumatic. Eyes:  Sclera icteric conjunctiva pink. Ears:  Normal auditory acuity. Nose:  No deformity, discharge,  or lesions. Mouth:  No deformity or lesions.   Neck:  Supple; no masses or thyromegaly. Lungs:  Clear throughout to auscultation.   No wheezes, crackles, or rhonchi. Heart:  Regular rate and rhythm; no murmurs, clicks, rubs,  or gallops. Abdomen:  Soft, obese, nontender, BS active,nonpalp mass or hsm.   Rectal: Not done Msk:  Symmetrical without gross deformities. . Pulses:  Normal pulses noted. Extremities:  Without clubbing or edema. Neurologic:  Alert and  oriented x4;  grossly normal neurologically. Skin:  Intact without significant lesions or rashes..  Skin is jaundiced Psych:  Alert and cooperative. Normal mood and affect.  Intake/Output from previous day: 07/30 0701 - 07/31 0700 In: 768.3 [I.V.:768.3] Out: -  Intake/Output this shift: No intake/output data recorded.  Lab Results: Recent Labs    09/25/22 1032 09/25/22 1927 09/26/22 0535  WBC 9.6 10.2 8.0  HGB 13.8 12.6 12.2  HCT 42.7 39.5 37.6  PLT 232 211 192   BMET Recent Labs    09/25/22 1032 09/25/22 1927 09/26/22 0535  NA 130* 133* 136  K 4.1 3.7 3.5  CL 96* 101 103  CO2 24 22 21*  GLUCOSE 237* 181* 157*  BUN 10 13 11   CREATININE 0.99 1.04* 0.67  CALCIUM 9.9 8.9 9.0   LFT Recent Labs    09/26/22 0535  PROT 6.9  ALBUMIN 3.5  AST 61*  ALT 107*  ALKPHOS 314*  BILITOT 4.6*   PT/INR Recent  Labs    09/25/22 1032  LABPROT 13.6  INR 1.0   Hepatitis Panel No results for input(s): "HEPBSAG", "HCVAB", "HEPAIGM", "HEPBIGM" in the last 72 hours.     IMPRESSION:  #45 77 year old white female with new diagnosis of pancreatic adenocarcinoma made at the time of EUS/biopsy on 09/11/2022. Patient has developed jaundice over the 2 weeks since that time and presents now with T. bili of 3.6/up to 4.6 today.  She is asymptomatic  Jaundice/elevated LFTs and hyperbilirubinemia secondary to progressive common bile duct obstruction due to the known pancreatic head adenocarcinoma.  #2 mild hyponatremia 3.  Dementia 4.  Hypertension  Plan; patient has been kept n.p.o. She is tentatively scheduled for ERCP with biliary stent placement with Dr. Meridee Score  at noon today, if timing/anesthesia esthesia allow.  If procedure is unable to be done at this time today then will be rescheduled to 7:30 AM tomorrow with Dr. Meridee Score. I discussed the procedure in detail with the patient, will also call her daughter who is her power of attorney to discuss in detail and obtain consent.   I spoke with patient's daughter [name] by phone, and reviewed in detail indication for ERCP and stent placement, reviewed procedure in detail, including indications risk and benefits.  She will either call up to the nurses station to give consent or come in person within the next hour.  She is aware that the procedure may get bumped until tomorrow morning.   Laurie Kinnison EsterwoodPA-C  09/26/2022, 8:36 AM

## 2022-09-26 NOTE — Plan of Care (Signed)
  Problem: Education: Goal: Knowledge of General Education information will improve Description: Including pain rating scale, medication(s)/side effects and non-pharmacologic comfort measures Outcome: Progressing   Problem: Clinical Measurements: Goal: Will remain free from infection Outcome: Progressing   Problem: Activity: Goal: Risk for activity intolerance will decrease Outcome: Progressing   Problem: Nutrition: Goal: Adequate nutrition will be maintained Outcome: Progressing   Problem: Coping: Goal: Level of anxiety will decrease Outcome: Progressing   Problem: Elimination: Goal: Will not experience complications related to bowel motility Outcome: Progressing   Problem: Skin Integrity: Goal: Risk for impaired skin integrity will decrease Outcome: Progressing

## 2022-09-26 NOTE — Progress Notes (Signed)
The proposed treatment discussed in conference is for discussion purpose only and is not a binding recommendation.  The patients have not been physically examined, or presented with their treatment options.  Therefore, final treatment plans cannot be decided.  

## 2022-09-26 NOTE — Consult Note (Addendum)
Consultation  Referring Provider: [facility] Primary Care Physician:  Patient, No Pcp Per Primary Gastroenterologist:  Dr.Mansouraty  Reason for Consultation:  Jaundice  HPI: [name] is a 77 y.o. female known recently to Dr. Meridee Score with new diagnosis of pancreatic adenocarcinoma.  She underwent EUS on 09/11/2022 with finding of erythematous duodenopathy in the duodenum, a subepithelial nodule was found in the duodenum adjacent to the major papilla a, on EUS masslike area identified in the pancreatic head, cytology was done.  Stage T2 N0 MX by endosonographic criteria.  Fine-needle biopsy done.  There was dilation of the common bile duct and the common hepatic duct no cholelithiasis or choledocholithiasis, there is 1 low enlarged lymph node in the porta hepatis. Path confirmed adenocarcinoma At that time she had T. bili of 1.2/AST 53/ALT 102 and alk phos of 251 She was seen by Dr. Truett Perna in his office on 09/19/2022.  Case is to be presented at tumor board, patient has significant dementia which may preclude surgery and options will be observation, chemotherapy and radiation if no surgery. He had labs done yesterday which shows sodium 130/potassium 4.1 BUN 10/creatinine 0.99 T. bili 3.6/AST 62/ALT 131/alk phos 384 WBC 9.6/hemoglobin 13.8/hematocrit 42.7 INR 1.0  Arrangements were made for admission to expedite ERCP and biliary stent placement.  Patient currently has no complaints other than itchiness.  She was not aware that she had become jaundiced but on questioning had noticed dark urine over the past few days.  She has no complaints of abdominal pain, says her appetite has been okay no nausea or vomiting, no fever or chills.  He is a resident in an assisted living facility, other comorbidities include hypertension, anxiety/depression, prior history of heavy EtOH, Barrett's esophagus, and dementia.   Past Medical History:  Diagnosis Date   ALCOHOL ABUSE, HX OF    ANEMIA-NOS     ANXIETY    CERVICAL RADICULOPATHY, LEFT    5 BACK OPERATIONS AND 2 NECK FUSIONS   DEGENERATIVE DISC DISEASE, CERVICAL SPINE    DEGENERATIVE DISC DISEASE, CERVICAL SPINE, W/RADICULOPATHY    Depressive disorder, not elsewhere classified    Depressive type psychosis    DIVERTICULITIS, HX OF    CLEARED ACCORDING TO PATIENT   DIZZINESS    GERD    HYPERLIPIDEMIA    HYPERTENSION    Insomnia, unspecified    LOW BACK PAIN    RUPTURED DISC   Memory loss    MIGRAINE HEADACHE 09/06/2017   NOT CURRENTLY   Palpitation    NOT A PROBLEM PER PATIENT   WEIGHT LOSS     Past Surgical History:  Procedure Laterality Date   BACK SURGERY  2008   X 5.Used her bone out of hips.   BIOPSY  09/11/2022   Procedure: BIOPSY;  Surgeon: Meridee Score Netty Starring., MD;  Location: [facility];  Service: Gastroenterology;;   CERVICAL FUSION     X 2, [place], [place], [place]   DILATION AND CURETTAGE OF UTERUS     ESOPHAGOGASTRODUODENOSCOPY N/A 01/03/2015   Procedure: ESOPHAGOGASTRODUODENOSCOPY (EGD);  Surgeon: Elnita Maxwell, MD;  Location: [facility];  Service: Endoscopy;  Laterality: N/A;   ESOPHAGOGASTRODUODENOSCOPY N/A 09/11/2022   Procedure: ESOPHAGOGASTRODUODENOSCOPY (EGD);  Surgeon: Lemar Lofty., MD;  Location: [facility];  Service: Gastroenterology;  Laterality: N/A;   EYE SURGERY Bilateral    Cataract Extraction with IOL   FINE NEEDLE ASPIRATION N/A 09/11/2022   Procedure: FINE NEEDLE ASPIRATION (FNA) LINEAR;  Surgeon: Lemar Lofty., MD;  Location: [facility];  Service: Gastroenterology;  Laterality: N/A;   HYSTEROSCOPY WITH D & C N/A 11/05/2016   Procedure: DILATATION AND CURETTAGE /HYSTEROSCOPY/MYOSURE RESECTION OF ENDOMETRIAL POLYP;  Surgeon: Schermerhorn, Ihor Austin, MD;  Location: [facility];  Service: Gynecology;  Laterality: N/A;   LUMBAR FUSION     TONSILLECTOMY     TOTAL HIP ARTHROPLASTY Right 08/04/2015   Procedure: TOTAL HIP ARTHROPLASTY ANTERIOR APPROACH;   Surgeon: Kennedy Bucker, MD;  Location: [facility];  Service: Orthopedics;  Laterality: Right;   TOTAL HIP ARTHROPLASTY Left 09/10/2017   Procedure: TOTAL HIP ARTHROPLASTY ANTERIOR APPROACH;  Surgeon: Kennedy Bucker, MD;  Location: [facility];  Service: Orthopedics;  Laterality: Left;   UPPER ESOPHAGEAL ENDOSCOPIC ULTRASOUND (EUS) N/A 09/11/2022   Procedure: UPPER ESOPHAGEAL ENDOSCOPIC ULTRASOUND (EUS);  Surgeon: Lemar Lofty., MD;  Location: [facility];  Service: Gastroenterology;  Laterality: N/A;    Prior to Admission medications   Medication Sig Start Date End Date Taking? Authorizing Provider  acetaminophen (TYLENOL) 500 MG tablet Take 500 mg by mouth every 8 (eight) hours as needed for moderate pain.   Yes [provider]  amLODipine (NORVASC) 10 MG tablet Take 1 tablet (10 mg total) by mouth daily. 09/12/22 10/12/22 Yes Dahal, Melina Schools, MD  buPROPion (WELLBUTRIN XL) 300 MG 24 hr tablet Take 300 mg by mouth daily.   Yes [provider]  busPIRone (BUSPAR) 5 MG tablet Take 5 mg by mouth 2 (two) times daily.   Yes [provider]  dicyclomine (BENTYL) 20 MG tablet Take 20 mg by mouth 3 (three) times daily before meals.   Yes [provider]  hydrochlorothiazide (HYDRODIURIL) 12.5 MG tablet Take 1 tablet (12.5 mg total) by mouth daily. 09/13/22  Yes Dahal, Melina Schools, MD  loratadine (CLARITIN) 10 MG tablet Take 10 mg by mouth daily.   Yes [provider]  omeprazole (PRILOSEC) 20 MG capsule Take 20 mg by mouth 2 (two) times daily before a meal.   Yes [provider]  pregabalin (LYRICA) 100 MG capsule Take 100 mg by mouth 3 (three) times daily.   Yes [provider]  senna (SENOKOT) 8.6 MG TABS tablet Take 1 tablet by mouth daily.   Yes [provider]  bismuth subsalicylate (PEPTO BISMOL) 262 MG/15ML suspension Take 30 mLs by mouth every 6 (six) hours as needed for indigestion or diarrhea or loose stools. Patient not taking:  Reported on 09/19/2022    [provider]  donepezil (ARICEPT) 10 MG tablet Take 10 mg by mouth at bedtime.     [provider]  Eszopiclone (ESZOPICLONE) 3 MG TABS Take 3 mg by mouth at bedtime. Take immediately before bedtime    [provider]  guaifenesin (ROBITUSSIN) 100 MG/5ML syrup Take 10 mLs by mouth every 6 (six) hours as needed for cough. Not to exceed 4 doses in 24 hours    [provider]  ondansetron (ZOFRAN-ODT) 8 MG disintegrating tablet Take 8 mg by mouth every 6 (six) hours as needed for nausea or vomiting. Patient not taking: Reported on 09/19/2022    [provider]  sertraline (ZOLOFT) 50 MG tablet Take 50 mg by mouth every evening.    [provider]  simvastatin (ZOCOR) 40 MG tablet Take 40 mg by mouth daily.    [provider]  SYSTANE BALANCE 0.6 % SOLN Apply 1 drop to eye 3 (three) times daily. 06/25/22   [provider]  traZODone (DESYREL) 50 MG tablet Take 50 mg by  mouth at bedtime.    [provider]    Current Facility-Administered Medications  Medication Dose Route Frequency Provider Last Rate Last Admin   0.9 %  sodium chloride infusion   Intravenous Continuous Skip Mayer A, MD 75 mL/hr at 09/26/22 0600 Infusion Verify at 09/26/22 0600   albuterol (PROVENTIL) (2.5 MG/3ML) 0.083% nebulizer solution 2.5 mg  2.5 mg Nebulization Q2H PRN Lurline Del, MD       amLODipine (NORVASC) tablet 10 mg  10 mg Oral Daily Lurline Del, MD       [START ON 09/27/2022] heparin injection 5,000 Units  5,000 Units Subcutaneous Q8H Vann, Jessica U, DO       hydrOXYzine (ATARAX) tablet 25 mg  25 mg Oral TID PRN Lurline Del, MD   25 mg at 09/25/22 2232   ondansetron (ZOFRAN) tablet 4 mg  4 mg Oral Q6H PRN Lurline Del, MD       Or   ondansetron [facility]) injection 4 mg  4 mg Intravenous Q6H PRN Lurline Del, MD       simvastatin (ZOCOR) tablet 40 mg  40 mg Oral Daily  Lurline Del, MD        Allergies as of 09/25/2022 - Review Complete 09/19/2022  Allergen Reaction Noted   Codeine Itching 09/19/2006   Atorvastatin Other (See Comments) 09/19/2006   Lisinopril Other (See Comments) 09/19/2022   Aspirin Other (See Comments) 11/29/2015   Morphine Other (See Comments) 09/19/2006   Nsaids  09/06/2017    Family History  Problem Relation Age of Onset   Alcohol abuse Father    Depression Other    Alcohol abuse Other     Social History   Socioeconomic History   Marital status: Widowed    Spouse name: Not on file   Number of children: Not on file   Years of education: Not on file   Highest education level: Not on file  Occupational History   Not on file  Tobacco Use   Smoking status: Every Day    Current packs/day: 0.50    Types: Cigarettes   Smokeless tobacco: Never  Vaping Use   Vaping status: Former  Substance and Sexual Activity   Alcohol use: No   Drug use: No   Sexual activity: Never  Other Topics Concern   Not on file  Social History Narrative   Not on file   Social Determinants of Health   Financial Resource Strain: Not on file  Food Insecurity: No Food Insecurity (09/25/2022)   Hunger Vital Sign    Worried About Running Out of Food in the Last Year: Never true    Ran Out of Food in the Last Year: Never true  Transportation Needs: No Transportation Needs (09/25/2022)   PRAPARE - Administrator, Civil Service (Medical): No    Lack of Transportation (Non-Medical): No  Physical Activity: Not on file  Stress: Not on file  Social Connections: Not on file  Intimate Partner Violence: Not At Risk (09/25/2022)   Humiliation, Afraid, Rape, and Kick questionnaire    Fear of Current or Ex-Partner: No    Emotionally Abused: No    Physically Abused: No    Sexually Abused: No    Review of Systems: Pertinent positive and negative review of systems were noted in the above HPI section.  All other review of systems was  otherwise negative.   Physical Exam: Vital signs in last 24 hours: Temp:  [97.5 F (  36.4 C)-98.3 F (36.8 C)] 98.3 F (36.8 C) (07/31 0432) Pulse Rate:  [73-93] 75 (07/31 0432) Resp:  [15-20] 15 (07/31 0432) BP: (135-145)/(71-88) 145/71 (07/31 0432) SpO2:  [92 %-98 %] 92 % (07/31 0432) Weight:  [93.5 kg-95.6 kg] 93.5 kg (07/31 0500) Last BM Date : 09/25/22 General:   Alert,  Well-developed, well-nourished elderly white female, pleasant and cooperative in NAD Head:  Normocephalic and atraumatic. Eyes:  Sclera icteric conjunctiva pink. Ears:  Normal auditory acuity. Nose:  No deformity, discharge,  or lesions. Mouth:  No deformity or lesions.   Neck:  Supple; no masses or thyromegaly. Lungs:  Clear throughout to auscultation.   No wheezes, crackles, or rhonchi. Heart:  Regular rate and rhythm; no murmurs, clicks, rubs,  or gallops. Abdomen:  Soft, obese, nontender, BS active,nonpalp mass or hsm.   Rectal: Not done Msk:  Symmetrical without gross deformities. . Pulses:  Normal pulses noted. Extremities:  Without clubbing or edema. Neurologic:  Alert and  oriented x4;  grossly normal neurologically. Skin:  Intact without significant lesions or rashes..  Skin is jaundiced Psych:  Alert and cooperative. Normal mood and affect.  Intake/Output from previous day: 07/30 0701 - 07/31 0700 In: 768.3 [I.V.:768.3] Out: -  Intake/Output this shift: No intake/output data recorded.  Lab Results: Recent Labs    09/25/22 1032 09/25/22 1927 09/26/22 0535  WBC 9.6 10.2 8.0  HGB 13.8 12.6 12.2  HCT 42.7 39.5 37.6  PLT 232 211 192   BMET Recent Labs    09/25/22 1032 09/25/22 1927 09/26/22 0535  NA 130* 133* 136  K 4.1 3.7 3.5  CL 96* 101 103  CO2 24 22 21*  GLUCOSE 237* 181* 157*  BUN 10 13 11   CREATININE 0.99 1.04* 0.67  CALCIUM 9.9 8.9 9.0   LFT Recent Labs    09/26/22 0535  PROT 6.9  ALBUMIN 3.5  AST 61*  ALT 107*  ALKPHOS 314*  BILITOT 4.6*   PT/INR Recent  Labs    09/25/22 1032  LABPROT 13.6  INR 1.0   Hepatitis Panel No results for input(s): "HEPBSAG", "HCVAB", "HEPAIGM", "HEPBIGM" in the last 72 hours.     IMPRESSION:  #45 77 year old white female with new diagnosis of pancreatic adenocarcinoma made at the time of EUS/biopsy on 09/11/2022. Patient has developed jaundice over the 2 weeks since that time and presents now with T. bili of 3.6/up to 4.6 today.  She is asymptomatic  Jaundice/elevated LFTs and hyperbilirubinemia secondary to progressive common bile duct obstruction due to the known pancreatic head adenocarcinoma.  #2 mild hyponatremia 3.  Dementia 4.  Hypertension  Plan; patient has been kept n.p.o. She is tentatively scheduled for ERCP with biliary stent placement with Dr. Meridee Score  at noon today, if timing/anesthesia esthesia allow.  If procedure is unable to be done at this time today then will be rescheduled to 7:30 AM tomorrow with Dr. Meridee Score. I discussed the procedure in detail with the patient, will also call her daughter who is her power of attorney to discuss in detail and obtain consent.   I spoke with patient's daughter Jasmine December by phone, and reviewed in detail indication for ERCP and stent placement, reviewed procedure in detail, including indications risk and benefits.  She will either call up to the nurses station to give consent or come in person within the next hour.  She is aware that the procedure may get bumped until tomorrow morning.    [place]sterwood[place]-C  09/26/2022, 8:36 AM

## 2022-09-27 ENCOUNTER — Inpatient Hospital Stay (HOSPITAL_COMMUNITY): Payer: Medicare Other | Admitting: Anesthesiology

## 2022-09-27 ENCOUNTER — Inpatient Hospital Stay (HOSPITAL_COMMUNITY): Payer: Medicare Other

## 2022-09-27 ENCOUNTER — Encounter (HOSPITAL_COMMUNITY): Payer: Self-pay | Admitting: Internal Medicine

## 2022-09-27 ENCOUNTER — Telehealth: Payer: Self-pay

## 2022-09-27 ENCOUNTER — Encounter (HOSPITAL_COMMUNITY)
Admission: EM | Disposition: A | Discharge status: Home / Self Care | Payer: Self-pay | Source: Ambulatory Visit | Attending: Internal Medicine

## 2022-09-27 DIAGNOSIS — C259 Malignant neoplasm of pancreas, unspecified: Secondary | ICD-10-CM

## 2022-09-27 DIAGNOSIS — I10 Essential (primary) hypertension: Secondary | ICD-10-CM

## 2022-09-27 DIAGNOSIS — K831 Obstruction of bile duct: Secondary | ICD-10-CM | POA: Diagnosis not present

## 2022-09-27 DIAGNOSIS — K838 Other specified diseases of biliary tract: Secondary | ICD-10-CM

## 2022-09-27 DIAGNOSIS — E785 Hyperlipidemia, unspecified: Secondary | ICD-10-CM

## 2022-09-27 DIAGNOSIS — F17210 Nicotine dependence, cigarettes, uncomplicated: Secondary | ICD-10-CM

## 2022-09-27 HISTORY — PX: BILIARY STENT PLACEMENT: SHX5538

## 2022-09-27 HISTORY — PX: SPHINCTEROTOMY: SHX5544

## 2022-09-27 HISTORY — PX: ENDOSCOPIC RETROGRADE CHOLANGIOPANCREATOGRAPHY (ERCP) WITH PROPOFOL: SHX5810

## 2022-09-27 HISTORY — PX: PANCREATIC STENT PLACEMENT: SHX5539

## 2022-09-27 LAB — GLUCOSE, CAPILLARY: Glucose-Capillary: 192 mg/dL — ABNORMAL HIGH (ref 70–99)

## 2022-09-27 SURGERY — ENDOSCOPIC RETROGRADE CHOLANGIOPANCREATOGRAPHY (ERCP) WITH PROPOFOL
Anesthesia: General

## 2022-09-27 MED ORDER — LABETALOL HCL 5 MG/ML IV SOLN
INTRAVENOUS | Status: DC | PRN
  Administered 2022-09-27: 2.5 mg via INTRAVENOUS

## 2022-09-27 MED ORDER — DICLOFENAC SUPPOSITORY 100 MG
RECTAL | Status: AC
  Filled 2022-09-27: qty 1

## 2022-09-27 MED ORDER — GLUCAGON HCL RDNA (DIAGNOSTIC) 1 MG IJ SOLR
INTRAMUSCULAR | Status: AC
  Filled 2022-09-27: qty 1

## 2022-09-27 MED ORDER — LACTATED RINGERS IV SOLN: INTRAVENOUS | Status: DC

## 2022-09-27 MED ORDER — ESMOLOL HCL 100 MG/10ML IV SOLN
INTRAVENOUS | Status: DC | PRN
  Administered 2022-09-27: 40 mg via INTRAVENOUS
  Administered 2022-09-27: 20 mg via INTRAVENOUS
  Administered 2022-09-27: 40 mg via INTRAVENOUS

## 2022-09-27 MED ORDER — LIDOCAINE HCL (CARDIAC) PF 100 MG/5ML IV SOSY
PREFILLED_SYRINGE | INTRAVENOUS | Status: DC | PRN
  Administered 2022-09-27: 80 mg via INTRAVENOUS

## 2022-09-27 MED ORDER — FENTANYL CITRATE (PF) 100 MCG/2ML IJ SOLN
INTRAMUSCULAR | Status: AC
  Filled 2022-09-27: qty 2

## 2022-09-27 MED ORDER — PROPOFOL 10 MG/ML IV BOLUS
INTRAVENOUS | Status: AC
  Filled 2022-09-27: qty 20

## 2022-09-27 MED ORDER — ONDANSETRON HCL 4 MG/2ML IJ SOLN
INTRAMUSCULAR | Status: DC | PRN
  Administered 2022-09-27: 4 mg via INTRAVENOUS

## 2022-09-27 MED ORDER — ROCURONIUM BROMIDE 100 MG/10ML IV SOLN
INTRAVENOUS | Status: DC | PRN
  Administered 2022-09-27: 50 mg via INTRAVENOUS

## 2022-09-27 MED ORDER — GLUCAGON HCL RDNA (DIAGNOSTIC) 1 MG IJ SOLR
INTRAMUSCULAR | Status: DC | PRN
  Administered 2022-09-27 (×2): .25 mg via INTRAVENOUS

## 2022-09-27 MED ORDER — DEXAMETHASONE SODIUM PHOSPHATE 10 MG/ML IJ SOLN
INTRAMUSCULAR | Status: DC | PRN
  Administered 2022-09-27: 8 mg via INTRAVENOUS

## 2022-09-27 MED ORDER — CIPROFLOXACIN IN D5W 400 MG/200ML IV SOLN
INTRAVENOUS | Status: DC | PRN
  Administered 2022-09-27: 400 mg via INTRAVENOUS

## 2022-09-27 MED ORDER — HEPARIN SODIUM (PORCINE) 5000 UNIT/ML IJ SOLN
5000.0000 [IU] | Freq: Three times a day (TID) | INTRAMUSCULAR | Status: DC
  Filled 2022-09-27: qty 1

## 2022-09-27 MED ORDER — SODIUM CHLORIDE 0.9 % IV SOLN
INTRAVENOUS | Status: DC | PRN
  Administered 2022-09-27: 10 mL

## 2022-09-27 MED ORDER — PROPOFOL 10 MG/ML IV BOLUS
INTRAVENOUS | Status: DC | PRN
  Administered 2022-09-27: 130 mg via INTRAVENOUS

## 2022-09-27 MED ORDER — DICLOFENAC SUPPOSITORY 100 MG
RECTAL | Status: DC | PRN
  Administered 2022-09-27: 100 mg via RECTAL

## 2022-09-27 MED ORDER — ONDANSETRON HCL 4 MG PO TABS: 4.0000 mg | ORAL_TABLET | ORAL | 0 refills | Status: DC | PRN

## 2022-09-27 MED ORDER — CIPROFLOXACIN IN D5W 400 MG/200ML IV SOLN
INTRAVENOUS | Status: AC
  Filled 2022-09-27: qty 200

## 2022-09-27 MED ORDER — SUGAMMADEX SODIUM 200 MG/2ML IV SOLN
INTRAVENOUS | Status: DC | PRN
  Administered 2022-09-27 (×2): 100 mg via INTRAVENOUS

## 2022-09-27 MED ORDER — SODIUM CHLORIDE 0.9 % IV SOLN: INTRAVENOUS | Status: DC

## 2022-09-27 MED ORDER — FENTANYL CITRATE (PF) 100 MCG/2ML IJ SOLN
INTRAMUSCULAR | Status: DC | PRN
  Administered 2022-09-27 (×2): 50 ug via INTRAVENOUS

## 2022-09-27 NOTE — Telephone Encounter (Signed)
-----   Message from [facility] sent at 09/27/2022  8:40 AM EDT ----- Regarding: Pancreatic Stent Followup Shahiem Bedwell or covering RN, This patient needs a 2-week KUB to follow-up the pancreas stent to make sure that it is fallen out. Please order that. Also, her 8/22 ERCP procedure can be canceled as we have completed that today. For now hold on adding someone to that slot and we will reevaluate where things stand next week to make some adjustments to the schedule. Thanks. GM

## 2022-09-27 NOTE — Telephone Encounter (Signed)
ERCP has been cancelled.  KUB has been entered pt to be called in 2 weeks.

## 2022-09-27 NOTE — Progress Notes (Signed)
Patient evaluated in the postprocedural recovery area. Patient describes a mild sensation of shortness of breath though she is satting in the 90s without oxygen on. Will allow her to wake up a bit more and begin incentive spirometry. If we need to put her on nasal cannula to help her shortness of breath we can and we can also obtain a chest x-ray, but we are holding on that for a bit of time but will reassess. Inpatient medicine service  Corliss Parish, MD [facility] Office # [phone]

## 2022-09-27 NOTE — Discharge Summary (Signed)
Physician Discharge Summary  [name] [name]:[id number] DOB: 1945-05-21 DOA: 09/25/2022  PCP: [name], No Pcp Per  Admit date: 09/25/2022 Discharge date: 09/27/2022 Recommendations for Outpatient Follow-up:  Follow up with PCP in 1 weeks-call for appointment FU Dr [name] Please obtain BMP/CBC in one week  Discharge Dispo: ALF Discharge Condition: Stable Code Status:   Code Status: Full Code Diet recommendation:  Diet Order             Diet full liquid Room service appropriate? Yes; Fluid consistency: Thin  Diet effective now           Diet - low sodium heart healthy                    Brief/Interim Summary: 77 y.o. female with medical history significant of HTN, HLD, anxiety/depression, GERD with barrett's esophagus, recent diagnosis of pancreatic cancer with CBD obstruction now with presenting progression of intra biliary  obstruction with worsening obstructive labs. [name] was seen as out [name] however due to no available slots for out [name] ERCP [name] was admitted to [facility] for expedite care.  Seen by GI underwent ERCP 8/1> seen by GI underwent ERCP - Gi was able to access the biliary tree and left her with a 10 mm x 6 cm stent. Drainage looks to be adequate> was monitored in the recovery noted to have some hypoxia on incentive spirometry but was also somnolent> a.m. doing well okay for discharge otherwise will monitor 1 more day in the hospital per GI.  GI planning to have LFTs rechecked in 1/2- 2 wk time with anticipation that they will improve.Dr. Myrle [name] was notified and he will involve surgical consult as outpatient.  [name] was able to take p.o. well tolerating, actually wanted to return to assisted living facility tonight if he tolerates diet later today she will be discharged home to assisted living facility daughter in agreement.     Discharge Diagnoses:  Principal Problem:   Biliary obstruction Active Problems:   Malignant neoplasm of head of pancreas  (HCC)   Biliary obstruction  Adenocarcinoma of the pancreas:  Disease localized to pancreas head, lab with elevated LFTs and bilirubin, seen by GI underwent ERCP - Gi was able to access the biliary tree and left her with a 10 mm x 6 cm stent. Drainage looks to be adequate> was monitored in the recovery noted to have some hypoxia on incentive spirometry but was also somnolent> a.m. doing well okay for discharge otherwise will monitor 1 more day in the hospital per GI.  GI planning to have LFTs rechecked in 1/2- 2 wk time with anticipation that they will improve.Dr. Myrle [name] was notified and he will involve surgical consult as outpatient.    Hyponatremia:Resolved Hypomagnesemia:Replaced  Hypertension: Currently BP well-controlled on amlodipine.  Holding HCTZ.  EtOH abuse in remission  GERD: Continue PPI.  History of angioedema due to ACE inhibitor on 09/09/2022  Dementia somewhat poor historian forgetful, Dr. Valorie [name]  Hyperlipidemia continue statin  Class I Obesity:[name]'s Body mass index is 33.79 kg/m. : Will benefit with PCP follow-up, weight loss  healthy lifestyle and outpatient sleep evaluation.  Consults: Gi, onco Subjective: Aa0x3. Eager to go home tonight after eating  Discharge Exam: Vitals:   09/27/22 0930 09/27/22 1402  BP: 127/69 129/75  Pulse: 78 97  Resp: 13 17  Temp:  98.7 F (37.1 C)  SpO2: 97% 96%   General: Pt is alert, awake, not in acute distress Cardiovascular: RRR, S1/S2 +, no rubs,  no gallops Respiratory: CTA bilaterally, no wheezing, no rhonchi Abdominal: Soft, NT, ND, bowel sounds + Extremities: no edema, no cyanosis  Discharge Instructions  Discharge Instructions     Diet - low sodium heart healthy   Complete by: As directed    Discharge instructions   Complete by: As directed    Please call call MD or return to ER for similar or worsening recurring problem that brought you to hospital or if any fever,nausea/vomiting,abdominal pain,  uncontrolled pain, chest pain,  shortness of breath or any other alarming symptoms.  Please follow-up your doctor as instructed in a week time and call the office for appointment.  Please avoid alcohol, smoking, or any other illicit substance and maintain healthy habits including taking your regular medications as prescribed.  You were cared for by a hospitalist during your hospital stay. If you have any questions about your discharge medications or the care you received while you were in the hospital after you are discharged, you can call the unit and ask to speak with the hospitalist on call if the hospitalist that took care of you is not available.  Once you are discharged, your primary care physician will handle any further medical issues. Please note that NO REFILLS for any discharge medications will be authorized once you are discharged, as it is imperative that you return to your primary care physician (or establish a relationship with a primary care physician if you do not have one) for your aftercare needs so that they can reassess your need for medications and monitor your lab values   Increase activity slowly   Complete by: As directed       Allergies as of 09/27/2022       Reactions   Codeine Itching   Atorvastatin Other (See Comments)   Unknown; not listed on MAR   Lisinopril Other (See Comments)   Angioedema   Aspirin Other (See Comments)   Reaction: stomach ache   Morphine Other (See Comments)   "severe headache"   Nsaids Other (See Comments)   STOMACH PAINS        Medication List     STOP taking these medications    lisinopril 20 MG tablet Commonly known as: ZESTRIL       TAKE these medications    acetaminophen 500 MG tablet Commonly known as: TYLENOL Take 1,000 mg by mouth in the morning, at noon, and at bedtime.   amLODipine 10 MG tablet Commonly known as: NORVASC Take 1 tablet (10 mg total) by mouth daily.   Aricept 10 MG tablet Generic drug:  donepezil Take 10 mg by mouth at bedtime.   bismuth subsalicylate 262 MG/15ML suspension Commonly known as: PEPTO BISMOL Take 15 mLs by mouth 4 (four) times daily as needed (abdominal pain).   buPROPion 300 MG 24 hr tablet Commonly known as: WELLBUTRIN XL Take 300 mg by mouth daily.   busPIRone 5 MG tablet Commonly known as: BUSPAR Take 5 mg by mouth 2 (two) times daily.   dicyclomine 20 MG tablet Commonly known as: BENTYL Take 20 mg by mouth 3 (three) times daily before meals.   Eszopiclone 3 MG Tabs Take 3 mg by mouth at bedtime.   Geri-Tussin 100 MG/5ML liquid Generic drug: guaiFENesin Take 200 mg by mouth every 6 (six) hours as needed for cough.   hydrochlorothiazide 12.5 MG capsule Commonly known as: MICROZIDE Take 12.5 mg by mouth daily.   hydrOXYzine 25 MG tablet Commonly known as: ATARAX Take 25 mg  by mouth 3 (three) times daily as needed for itching.   loratadine 10 MG tablet Commonly known as: CLARITIN Take 10 mg by mouth daily.   omeprazole 20 MG capsule Commonly known as: PRILOSEC Take 20 mg by mouth in the morning and at bedtime.   ondansetron 4 MG tablet Commonly known as: ZOFRAN Take 1 tablet (4 mg total) by mouth every 4 (four) hours as needed for nausea or vomiting.   pregabalin 100 MG capsule Commonly known as: LYRICA Take 100 mg by mouth 3 (three) times daily.   senna 8.6 MG Tabs tablet Commonly known as: SENOKOT Take 1 tablet by mouth daily.   sertraline 50 MG tablet Commonly known as: ZOLOFT Take 50 mg by mouth every evening.   simvastatin 40 MG tablet Commonly known as: ZOCOR Take 40 mg by mouth at bedtime.   Systane Balance 0.6 % Soln Generic drug: Propylene Glycol Place 1 drop into both eyes 3 (three) times daily as needed (dryness and itching).   traZODone 50 MG tablet Commonly known as: DESYREL Take 50 mg by mouth at bedtime.        Follow-up Information     Ladene Artist, MD Follow up in 3 day(s).   Specialty:  Oncology Contact information: [address] [place] [address] [phone]                Allergies  Allergen Reactions   Codeine Itching   Atorvastatin Other (See Comments)    Unknown; not listed on MAR   Lisinopril Other (See Comments)    Angioedema   Aspirin Other (See Comments)    Reaction: stomach ache   Morphine Other (See Comments)    "severe headache"   Nsaids Other (See Comments)    STOMACH PAINS    The results of significant diagnostics from this hospitalization (including imaging, microbiology, ancillary and laboratory) are listed below for reference.    Microbiology: No results found for this or any previous visit (from the past 240 hour(s)).  Procedures/Studies: DG ERCP  Result Date: 09/27/2022 CLINICAL DATA:  ERCP for recent diagnosis of pancreatic cancer. EXAM: ERCP TECHNIQUE: Multiple spot images obtained with the fluoroscopic device and submitted for interpretation post-procedure. FLUOROSCOPY TIME: FLUOROSCOPY TIME 1 minute, 44 seconds (43.3 mGy) COMPARISON:  CT the chest, abdomen pelvis-09/19/2022 FINDINGS: Six spot intraoperative fluoroscopic images of the right upper abdominal quadrant during ERCP are provided for review Initial image demonstrates an ERCP probe overlying the right upper abdominal quadrant Subsequent images demonstrate selective cannulation of both the common bile and pancreatic ducts. Subsequent images demonstrate faint opacification of the central aspect of the CBD which appears moderately dilated. Subsequent images demonstrate placement of a pancreatic stent as well as a CBD stent with narrowing at its mid and distal aspects at the suspected location of pancreatic head tumor. IMPRESSION: ERCP with CBD and pancreatic duct stenting as detailed above. These images were submitted for radiologic interpretation only. Please see the procedural report for the amount of contrast and the fluoroscopy time utilized. Electronically Signed    By: Simonne Come M.D.   On: 09/27/2022 09:08   CT CHEST ABDOMEN PELVIS W CONTRAST  Result Date: 09/19/2022 CLINICAL DATA:  Recently diagnosed pancreatic carcinoma. Staging. * Tracking Code: BO * EXAM: CT CHEST, ABDOMEN, AND PELVIS WITH CONTRAST TECHNIQUE: Multidetector CT imaging of the chest, abdomen and pelvis was performed following the standard protocol during bolus administration of intravenous contrast. RADIATION DOSE REDUCTION: This exam was performed according to the departmental  dose-optimization program which includes automated exposure control, adjustment of the mA and/or kV according to [name] size and/or use of iterative reconstruction technique. CONTRAST:  OMNIPAQUE IOHEXOL 300 MG/ML  SOLN COMPARISON:  MRI on 09/10/2022 FINDINGS: CT CHEST FINDINGS Cardiovascular: No acute findings. Aortic and coronary atherosclerotic calcification incidentally noted. Mediastinum/Lymph Nodes: No masses or pathologically enlarged lymph nodes identified. Lungs/Pleura: No suspicious pulmonary nodules or masses identified. No evidence of infiltrate or pleural effusion. Musculoskeletal:  No suspicious bone lesions identified. CT ABDOMEN AND PELVIS FINDINGS Hepatobiliary: No masses identified. Gallbladder is distended, however there is no evidence of cholecystitis. Diffuse biliary ductal dilatation is again seen, with common bile duct measuring 14 mm. Pancreas: Ill-defined mass is seen in the pancreatic head and uncinate process which measures 4.5 x 3.3 cm on image 69/2. This obstructs the distal common bile duct. Mild pancreatic ductal dilatation is also seen. No evidence of vascular encasement or invasion. Spleen:  Within normal limits in size and appearance. Adrenals/Urinary tract: No suspicious masses or hydronephrosis. Stomach/Bowel: No evidence of obstruction, inflammatory process, or abnormal fluid collections. Normal appendix visualized. Vascular/Lymphatic: No pathologically enlarged lymph nodes identified.  No acute vascular findings. Aortic atherosclerotic calcification incidentally noted. Reproductive: Limited visualization noted due to severe artifact from bilateral hip prostheses. Other:  None. Musculoskeletal: No suspicious bone lesions identified. Old T12 vertebral body compression deformity noted. Fusion hardware noted in the cervical and lumbar spine. Bilateral hip prostheses also noted. IMPRESSION: 4.5 cm mass in the pancreatic head and uncinate process, consistent with pancreatic carcinoma. No evidence of vascular involvement. Mild diffuse biliary and pancreatic ductal dilatation. No evidence of metastatic disease within the chest, abdomen, or pelvis. Aortic Atherosclerosis (ICD10-I70.0). Electronically Signed   By: Danae Orleans M.D.   On: 09/19/2022 12:00   DG CHEST PORT 1 VIEW  Result Date: 09/11/2022 CLINICAL DATA:  Abdominal pain EXAM: PORTABLE CHEST 1 VIEW COMPARISON:  05/24/2019 FINDINGS: Postoperative findings in the cervical spine. Atherosclerotic calcification of the aortic arch. Suspected subsegmental atelectasis or scarring along the lingula. The lungs appear otherwise clear. No blunting of the costophrenic angles. IMPRESSION: 1. Subsegmental atelectasis or scarring along the lingula. 2. Atherosclerosis. Electronically Signed   By: Gaylyn Rong M.D.   On: 09/11/2022 15:21   DG Abd Portable 2V  Result Date: 09/11/2022 CLINICAL DATA:  Postprocedural abdominal pain, status post endoscopy and endoscopic ultrasound with biopsy of a pancreatic head lesion EXAM: PORTABLE ABDOMEN - 2 VIEW COMPARISON:  MRI abdomen 09/10/2022 and abdominal radiograph 01/08/2009 FINDINGS: No free air identified. Scattered gas in nondilated small and large bowel. Bilateral hip prostheses. Posterolateral rod and pedicle screw fixation at L4-5. Loss of disc height and intervertebral spurring at L3-4. Bilateral tubal ligation clips. IMPRESSION: 1. No free air identified.  Bowel gas pattern unremarkable. 2.  Degenerative disc disease at L3-4. 3. Bilateral hip prostheses. Electronically Signed   By: Gaylyn Rong M.D.   On: 09/11/2022 15:18   MR ABDOMEN MRCP W WO CONTAST  Result Date: 09/10/2022 CLINICAL DATA:  Right upper quadrant abdominal pain, nondiagnostic ultrasound, biliary ductal dilatation EXAM: MRI ABDOMEN WITHOUT AND WITH CONTRAST (INCLUDING MRCP) TECHNIQUE: Multiplanar multisequence MR imaging of the abdomen was performed both before and after the administration of intravenous contrast. Heavily T2-weighted images of the biliary and pancreatic ducts were obtained, and three-dimensional MRCP images were rendered by post processing. CONTRAST:  10mL GADAVIST GADOBUTROL 1 MMOL/ML IV SOLN COMPARISON:  Right upper quadrant ultrasound, 09/10/2022 CT abdomen pelvis, 07/18/2019 FINDINGS: Lower chest: No acute abnormality.  Hepatobiliary: No solid liver abnormality is seen. Distended gallbladder. No gallstones. Intra and extrahepatic biliary ductal dilatation, the common bile duct measuring up to 1.2 cm in caliber. The duct tapers smoothly within the pancreatic head but is effaced above the level of the ampulla (series 3, image 15, series 5, image 27). Pancreas: Subtly hypoenhancing ill-defined lesion of the ventral pancreatic head measuring 2.8 x 2.1 cm (series 19, image 57). Mild dilatation of the pancreatic duct in the pancreatic neck measuring up to 0.5 cm (series 4, image 21). Spleen: Normal in size without significant abnormality. Adrenals/Urinary Tract: Adrenal glands are unremarkable. Kidneys are normal, without renal calculi, solid lesion, or hydronephrosis. Stomach/Bowel: Stomach is within normal limits. No evidence of bowel wall thickening, distention, or inflammatory changes. Vascular/Lymphatic: No significant vascular findings are present. No enlarged abdominal lymph nodes. Other: No abdominal wall hernia or abnormality. No ascites. Musculoskeletal: No acute or significant osseous findings.  IMPRESSION: 1. Subtly hypoenhancing ill-defined lesion of the ventral pancreatic head measuring 2.8 x 2.1 cm, highly suspicious for pancreatic adenocarcinoma. 2. Intra and extrahepatic biliary ductal dilatation, the common bile duct measuring up to 1.2 cm in caliber. The duct tapers smoothly within the pancreatic head but is effaced above the level of the ampulla. Mild dilatation of the pancreatic duct in the pancreatic neck measuring up to 0.5 cm. 3. No evidence of lymphadenopathy or metastatic disease in the abdomen. These results will be called to the ordering clinician or representative by the Radiologist Assistant, and communication documented in the PACS or [employer]. Electronically Signed   By: Jearld Lesch M.D.   On: 09/10/2022 15:39   MR 3D Recon At Scanner  Result Date: 09/10/2022 CLINICAL DATA:  Right upper quadrant abdominal pain, nondiagnostic ultrasound, biliary ductal dilatation EXAM: MRI ABDOMEN WITHOUT AND WITH CONTRAST (INCLUDING MRCP) TECHNIQUE: Multiplanar multisequence MR imaging of the abdomen was performed both before and after the administration of intravenous contrast. Heavily T2-weighted images of the biliary and pancreatic ducts were obtained, and three-dimensional MRCP images were rendered by post processing. CONTRAST:  10mL GADAVIST GADOBUTROL 1 MMOL/ML IV SOLN COMPARISON:  Right upper quadrant ultrasound, 09/10/2022 CT abdomen pelvis, 07/18/2019 FINDINGS: Lower chest: No acute abnormality. Hepatobiliary: No solid liver abnormality is seen. Distended gallbladder. No gallstones. Intra and extrahepatic biliary ductal dilatation, the common bile duct measuring up to 1.2 cm in caliber. The duct tapers smoothly within the pancreatic head but is effaced above the level of the ampulla (series 3, image 15, series 5, image 27). Pancreas: Subtly hypoenhancing ill-defined lesion of the ventral pancreatic head measuring 2.8 x 2.1 cm (series 19, image 57). Mild dilatation of the  pancreatic duct in the pancreatic neck measuring up to 0.5 cm (series 4, image 21). Spleen: Normal in size without significant abnormality. Adrenals/Urinary Tract: Adrenal glands are unremarkable. Kidneys are normal, without renal calculi, solid lesion, or hydronephrosis. Stomach/Bowel: Stomach is within normal limits. No evidence of bowel wall thickening, distention, or inflammatory changes. Vascular/Lymphatic: No significant vascular findings are present. No enlarged abdominal lymph nodes. Other: No abdominal wall hernia or abnormality. No ascites. Musculoskeletal: No acute or significant osseous findings. IMPRESSION: 1. Subtly hypoenhancing ill-defined lesion of the ventral pancreatic head measuring 2.8 x 2.1 cm, highly suspicious for pancreatic adenocarcinoma. 2. Intra and extrahepatic biliary ductal dilatation, the common bile duct measuring up to 1.2 cm in caliber. The duct tapers smoothly within the pancreatic head but is effaced above the level of the ampulla. Mild dilatation of the pancreatic duct in the  pancreatic neck measuring up to 0.5 cm. 3. No evidence of lymphadenopathy or metastatic disease in the abdomen. These results will be called to the ordering clinician or representative by the Radiologist Assistant, and communication documented in the PACS or [employer]. Electronically Signed   By: Jearld Lesch M.D.   On: 09/10/2022 15:39   US Abdomen Limited RUQ (LIVER/GB)  Result Date: 09/10/2022 CLINICAL DATA:  Elevated LFTs. EXAM: ULTRASOUND ABDOMEN LIMITED RIGHT UPPER QUADRANT COMPARISON:  CT abdomen 07/18/1999 FINDINGS: Gallbladder: Moderate distention of the gallbladder without wall thickening or gallstones. Reportedly, no sonographic Murphy sign. Common bile duct: Diameter: 1.5 cm Liver: Increased echogenicity. No focal lesion. At least mild intrahepatic biliary dilatation. Liver is heterogeneous. Portal vein is patent on color Doppler imaging with normal direction of blood flow towards  the liver. Other: None. IMPRESSION: 1. Distended gallbladder without gallstones. 2. Dilated common bile duct and intrahepatic biliary dilatation. Findings concerning for a biliary obstruction. Recommend further evaluation with MRCP. 3. Increased echogenicity of the liver is suggestive for hepatic steatosis. Electronically Signed   By: Richarda Overlie M.D.   On: 09/10/2022 12:10    Labs: BNP (last 3 results) No results for input(s): "BNP" in the last 8760 hours. Basic Metabolic Panel: Recent Labs  Lab 09/25/22 1032 09/25/22 1927 09/26/22 0535 09/27/22 0513  NA 130* 133* 136 136  K 4.1 3.7 3.5 3.7  CL 96* 101 103 102  CO2 24 22 21* 23  GLUCOSE 237* 181* 157* 132*  BUN 10 13 11 10   CREATININE 0.99 1.04* 0.67 0.61  CALCIUM 9.9 8.9 9.0 9.4  MG  --  1.6*  --   --   PHOS  --  4.0  --   --    Liver Function Tests: Recent Labs  Lab 09/25/22 1032 09/25/22 1927 09/26/22 0535 09/27/22 0513  AST 62* 64* 61* 73*  ALT 131* 117* 107* 109*  ALKPHOS 384* 333* 314* 319*  BILITOT 3.6* 3.2* 4.6* 5.9*  PROT 7.8 7.2 6.9 7.2  ALBUMIN 4.6 3.7 3.5 3.6   No results for input(s): "LIPASE", "AMYLASE" in the last 168 hours. No results for input(s): "AMMONIA" in the last 168 hours. CBC: Recent Labs  Lab 09/25/22 1032 09/25/22 1927 09/26/22 0535 09/27/22 0513  WBC 9.6 10.2 8.0 8.4  NEUTROABS 6.3 5.4  --   --   HGB 13.8 12.6 12.2 12.7  HCT 42.7 39.5 37.6 39.1  MCV 96.0 96.3 94.7 97.5  PLT 232 211 192 190   Cardiac Enzymes: No results for input(s): "CKTOTAL", "CKMB", "CKMBINDEX", "TROPONINI" in the last 168 hours. BNP: Invalid input(s): "POCBNP" CBG: Recent Labs  Lab 09/26/22 0812 09/26/22 1140 09/26/22 1617 09/26/22 2100 09/27/22 1045  GLUCAP 175* 164* 120* 119* 192*   D-Dimer No results for input(s): "DDIMER" in the last 72 hours. Hgb A1c Recent Labs    09/25/22 1927  HGBA1C 6.4*      Component Value Date/Time   COLORURINE STRAW (A) 07/18/2019 0605   APPEARANCEUR CLEAR  07/18/2019 0605   LABSPEC 1.008 07/18/2019 0605   PHURINE 5.0 07/18/2019 0605   GLUCOSEU NEGATIVE 07/18/2019 0605   GLUCOSEU NEGATIVE 01/15/2011 1122   HGBUR NEGATIVE 07/18/2019 0605   BILIRUBINUR NEGATIVE 07/18/2019 0605   KETONESUR NEGATIVE 07/18/2019 0605   PROTEINUR NEGATIVE 07/18/2019 0605   UROBILINOGEN 0.2 01/15/2011 1122   NITRITE NEGATIVE 07/18/2019 0605   LEUKOCYTESUR NEGATIVE 07/18/2019 0605   Sepsis Labs Recent Labs  Lab 09/25/22 1032 09/25/22 1927 09/26/22 0535 09/27/22 7253  WBC 9.6 10.2 8.0 8.4   Microbiology No results found for this or any previous visit (from the past 240 hour(s)).   Time coordinating discharge: 25 minutes  SIGNED: Lanae Boast, MD  [facility] 09/27/2022, 6:52 PM  If 7PM-7AM, please contact night-coverage [email or web address]

## 2022-09-27 NOTE — Plan of Care (Signed)
  Problem: Education: Goal: Knowledge of General Education information will improve Description: Including pain rating scale, medication(s)/side effects and non-pharmacologic comfort measures Outcome: Progressing   Problem: Health Behavior/Discharge Planning: Goal: Ability to manage health-related needs will improve Outcome: Progressing   Problem: Clinical Measurements: Goal: Ability to maintain clinical measurements within normal limits will improve Outcome: Progressing Goal: Will remain free from infection Outcome: Progressing   Problem: Activity: Goal: Risk for activity intolerance will decrease Outcome: Progressing   Problem: Coping: Goal: Level of anxiety will decrease Outcome: Progressing   Problem: Pain Managment: Goal: General experience of comfort will improve Outcome: Progressing   Problem: Safety: Goal: Ability to remain free from injury will improve Outcome: Progressing

## 2022-09-27 NOTE — Hospital Course (Addendum)
77 y.o. female with medical history significant of HTN, HLD, anxiety/depression, GERD with barrett's esophagus, recent diagnosis of pancreatic cancer with CBD obstruction now with presenting progression of intra biliary  obstruction with worsening obstructive labs. Patient was seen as out patient however due to no available slots for out patient ERCP patient was admitted to [facility] for expedite care.  Seen by GI underwent ERCP 8/1> seen by GI underwent ERCP - Gi was able to access the biliary tree and left her with a 10 mm x 6 cm stent. Drainage looks to be adequate> was monitored in the recovery noted to have some hypoxia on incentive spirometry but was also somnolent> a.m. doing well okay for discharge otherwise will monitor 1 more day in the hospital per GI.  GI planning to have LFTs rechecked in 1/2- 2 wk time with anticipation that they will improve.Dr. [name] was notified and he will involve surgical consult as outpatient.  Patient was able to take p.o. well tolerating, actually wanted to return to assisted living facility tonight if he tolerates diet later today she will be discharged home to assisted living facility daughter in agreement.

## 2022-09-27 NOTE — Anesthesia Procedure Notes (Addendum)
Procedure Name: Intubation Date/Time: 09/27/2022 7:47 AM  Performed by: Collene Schlichter, MDPre-anesthesia Checklist: Patient identified, Emergency Drugs available, Suction available and Patient being monitored Patient Re-evaluated:Patient Re-evaluated prior to induction Oxygen Delivery Method: Circle System Utilized Preoxygenation: Pre-oxygenation with 100% oxygen Induction Type: IV induction Ventilation: Mask ventilation without difficulty Laryngoscope Size: Glidescope and 3 Grade View: Grade II Tube type: Oral Tube size: 7.0 mm Number of attempts: 2 Airway Equipment and Method: Stylet and Oral airway Placement Confirmation: ETT inserted through vocal cords under direct vision, positive ETCO2 and breath sounds checked- equal and bilateral Secured at: 21 cm Tube secured with: Tape Dental Injury: Teeth and Oropharynx as per pre-operative assessment

## 2022-09-27 NOTE — Interval H&P Note (Signed)
History and Physical Interval Note:  09/27/2022 7:26 AM  [name]  has presented today for surgery, with the diagnosis of biliary obstruction.  The various methods of treatment have been discussed with the patient and family. After consideration of risks, benefits and other options for treatment, the patient has consented to  Procedure(s): ENDOSCOPIC RETROGRADE CHOLANGIOPANCREATOGRAPHY (ERCP) WITH PROPOFOL (N/A) as a surgical intervention.  The patient's history has been reviewed, patient examined, no change in status, stable for surgery.  I have reviewed the patient's chart and labs.  Questions were answered to the patient's satisfaction.     The risks of an ERCP were discussed at length, including but not limited to the risk of perforation, bleeding, abdominal pain, post-ERCP pancreatitis (while usually mild can be severe and even life threatening).    [employer]

## 2022-09-27 NOTE — Op Note (Signed)
[facility] Patient Name: [name] Procedure Date: 09/27/2022 MRN: [id number] Attending MD: Corliss Parish , MD, [id number] Date of Birth: 10-12-1945 CSN: [id number] Age: 77 Admit Type: Inpatient Procedure:                ERCP Indications:              Malignant stricture of the common bile duct,                            Biliary dilation on Computed Tomogram Scan,                            Jaundice, Elevated liver enzymes Providers:                Corliss Parish, MD, Fransisca Connors, Harrington Challenger, Technician Referring MD:             Leighton Roach. [place], inpatient medical service,                            inpatient GI service Medicines:                General Anesthesia, Cipro 400 mg IV, Diclofenac 100                            mg rectal, Glucagon 0.5 mg IV Complications:            No immediate complications. Estimated Blood Loss:     Estimated blood loss was minimal. Procedure:                Pre-Anesthesia Assessment:                           - Prior to the procedure, a History and Physical                            was performed, and patient medications and                            allergies were reviewed. The patient's tolerance of                            previous anesthesia was also reviewed. The risks                            and benefits of the procedure and the sedation                            options and risks were discussed with the patient.                            All questions were answered, and informed consent  was obtained. Prior Anticoagulants: The patient has                            taken heparin, last dose was 1 day prior to                            procedure. ASA Grade Assessment: III - A patient                            with severe systemic disease. After reviewing the                            risks and benefits, the patient was deemed in                             satisfactory condition to undergo the procedure.                           After obtaining informed consent, the scope was                            passed under direct vision. Throughout the                            procedure, the patient's blood pressure, pulse, and                            oxygen saturations were monitored continuously. The                            [id number]-Q190V ([id number] Olympus duodenoscope was                            introduced through the mouth, and used to inject                            contrast into and used to inject contrast into the                            bile duct and ventral pancreatic duct. The ERCP was                            accomplished without difficulty. The patient                            tolerated the procedure. Scope In: Scope Out: Findings:      The scout film was normal.      The esophagus was successfully intubated under direct vision without       detailed examination of the pharynx, larynx, and associated structures,       and upper GI tract. The major papilla had a large intraduodenal portion       but was otherwise normal.      On first attempt at biliary cannulation we were  not successful while       using a wire-guided approach. This led to placement of the wire within       the pancreatic duct. Decision was made to pursue a double-wire approach.       The 0.035 inch soft Jagwire was left within the pancreatic duct.      Subsequently with further sphincterotome bowing and going into a       semilong position, a short 0.035 inch Soft Jagwire was passed into the       biliary tree. The Hydratome sphincterotome was passed over the guidewire       and the bile duct was then deeply cannulated. Contrast was injected. I       personally interpreted the bile duct images. The flow of contrast       through the ducts was poor. Image quality was adequate. Contrast       extended to the hepatic ducts. Opacification of the  entire biliary tree       except for the cystic duct and gallbladder was successful. The lower       third of the main duct contained a single severe stenosis 45 mm in       length. The middle third of the main bile duct and upper third of the       main bile duct were severely dilated. The largest diameter was 17 mm. An       8 mm biliary sphincterotomy was made with a monofilament Hydratome       sphincterotome using ERBE electrocautery. There was no       post-sphincterotomy bleeding.      To decrease the risk of post interventional pancreatitis, one 4 Fr by 5       cm temporary plastic pancreatic stent with a single external pigtail was       placed into the ventral pancreatic duct. The stent was in good position.      We turned our attention back to the biliary tree. One 10 mm by 6 cm       [employer] uncovered metal biliary stent was placed into the       common bile duct. The stent was in good position. There was adequate       drainage of the contrast we had injected. Thus I did not dilate the       stricture.      The duodenoscope was withdrawn from the patient. Impression:               - The major papilla had a large intraduodenal                            portion but otherwise was normal.                           - Double wire technique used to enter into the                            biliary tree.                           - A single severe biliary stricture was found in  the lower third of the main bile duct. The                            stricture was malignant appearing.                           - The upper third of the main bile duct and middle                            third of the main bile duct were severely dilated.                           - A biliary sphincterotomy was performed.                           - One uncovered metal biliary stent was placed into                            the common bile duct.                            - One temporary plastic pancreatic stent was placed                            into the ventral pancreatic duct to decrease PEP. Moderate Sedation:      Not Applicable - Patient had care per Anesthesia. Recommendation:           - The patient will be observed post-procedure,                            until all discharge criteria are met.                           - Return patient to hospital ward for ongoing care.                           - Advance diet as tolerated from a full liquid diet                            to a low-fat diet for the next week.                           - Watch for pancreatitis, bleeding, perforation,                            and cholangitis.                           - Observe patient's clinical course.                           - Check liver enzymes (AST, ALT, alkaline  phosphatase, bilirubin) in the morning.                           - Patient will need a KUB 2-view in 10-14 days to                            ensure pancreatic stent has migrated successfully.                            If still present at that time will need to be                            scheduled for EGD with stent pull. I will arrange                            this with my team.                           - If the patient remains an inpatient tomorrow, the                            [facility] service will see her in follow-up.                           - The findings and recommendations were discussed                            with the patient.                           - The findings and recommendations were discussed                            with the patient's family.                           - The findings and recommendations were discussed                            with the referring physician. Procedure Code(s):        --- Professional ---                           [id number], Endoscopic retrograde                            cholangiopancreatography  (ERCP); with placement of                            endoscopic stent into biliary or pancreatic duct,                            including pre- and post-dilation and guide wire  passage, when performed, including sphincterotomy,                            when performed, each stent                           43274, 59, Endoscopic retrograde                            cholangiopancreatography (ERCP); with placement of                            endoscopic stent into biliary or pancreatic duct,                            including pre- and post-dilation and guide wire                            passage, when performed, including sphincterotomy,                            when performed, each stent                           13086, 26, Endoscopic catheterization of the                            biliary ductal system, radiological supervision and                            interpretation Diagnosis Code(s):        --- Professional ---                           K83.1, Obstruction of bile duct                           K83.8, Other specified diseases of biliary tract                           R17, Unspecified jaundice                           R74.8, Abnormal levels of other serum enzymes CPT copyright 2022 American Medical Association. All rights reserved. The codes documented in this report are preliminary and upon coder review may  be revised to meet current compliance requirements. Corliss Parish, MD 09/27/2022 8:38:54 AM Number of Addenda: 0

## 2022-09-27 NOTE — Transfer of Care (Signed)
Immediate Anesthesia Transfer of Care Note  Patient: [name]  Procedure(s) Performed: ENDOSCOPIC RETROGRADE CHOLANGIOPANCREATOGRAPHY (ERCP) WITH PROPOFOL SPHINCTEROTOMY PANCREATIC STENT PLACEMENT BILIARY STENT PLACEMENT  Patient Location: PACU and Endoscopy Unit  Anesthesia Type:General  Level of Consciousness: awake, alert , and patient cooperative  Airway & Oxygen Therapy: Patient Spontanous Breathing and Patient connected to face mask oxygen  Post-op Assessment: Report given to RN and Post -op Vital signs reviewed and stable  Post vital signs: Reviewed and stable  Last Vitals:  Vitals Value Taken Time  BP 150/74 09/27/22 0851  Temp    Pulse 79 09/27/22 0852  Resp 15 09/27/22 0852  SpO2 100 % 09/27/22 0852  Vitals shown include unfiled device data.  Last Pain:  Vitals:   09/27/22 0656  TempSrc: Temporal  PainSc: 0-No pain         Complications: No notable events documented.

## 2022-09-29 ENCOUNTER — Encounter (HOSPITAL_COMMUNITY): Payer: Self-pay | Admitting: Gastroenterology

## 2022-09-30 NOTE — Anesthesia Postprocedure Evaluation (Signed)
Anesthesia Post Note  Patient: [name]  Procedure(s) Performed: ENDOSCOPIC RETROGRADE CHOLANGIOPANCREATOGRAPHY (ERCP) WITH PROPOFOL SPHINCTEROTOMY PANCREATIC STENT PLACEMENT BILIARY STENT PLACEMENT     Patient location during evaluation: Endoscopy Anesthesia Type: General Level of consciousness: awake and alert Pain management: pain level controlled Vital Signs Assessment: post-procedure vital signs reviewed and stable Respiratory status: spontaneous breathing, nonlabored ventilation, respiratory function stable and patient connected to nasal cannula oxygen Cardiovascular status: blood pressure returned to baseline and stable Postop Assessment: no apparent nausea or vomiting Anesthetic complications: no   No notable events documented.  Last Vitals:  Vitals:   09/27/22 0930 09/27/22 1402  BP: 127/69 129/75  Pulse: 78 97  Resp: 13 17  Temp:  37.1 C  SpO2: 97% 96%    Last Pain:  Vitals:   09/27/22 1402  TempSrc: Oral  PainSc:                  Collene Schlichter

## 2022-10-03 ENCOUNTER — Ambulatory Visit: Payer: Medicare Other | Admitting: Oncology

## 2022-10-08 ENCOUNTER — Other Ambulatory Visit: Payer: Self-pay | Admitting: *Deleted

## 2022-10-08 ENCOUNTER — Inpatient Hospital Stay: Payer: Medicare Other

## 2022-10-08 ENCOUNTER — Inpatient Hospital Stay: Payer: Medicare Other | Attending: Oncology | Admitting: Oncology

## 2022-10-08 VITALS — BP 126/75 | HR 86 | Temp 98.1°F | Resp 18 | Ht 65.0 in | Wt 209.6 lb

## 2022-10-08 DIAGNOSIS — C250 Malignant neoplasm of head of pancreas: Secondary | ICD-10-CM

## 2022-10-08 LAB — CMP (CANCER CENTER ONLY)
ALT: 16 U/L (ref 0–44)
AST: 11 U/L — ABNORMAL LOW (ref 15–41)
Albumin: 4.1 g/dL (ref 3.5–5.0)
Alkaline Phosphatase: 132 U/L — ABNORMAL HIGH (ref 38–126)
Anion gap: 9 (ref 5–15)
BUN: 12 mg/dL (ref 8–23)
CO2: 28 mmol/L (ref 22–32)
Calcium: 8.9 mg/dL (ref 8.9–10.3)
Chloride: 104 mmol/L (ref 98–111)
Creatinine: 0.8 mg/dL (ref 0.44–1.00)
GFR, Estimated: >60 mL/min (ref >60)
Glucose, Bld: 94 mg/dL (ref 70–99)
Potassium: 3.6 mmol/L (ref 3.5–5.1)
Sodium: 141 mmol/L (ref 135–145)
Total Bilirubin: 0.7 mg/dL (ref 0.3–1.2)
Total Protein: 6.7 g/dL (ref 6.5–8.1)

## 2022-10-08 LAB — CBC WITH DIFFERENTIAL (CANCER CENTER ONLY)
Abs Immature Granulocytes: 0.07 10*3/uL (ref 0.00–0.07)
Basophils Absolute: 0.1 10*3/uL (ref 0.0–0.1)
Basophils Relative: 1 %
Eosinophils Absolute: 0.1 10*3/uL (ref 0.0–0.5)
Eosinophils Relative: 1 %
HCT: 37.2 % (ref 36.0–46.0)
Hemoglobin: 12.5 g/dL (ref 12.0–15.0)
Immature Granulocytes: 1 %
Lymphocytes Relative: 32 %
Lymphs Abs: 3.6 10*3/uL (ref 0.7–4.0)
MCH: 31.4 pg (ref 26.0–34.0)
MCHC: 33.6 g/dL (ref 30.0–36.0)
MCV: 93.5 fL (ref 80.0–100.0)
Monocytes Absolute: 0.7 10*3/uL (ref 0.1–1.0)
Monocytes Relative: 6 %
Neutro Abs: 6.8 10*3/uL (ref 1.7–7.7)
Neutrophils Relative %: 59 %
Platelet Count: 264 10*3/uL (ref 150–400)
RBC: 3.98 MIL/uL (ref 3.87–5.11)
RDW: 12.9 % (ref 11.5–15.5)
WBC Count: 11.4 10*3/uL — ABNORMAL HIGH (ref 4.0–10.5)
nRBC: 0 % (ref 0.0–0.2)

## 2022-10-08 NOTE — Progress Notes (Signed)
[name] OFFICE PROGRESS NOTE   Diagnosis: Pancreas cancer  INTERVAL HISTORY:   [name] was admitted 09/25/2022 with jaundice and elevated liver enzymes.  She was taken to an ERCP by Dr. Meridee Score 09/27/2022.  A severe stenosis was noted in the lower main bile duct with proximal dilation.  An uncovered metal stent was placed in the common bile duct and a temporary pancreatic duct stent was placed.  She was discharged to home 09/27/2022.  She reports resolution of jaundice and pruritus.  She has intermittent discomfort at the anterior chest and mid to upper back since discharge from the hospital.  The pain is present for minutes and is relieved with Tylenol.  No associated symptoms.  She generally feels well.  Objective:  Vital signs in last 24 hours:  Blood pressure 126/75, pulse 86, temperature 98.1 F (36.7 C), temperature source Oral, resp. rate 18, height 5\' 5"  (1.651 m), weight 209 lb 9.6 oz (95.1 kg), SpO2 98%.    HEENT: Sclera anicteric Resp: Lungs clear bilaterally Cardio: Regular rate and rhythm GI: Soft and nontender, no hepatosplenomegaly Vascular: No leg edema Musculoskeletal: No spine or anterior chest wall tenderness   Lab Results:  Lab Results  Component Value Date   WBC 11.4 (H) 10/08/2022   HGB 12.5 10/08/2022   HCT 37.2 10/08/2022   MCV 93.5 10/08/2022   PLT 264 10/08/2022   NEUTROABS 6.8 10/08/2022    CMP  Lab Results  Component Value Date   NA 141 10/08/2022   K 3.6 10/08/2022   CL 104 10/08/2022   CO2 28 10/08/2022   GLUCOSE 94 10/08/2022   BUN 12 10/08/2022   CREATININE 0.80 10/08/2022   CALCIUM 8.9 10/08/2022   PROT 6.7 10/08/2022   ALBUMIN 4.1 10/08/2022   AST 11 (L) 10/08/2022   ALT 16 10/08/2022   ALKPHOS 132 (H) 10/08/2022   BILITOT 0.7 10/08/2022   GFRNONAA >60 10/08/2022   GFRAA >60 07/18/2019    Lab Results  Component Value Date   ZOX096 14 09/11/2022     Medications: I have reviewed the patient's current  medications.   Assessment/Plan: Adenocarcinoma of the pancreas Elevated liver enzymes 09/09/2022 Ultrasound abdomen 09/10/2022-distended gallbladder without gallstones, dilated common bile duct and intrahepatic biliary dilation MRI abdomen/MRCP 715 2024-2.8 x 2.1 cm pancreas head lesion, intra and extrahepatic biliary duct dilation, mild dilation of the pancreatic duct, no lymphadenopathy or evidence of metastatic disease EUS 09/11/2022-masslike region in the pancreas head 29 x 21 mm, T2 N0, dilation of the common bile duct and distended gallbladder, enlarged lymph node in the porta hepatis with benign appearance pancreas mass abuts the SMV EUS biopsy of pancreas mass 09/11/2022-adenocarcinoma CTs 09/19/2022-diffuse biliary duct dilation, distended gallbladder, ill-defined mass in the pancreas head measuring 4.5 x 3.3 cm with obstruction of the distal common bile duct and mild pancreatic duct dilation, no evidence of vascular encasement or invasion, no pathologically enlarged lymph nodes 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 History of heavy alcohol use, quit in 2011 Ongoing tobacco use Depression Hyperlipidemia Degenerative disc disease Hypertension Cognitive dysfunction, memory loss Neuropathy     Disposition: [name] has been diagnosed with pancreas cancer.  She underwent common duct stent placement 09/27/2022 after developing biliary obstruction.  The hyperbilirubinemia has resolved.  Her case was presented at the GI tumor conference on 09/26/2022.  The tumor is felt to potentially be resectable.  She is scheduled to see Dr. Freida Busman.  [name]  has decided she does not wish to consider surgery.  We discussed treatment options including observation, chemotherapy, and radiation.  We discussed gemcitabine/Abraxane chemotherapy.  She understands no therapy will be curative.  We discussed the expected survival of months in  the absence of treatment.  [name] is most comfortable with observation.  She will return for an office visit in 6 weeks.  She will call sooner for new symptoms.  I doubt the chest and upper back discomfort are related to the pancreas cancer, but this is possible.  She will use Tylenol for pain.  She will contact us if Tylenol does not relieve the pain.  [name] submitted blood for genetic testing today.  Thornton Papas, MD  10/08/2022  1:19 PM

## 2022-10-11 ENCOUNTER — Ambulatory Visit (HOSPITAL_BASED_OUTPATIENT_CLINIC_OR_DEPARTMENT_OTHER)
Admission: RE | Admit: 2022-10-11 | Discharge: 2022-10-11 | Disposition: A | Discharge status: Home / Self Care | Payer: Medicare Other | Source: Ambulatory Visit | Attending: Gastroenterology | Admitting: Gastroenterology

## 2022-10-11 ENCOUNTER — Telehealth: Payer: Self-pay

## 2022-10-11 ENCOUNTER — Other Ambulatory Visit: Payer: Self-pay | Admitting: Nurse Practitioner

## 2022-10-11 DIAGNOSIS — C250 Malignant neoplasm of head of pancreas: Secondary | ICD-10-CM

## 2022-10-11 DIAGNOSIS — C259 Malignant neoplasm of pancreas, unspecified: Secondary | ICD-10-CM | POA: Diagnosis present

## 2022-10-11 MED ORDER — TRAMADOL HCL 50 MG PO TABS
50.0000 mg | ORAL_TABLET | Freq: Three times a day (TID) | ORAL | Status: DC | PRN
Start: 2022-10-11 — End: 2022-10-12

## 2022-10-11 NOTE — Telephone Encounter (Signed)
The pt has been advised to come in for KUB. She will come in at her convenience.

## 2022-10-11 NOTE — Telephone Encounter (Signed)
-----   Message from Nurse  P sent at 09/27/2022 12:00 PM EDT ----- Pt needs KUB

## 2022-10-12 ENCOUNTER — Other Ambulatory Visit: Payer: Self-pay | Admitting: Nurse Practitioner

## 2022-10-12 ENCOUNTER — Telehealth: Payer: Self-pay | Admitting: Genetic Counselor

## 2022-10-12 DIAGNOSIS — C250 Malignant neoplasm of head of pancreas: Secondary | ICD-10-CM

## 2022-10-12 MED ORDER — TRAMADOL HCL 50 MG PO TABS: 50.0000 mg | ORAL_TABLET | Freq: Three times a day (TID) | ORAL | 0 refills | Status: DC | PRN

## 2022-10-16 ENCOUNTER — Other Ambulatory Visit: Payer: Medicare Other

## 2022-10-16 ENCOUNTER — Encounter: Payer: Medicare Other | Admitting: Genetic Counselor

## 2022-10-18 ENCOUNTER — Encounter (HOSPITAL_COMMUNITY): Payer: Self-pay

## 2022-10-18 ENCOUNTER — Ambulatory Visit (HOSPITAL_COMMUNITY): Admit: 2022-10-18 | Payer: Medicare Other | Admitting: Gastroenterology

## 2022-10-18 DIAGNOSIS — E785 Hyperlipidemia, unspecified: Secondary | ICD-10-CM | POA: Diagnosis present

## 2022-10-18 DIAGNOSIS — K219 Gastro-esophageal reflux disease without esophagitis: Secondary | ICD-10-CM | POA: Diagnosis present

## 2022-10-18 DIAGNOSIS — I10 Essential (primary) hypertension: Secondary | ICD-10-CM | POA: Diagnosis present

## 2022-10-18 DIAGNOSIS — C250 Malignant neoplasm of head of pancreas: Secondary | ICD-10-CM | POA: Diagnosis present

## 2022-10-18 SURGERY — ENDOSCOPIC RETROGRADE CHOLANGIOPANCREATOGRAPHY (ERCP) WITH PROPOFOL
Anesthesia: General

## 2022-10-22 ENCOUNTER — Encounter: Payer: Self-pay | Admitting: Genetic Counselor

## 2022-10-22 ENCOUNTER — Telehealth: Payer: Self-pay | Admitting: Genetic Counselor

## 2022-10-22 DIAGNOSIS — Z515 Encounter for palliative care: Secondary | ICD-10-CM | POA: Insufficient documentation

## 2022-10-22 DIAGNOSIS — Z1379 Encounter for other screening for genetic and chromosomal anomalies: Secondary | ICD-10-CM | POA: Insufficient documentation

## 2022-10-22 NOTE — Telephone Encounter (Signed)
I contacted Ms. Marcano's daughter, Jasmine December, to discuss Ms. Mojica's genetic testing results. No pathogenic variants were identified in the 71 genes analyzed. Detailed clinic note to follow.  The test report has been scanned into EPIC and is located under the Molecular Pathology section of the Results Review tab.  A portion of the result report is included below for reference.   Lalla Brothers, MS, [facility] Genetic Counselor [email or web address] (P) [phone]

## 2022-10-23 ENCOUNTER — Inpatient Hospital Stay: Payer: Medicare Other | Admitting: Oncology

## 2022-11-01 ENCOUNTER — Ambulatory Visit (HOSPITAL_BASED_OUTPATIENT_CLINIC_OR_DEPARTMENT_OTHER)
Admission: RE | Admit: 2022-11-01 | Discharge: 2022-11-01 | Disposition: A | Discharge status: Home / Self Care | Payer: Medicare Other | Source: Ambulatory Visit | Attending: Oncology | Admitting: Oncology

## 2022-11-01 ENCOUNTER — Inpatient Hospital Stay: Payer: Medicare Other | Attending: Oncology | Admitting: Oncology

## 2022-11-01 ENCOUNTER — Other Ambulatory Visit: Payer: Self-pay | Admitting: *Deleted

## 2022-11-01 VITALS — BP 129/69 | HR 82 | Temp 98.1°F | Resp 18 | Ht 65.0 in | Wt 218.6 lb

## 2022-11-01 DIAGNOSIS — M549 Dorsalgia, unspecified: Secondary | ICD-10-CM | POA: Insufficient documentation

## 2022-11-01 DIAGNOSIS — C250 Malignant neoplasm of head of pancreas: Secondary | ICD-10-CM

## 2022-11-01 DIAGNOSIS — G629 Polyneuropathy, unspecified: Secondary | ICD-10-CM | POA: Insufficient documentation

## 2022-11-01 DIAGNOSIS — M7989 Other specified soft tissue disorders: Secondary | ICD-10-CM | POA: Diagnosis not present

## 2022-11-01 DIAGNOSIS — Z720 Tobacco use: Secondary | ICD-10-CM | POA: Insufficient documentation

## 2022-11-01 DIAGNOSIS — I10 Essential (primary) hypertension: Secondary | ICD-10-CM | POA: Insufficient documentation

## 2022-11-01 NOTE — Progress Notes (Signed)
[name] OFFICE PROGRESS NOTE   Diagnosis: Pancreas cancer  INTERVAL HISTORY:   [name] returns prior to scheduled visit.  She noted right greater than left lower extremity swelling with discomfort at the right calf beginning on 10/31/2022.  The swelling has improved today.  She has back discomfort.  She is here today with [name].  Objective:  Vital signs in last 24 hours:  Blood pressure 129/69, pulse 82, temperature 98.1 F (36.7 C), temperature source Oral, resp. rate 18, height 5\' 5"  (1.651 m), weight 218 lb 9.6 oz (99.2 kg), SpO2 98%.     Resp: Lungs clear bilaterally Cardio: Regular rate and rhythm GI: Distended, nontender, no mass Vascular: Trace edema at the right greater than left leg below the knee.  Venous varicosities at the right lower leg.  No erythema. Musculoskeletal: The area of pain localizes to the lower thoracic/upper lumbar areas    Lab Results:  Lab Results  Component Value Date   WBC 11.4 (H) 10/08/2022   HGB 12.5 10/08/2022   HCT 37.2 10/08/2022   MCV 93.5 10/08/2022   PLT 264 10/08/2022   NEUTROABS 6.8 10/08/2022    CMP  Lab Results  Component Value Date   NA 141 10/08/2022   K 3.6 10/08/2022   CL 104 10/08/2022   CO2 28 10/08/2022   GLUCOSE 94 10/08/2022   BUN 12 10/08/2022   CREATININE 0.80 10/08/2022   CALCIUM 8.9 10/08/2022   PROT 6.7 10/08/2022   ALBUMIN 4.1 10/08/2022   AST 11 (L) 10/08/2022   ALT 16 10/08/2022   ALKPHOS 132 (H) 10/08/2022   BILITOT 0.7 10/08/2022   GFRNONAA >60 10/08/2022   GFRAA >60 07/18/2019    Lab Results  Component Value Date   QMV784 14 09/11/2022    Lab Results  Component Value Date   INR 1.0 09/25/2022   LABPROT 13.6 09/25/2022    Imaging:  US Venous Img Lower Bilateral  Result Date: 11/01/2022 CLINICAL DATA:  Bilateral lower extremity edema for 2 days. History of pancreatic adenocarcinoma EXAM: Bilateral LOWER EXTREMITY VENOUS DOPPLER ULTRASOUND TECHNIQUE:  Gray-scale sonography with compression, as well as color and duplex ultrasound, were performed to evaluate the deep venous system(s) from the level of the common femoral vein through the popliteal and proximal calf veins. COMPARISON:  None Available. FINDINGS: VENOUS Normal compressibility of the common femoral, superficial femoral, and popliteal veins, as well as the visualized calf veins. Visualized portions of profunda femoral vein and great saphenous vein unremarkable. No filling defects to suggest DVT on grayscale or color Doppler imaging. Doppler waveforms show normal direction of venous flow, normal respiratory plasticity and response to augmentation. Limited views of the contralateral common femoral vein are unremarkable. OTHER None. Limitations: none IMPRESSION: No evidence of bilateral lower extremity DVT Electronically Signed   By: Karen Kays M.D.   On: 11/01/2022 13:54    Medications: I have reviewed the patient's current medications.   Assessment/Plan: Adenocarcinoma of the pancreas Elevated liver enzymes 09/09/2022 Ultrasound abdomen 09/10/2022-distended gallbladder without gallstones, dilated common bile duct and intrahepatic biliary dilation MRI abdomen/MRCP 715 2024-2.8 x 2.1 cm pancreas head lesion, intra and extrahepatic biliary duct dilation, mild dilation of the pancreatic duct, no lymphadenopathy or evidence of metastatic disease EUS 09/11/2022-masslike region in the pancreas head 29 x 21 mm, T2 N0, dilation of the common bile duct and distended gallbladder, enlarged lymph node in the porta hepatis with benign appearance pancreas mass abuts the SMV EUS biopsy of pancreas  mass 09/11/2022-adenocarcinoma CTs 09/19/2022-diffuse biliary duct dilation, distended gallbladder, ill-defined mass in the pancreas head measuring 4.5 x 3.3 cm with obstruction of the distal common bile duct and mild pancreatic duct dilation, no evidence of vascular encasement or invasion, no pathologically enlarged  lymph nodes 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 History of heavy alcohol use, quit in 2011 Ongoing tobacco use Depression Hyperlipidemia Degenerative disc disease Hypertension Cognitive dysfunction, memory loss Neuropathy      Disposition: [name] has pancreas cancer.  She reconfirmed Laurie decision against treatment of the cancer.  She presents today with leg swelling.  A bilateral lower extremity Doppler is negative for DVT.  The swelling may be related to abdominal/pelvic venous compression from tumor or ascites.  She could have a proximal thrombus, but this is unlikely.  She has gained weight.  I suspect she has ascites.  [name] will call for increased abdominal distention or discomfort related to the distention.  She will use tramadol for the back pain.  I suspect the back pain is related to pancreas cancer.  She will call if tramadol does not relieve the pain.  We discussed the prognosis.  She has decided against treatment.  I recommend hospice care.  She agrees.  We discussed CPR and ACLS.  She will be placed on a no CODE BLUE status.  [name] will return for an office visit in approximately 6 weeks.  Thornton Papas, MD  11/01/2022  2:44 PM

## 2022-11-02 ENCOUNTER — Other Ambulatory Visit: Payer: Self-pay | Admitting: *Deleted

## 2022-11-02 ENCOUNTER — Telehealth: Payer: Self-pay | Admitting: *Deleted

## 2022-11-02 MED ORDER — ALPRAZOLAM 0.25 MG PO TABS: 0.2500 mg | ORAL_TABLET | Freq: Three times a day (TID) | ORAL | 0 refills | Status: DC | PRN

## 2022-11-17 LAB — GENETIC SCREENING ORDER

## 2022-11-21 ENCOUNTER — Inpatient Hospital Stay: Payer: Medicare Other | Admitting: Oncology

## 2022-11-22 ENCOUNTER — Encounter (HOSPITAL_COMMUNITY): Payer: Self-pay | Admitting: Gastroenterology

## 2022-12-13 ENCOUNTER — Other Ambulatory Visit (HOSPITAL_COMMUNITY): Payer: Self-pay

## 2023-01-10 ENCOUNTER — Inpatient Hospital Stay: Payer: Medicare Other | Attending: Oncology | Admitting: Oncology

## 2023-01-10 DIAGNOSIS — C250 Malignant neoplasm of head of pancreas: Secondary | ICD-10-CM | POA: Diagnosis not present

## 2023-01-10 NOTE — Progress Notes (Signed)
[facility]   HEMATOLOGY- ONCOLOGY TeleHEALTH OFFICE VISIT PROGRESS NOTE  I connected withon 01/10/23 at  2:40 PM EST by telephone and verified that I am speaking with the correct person using two identifiers.   I discussed the limitations, risks, security and privacy concerns of performing an evaluation and management service by telemedicine and the availability of in-person appointments. I also discussed with the patient that there may be a patient responsible charge related to this service. The patient expressed understanding and agreed to proceed.  Other persons participating in the visit and their role in the encounter: Daughter  Patient's location: Home Provider's location: Office    Diagnosis: Pancreas cancer  INTERVAL HISTORY:   Ms Bethune has a history of localized pancreas cancer.  She decided against treatment of the cancer.  She has enrolled in hospice care.  She is living in the [facility] facility.  The hospice nurse is visiting weekly.  She reports back pain.  The pain is controlled with hydrocodone.  No difficulty with bowel function.  She has right greater than left lower leg swelling.  No leg pain at present her appetite is poor.  She participates in activities.    Lab Results:  Lab Results  Component Value Date   WBC 11.4 (H) 10/08/2022   HGB 12.5 10/08/2022   HCT 37.2 10/08/2022   MCV 93.5 10/08/2022   PLT 264 10/08/2022   NEUTROABS 6.8 10/08/2022    Imaging:  No results found.  Medications: I have reviewed the patient's current medications.  Assessment/Plan: Adenocarcinoma of the pancreas Elevated liver enzymes 09/09/2022 Ultrasound abdomen 09/10/2022-distended gallbladder without gallstones, dilated common bile duct and intrahepatic biliary dilation MRI abdomen/MRCP 715 2024-2.8 x 2.1 cm pancreas head lesion, intra and extrahepatic biliary duct dilation, mild dilation of the pancreatic duct, no lymphadenopathy or evidence of  metastatic disease EUS 09/11/2022-masslike region in the pancreas head 29 x 21 mm, T2 N0, dilation of the common bile duct and distended gallbladder, enlarged lymph node in the porta hepatis with benign appearance pancreas mass abuts the [facility] EUS biopsy of pancreas mass 09/11/2022-adenocarcinoma CTs 09/19/2022-diffuse biliary duct dilation, distended gallbladder, ill-defined mass in the pancreas head measuring 4.5 x 3.3 cm with obstruction of the distal common bile duct and mild pancreatic duct dilation, no evidence of vascular encasement or invasion, no pathologically enlarged lymph nodes 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 History of heavy alcohol use, quit in 2011 Ongoing tobacco use Depression Hyperlipidemia Degenerative disc disease Hypertension Cognitive dysfunction, memory loss Neuropathy      Disposition: Ms. Schlein has pancreas cancer.  She has enrolled in hospice care.  Her overall performance status appears to be unchanged.  She will continue follow-up with the hospice RN.  I am available to participate in her care as needed.  She would like to continue follow-up at the Cancer center.  She will be scheduled for a telephone visit in 2 months.  Ms. Arabian will discuss discontinuing amlodipine and Lyrica with the [facility] providers as this could be contributing to the lower extremity edema.  She will let us know if one of the legs becomes red or painful and we will plan for a repeat Doppler.   I discussed the assessment and treatment plan with the patient. The patient was provided an opportunity to ask questions and all were answered. The patient agreed with the plan and demonstrated an understanding of the instructions.   The patient was advised  to call back or seek an in-person evaluation if the symptoms worsen or if the condition fails to improve as anticipated.  I provided 20 minutes of telephone, chart  review, and documentation time during this encounter, and > 50% was spent counseling as documented under my assessment & plan.  Thornton Papas MD  01/10/2023 2:09 PM

## 2023-01-16 NOTE — Telephone Encounter (Signed)
Telephone call  

## 2023-03-26 ENCOUNTER — Inpatient Hospital Stay: Payer: Medicare Other | Attending: Oncology | Admitting: Oncology

## 2023-03-26 DIAGNOSIS — C250 Malignant neoplasm of head of pancreas: Secondary | ICD-10-CM

## 2023-03-26 NOTE — Progress Notes (Signed)
[name]   HEMATOLOGY- ONCOLOGY TeleHEALTH OFFICE VISIT PROGRESS NOTE  I connected with Haig Prophet on 03/26/23 at  2:40 PM EST by telephone and verified that I am speaking with the correct person using two identifiers.   I discussed the limitations, risks, security and privacy concerns of performing an evaluation and management service by telemedicine and the availability of in-person appointments. I also discussed with the patient that there may be a patient responsible charge related to this service. The patient expressed understanding and agreed to proceed.   Patient's location: Home Provider's location: Office    Diagnosis: Pancreas cancer  INTERVAL HISTORY:   [name] is seen today for a telehealth visit per her request.  Her daughter was not present for today's visit.  [name] reports generally feeling well.  She has no pain.  She reports she is taking hydrocodone.  Her appetite is slightly diminished.  She recently had swelling of 1 leg.  This has resolved.  The hospice nurse is seeing her weekly.  She is active getting out of her room.  She was playing bingo when we called her today.  No new complaint.  Objective:   Lab Results:  Lab Results  Component Value Date   WBC 11.4 (H) 10/08/2022   HGB 12.5 10/08/2022   HCT 37.2 10/08/2022   MCV 93.5 10/08/2022   PLT 264 10/08/2022   NEUTROABS 6.8 10/08/2022    Imaging:  No results found.  Medications: I have reviewed the patient's current medications.  Assessment/Plan: Adenocarcinoma of the pancreas Elevated liver enzymes 09/09/2022 Ultrasound abdomen 09/10/2022-distended gallbladder without gallstones, dilated common bile duct and intrahepatic biliary dilation MRI abdomen/MRCP 715 2024-2.8 x 2.1 cm pancreas head lesion, intra and extrahepatic biliary duct dilation, mild dilation of the pancreatic duct, no lymphadenopathy or evidence of metastatic disease EUS 09/11/2022-masslike region in the  pancreas head 29 x 21 mm, T2 N0, dilation of the common bile duct and distended gallbladder, enlarged lymph node in the porta hepatis with benign appearance pancreas mass abuts the [facility] EUS biopsy of pancreas mass 09/11/2022-adenocarcinoma CTs 09/19/2022-diffuse biliary duct dilation, distended gallbladder, ill-defined mass in the pancreas head measuring 4.5 x 3.3 cm with obstruction of the distal common bile duct and mild pancreatic duct dilation, no evidence of vascular encasement or invasion, no pathologically enlarged lymph nodes 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 History of heavy alcohol use, quit in 2011 Ongoing tobacco use Depression Hyperlipidemia Degenerative disc disease Hypertension Cognitive dysfunction, memory loss Neuropathy      Disposition: [name] has a history of pancreas cancer.  She is enrolled in hospice care.  Her clinical status appears unchanged.  It appears the cancer is progressing slowly.  She decided against treatment of the cancer.  I reviewed her current medication list.  [name] is unaware of medications she is taking.  We will contact her daughter and the residential facility to get an updated medication list.  [name] says she would like to continue follow-up at the cancer center.  She requested we contact her daughter to arrange for a telephone or in person visit.  I discussed the assessment and treatment plan with the patient. The patient was provided an opportunity to ask questions and all were answered. The patient agreed with the plan and demonstrated an understanding of the instructions.   The patient was advised to call back or seek an in-person evaluation if the symptoms worsen or if the  condition fails to improve as anticipated.  I provided 15 minutes of telephone, chart review, and documentation time during this encounter, and > 50% was spent counseling as documented  under my assessment & plan.  Thornton Papas MD  03/26/2023 2:39 PM

## 2023-03-28 ENCOUNTER — Telehealth: Payer: Self-pay | Admitting: *Deleted

## 2023-03-28 DIAGNOSIS — C250 Malignant neoplasm of head of pancreas: Secondary | ICD-10-CM

## 2023-03-28 NOTE — Telephone Encounter (Signed)
Notified daughter, Jasmine December that patient expressed that she wants to come to office to see Dr. Truett Perna and she agrees. She provides transportation, so will have scheduler call daughter to see MD in 2-3 months.

## 2023-03-28 NOTE — Addendum Note (Signed)
Addended by: Wandalee Ferdinand on: 03/28/2023 03:41 PM   Modules accepted: Orders

## 2023-05-30 ENCOUNTER — Other Ambulatory Visit: Payer: Medicare Other

## 2023-05-30 ENCOUNTER — Ambulatory Visit: Payer: Medicare Other | Admitting: Oncology

## 2023-06-07 ENCOUNTER — Telehealth: Payer: Self-pay | Admitting: *Deleted

## 2023-06-07 NOTE — Telephone Encounter (Signed)
 Spoke w/daughter to inquire if telephone visit could be moved from 4/14 to today at 1:40 pm per provider request. She declines and also requests that 4/14 be canceled. He mom is very confused now, but seems to be doing well with Hospice. She will reach out to office if needed.

## 2023-06-10 ENCOUNTER — Telehealth: Payer: Medicare Other | Admitting: Oncology

## 2023-08-18 ENCOUNTER — Emergency Department (HOSPITAL_COMMUNITY)

## 2023-08-18 ENCOUNTER — Encounter (HOSPITAL_COMMUNITY): Payer: Self-pay

## 2023-08-18 ENCOUNTER — Observation Stay (HOSPITAL_COMMUNITY): Admitting: Certified Registered Nurse Anesthetist

## 2023-08-18 ENCOUNTER — Other Ambulatory Visit: Payer: Self-pay

## 2023-08-18 ENCOUNTER — Observation Stay (HOSPITAL_COMMUNITY)

## 2023-08-18 ENCOUNTER — Inpatient Hospital Stay (HOSPITAL_COMMUNITY)
Admission: EM | Admit: 2023-08-18 | Discharge: 2023-08-21 | DRG: 493 | Disposition: A | Discharge status: Hospice | Attending: Infectious Diseases | Admitting: Infectious Diseases

## 2023-08-18 ENCOUNTER — Encounter (HOSPITAL_COMMUNITY)
Admission: EM | Disposition: A | Discharge status: Hospice | Payer: Self-pay | Source: Home / Self Care | Attending: Infectious Diseases

## 2023-08-18 DIAGNOSIS — I10 Essential (primary) hypertension: Secondary | ICD-10-CM | POA: Diagnosis not present

## 2023-08-18 DIAGNOSIS — S82841B Displaced bimalleolar fracture of right lower leg, initial encounter for open fracture type I or II: Principal | ICD-10-CM | POA: Diagnosis present

## 2023-08-18 DIAGNOSIS — S82891A Other fracture of right lower leg, initial encounter for closed fracture: Secondary | ICD-10-CM | POA: Diagnosis not present

## 2023-08-18 DIAGNOSIS — F17210 Nicotine dependence, cigarettes, uncomplicated: Secondary | ICD-10-CM | POA: Diagnosis not present

## 2023-08-18 DIAGNOSIS — W19XXXA Unspecified fall, initial encounter: Principal | ICD-10-CM

## 2023-08-18 DIAGNOSIS — R400 Somnolence: Secondary | ICD-10-CM | POA: Diagnosis present

## 2023-08-18 DIAGNOSIS — E876 Hypokalemia: Secondary | ICD-10-CM | POA: Diagnosis not present

## 2023-08-18 DIAGNOSIS — Z9842 Cataract extraction status, left eye: Secondary | ICD-10-CM

## 2023-08-18 DIAGNOSIS — Z66 Do not resuscitate: Secondary | ICD-10-CM | POA: Diagnosis present

## 2023-08-18 DIAGNOSIS — Z23 Encounter for immunization: Secondary | ICD-10-CM

## 2023-08-18 DIAGNOSIS — Z818 Family history of other mental and behavioral disorders: Secondary | ICD-10-CM

## 2023-08-18 DIAGNOSIS — Z811 Family history of alcohol abuse and dependence: Secondary | ICD-10-CM

## 2023-08-18 DIAGNOSIS — Z886 Allergy status to analgesic agent status: Secondary | ICD-10-CM

## 2023-08-18 DIAGNOSIS — Z9841 Cataract extraction status, right eye: Secondary | ICD-10-CM

## 2023-08-18 DIAGNOSIS — M1611 Unilateral primary osteoarthritis, right hip: Secondary | ICD-10-CM | POA: Diagnosis present

## 2023-08-18 DIAGNOSIS — Z96643 Presence of artificial hip joint, bilateral: Secondary | ICD-10-CM | POA: Diagnosis present

## 2023-08-18 DIAGNOSIS — Z888 Allergy status to other drugs, medicaments and biological substances status: Secondary | ICD-10-CM

## 2023-08-18 DIAGNOSIS — Z961 Presence of intraocular lens: Secondary | ICD-10-CM | POA: Diagnosis present

## 2023-08-18 DIAGNOSIS — Z981 Arthrodesis status: Secondary | ICD-10-CM

## 2023-08-18 DIAGNOSIS — R410 Disorientation, unspecified: Secondary | ICD-10-CM

## 2023-08-18 DIAGNOSIS — E785 Hyperlipidemia, unspecified: Secondary | ICD-10-CM | POA: Diagnosis present

## 2023-08-18 DIAGNOSIS — F1021 Alcohol dependence, in remission: Secondary | ICD-10-CM | POA: Diagnosis present

## 2023-08-18 DIAGNOSIS — Z8507 Personal history of malignant neoplasm of pancreas: Secondary | ICD-10-CM

## 2023-08-18 DIAGNOSIS — Z885 Allergy status to narcotic agent status: Secondary | ICD-10-CM

## 2023-08-18 DIAGNOSIS — S82891B Other fracture of right lower leg, initial encounter for open fracture type I or II: Secondary | ICD-10-CM | POA: Diagnosis present

## 2023-08-18 DIAGNOSIS — F32A Depression, unspecified: Secondary | ICD-10-CM | POA: Diagnosis present

## 2023-08-18 DIAGNOSIS — F05 Delirium due to known physiological condition: Secondary | ICD-10-CM | POA: Diagnosis present

## 2023-08-18 DIAGNOSIS — Z515 Encounter for palliative care: Secondary | ICD-10-CM

## 2023-08-18 DIAGNOSIS — F419 Anxiety disorder, unspecified: Secondary | ICD-10-CM | POA: Diagnosis present

## 2023-08-18 DIAGNOSIS — R451 Restlessness and agitation: Secondary | ICD-10-CM | POA: Diagnosis present

## 2023-08-18 DIAGNOSIS — W06XXXA Fall from bed, initial encounter: Secondary | ICD-10-CM | POA: Diagnosis present

## 2023-08-18 HISTORY — DX: Other complications of anesthesia, initial encounter: T88.59XA

## 2023-08-18 HISTORY — PX: ORIF ANKLE FRACTURE: SHX5408

## 2023-08-18 LAB — BASIC METABOLIC PANEL WITH GFR
Anion gap: 9 (ref 5–15)
BUN: 10 mg/dL (ref 8–23)
CO2: 30 mmol/L (ref 22–32)
Calcium: 8.3 mg/dL — ABNORMAL LOW (ref 8.9–10.3)
Chloride: 98 mmol/L (ref 98–111)
Creatinine, Ser: 0.82 mg/dL (ref 0.44–1.00)
GFR, Estimated: >60 mL/min (ref >60)
Glucose, Bld: 137 mg/dL — ABNORMAL HIGH (ref 70–99)
Potassium: 2.8 mmol/L — ABNORMAL LOW (ref 3.5–5.1)
Sodium: 137 mmol/L (ref 135–145)

## 2023-08-18 LAB — CBC WITH DIFFERENTIAL/PLATELET
Abs Immature Granulocytes: 0.06 10*3/uL (ref 0.00–0.07)
Basophils Absolute: 0.1 10*3/uL (ref 0.0–0.1)
Basophils Relative: 1 %
Eosinophils Absolute: 0 10*3/uL (ref 0.0–0.5)
Eosinophils Relative: 0 %
HCT: 37.8 % (ref 36.0–46.0)
Hemoglobin: 12.7 g/dL (ref 12.0–15.0)
Immature Granulocytes: 1 %
Lymphocytes Relative: 18 %
Lymphs Abs: 1.9 10*3/uL (ref 0.7–4.0)
MCH: 29.8 pg (ref 26.0–34.0)
MCHC: 33.6 g/dL (ref 30.0–36.0)
MCV: 88.7 fL (ref 80.0–100.0)
Monocytes Absolute: 0.6 10*3/uL (ref 0.1–1.0)
Monocytes Relative: 6 %
Neutro Abs: 8 10*3/uL — ABNORMAL HIGH (ref 1.7–7.7)
Neutrophils Relative %: 74 %
Platelets: 273 10*3/uL (ref 150–400)
RBC: 4.26 MIL/uL (ref 3.87–5.11)
RDW: 13.5 % (ref 11.5–15.5)
WBC: 10.7 10*3/uL — ABNORMAL HIGH (ref 4.0–10.5)
nRBC: 0 % (ref 0.0–0.2)

## 2023-08-18 LAB — SURGICAL PCR SCREEN
MRSA, PCR: NEGATIVE
Staphylococcus aureus: NEGATIVE

## 2023-08-18 SURGERY — OPEN REDUCTION INTERNAL FIXATION (ORIF) ANKLE FRACTURE
Anesthesia: General | Site: Ankle | Laterality: Right

## 2023-08-18 MED ORDER — HYDROMORPHONE HCL 1 MG/ML IJ SOLN
INTRAMUSCULAR | Status: AC
  Filled 2023-08-18: qty 0.5

## 2023-08-18 MED ORDER — AMISULPRIDE (ANTIEMETIC) 5 MG/2ML IV SOLN: 10.0000 mg | Freq: Once | INTRAVENOUS | Status: DC | PRN

## 2023-08-18 MED ORDER — ONDANSETRON HCL 4 MG/2ML IJ SOLN
INTRAMUSCULAR | Status: DC | PRN
  Administered 2023-08-18: 4 mg via INTRAVENOUS

## 2023-08-18 MED ORDER — FENTANYL CITRATE (PF) 100 MCG/2ML IJ SOLN: 25.0000 ug | INTRAMUSCULAR | Status: DC | PRN

## 2023-08-18 MED ORDER — PHENYLEPHRINE HCL-NACL 20-0.9 MG/250ML-% IV SOLN
INTRAVENOUS | Status: DC | PRN
  Administered 2023-08-18: 50 ug/min via INTRAVENOUS

## 2023-08-18 MED ORDER — OXYCODONE HCL 5 MG PO TABS
10.0000 mg | ORAL_TABLET | ORAL | Status: DC | PRN
  Administered 2023-08-18 – 2023-08-19 (×3): 10 mg via ORAL
  Filled 2023-08-18 (×3): qty 2

## 2023-08-18 MED ORDER — ROPIVACAINE HCL 5 MG/ML IJ SOLN
INTRAMUSCULAR | Status: DC | PRN
Start: 2023-08-18 — End: 2023-08-18
  Administered 2023-08-18: 30 mL via PERINEURAL
  Administered 2023-08-18: 20 mL via PERINEURAL

## 2023-08-18 MED ORDER — DEXAMETHASONE SODIUM PHOSPHATE 10 MG/ML IJ SOLN
INTRAMUSCULAR | Status: DC | PRN
  Administered 2023-08-18: 10 mg via INTRAVENOUS

## 2023-08-18 MED ORDER — ONDANSETRON HCL 4 MG/2ML IJ SOLN
INTRAMUSCULAR | Status: AC
  Filled 2023-08-18: qty 2

## 2023-08-18 MED ORDER — SODIUM CHLORIDE 0.9 % IR SOLN
Status: DC | PRN
  Administered 2023-08-18: 3000 mL

## 2023-08-18 MED ORDER — LACTATED RINGERS IV SOLN: INTRAVENOUS | Status: DC

## 2023-08-18 MED ORDER — LIDOCAINE 2% (20 MG/ML) 5 ML SYRINGE
INTRAMUSCULAR | Status: DC | PRN
  Administered 2023-08-18: 30 mg via INTRAVENOUS

## 2023-08-18 MED ORDER — POTASSIUM CHLORIDE ER 10 MEQ PO TBCR: 10.0000 meq | EXTENDED_RELEASE_TABLET | Freq: Every day | ORAL | 0 refills | Status: DC

## 2023-08-18 MED ORDER — CEFAZOLIN SODIUM-DEXTROSE 2-3 GM-%(50ML) IV SOLR
INTRAVENOUS | Status: DC | PRN
  Administered 2023-08-18: 2 g via INTRAVENOUS

## 2023-08-18 MED ORDER — HYDROMORPHONE HCL 1 MG/ML IJ SOLN
INTRAMUSCULAR | Status: DC | PRN
  Administered 2023-08-18 (×2): .5 mg via INTRAVENOUS

## 2023-08-18 MED ORDER — BUSPIRONE HCL 5 MG PO TABS
5.0000 mg | ORAL_TABLET | Freq: Two times a day (BID) | ORAL | Status: DC
  Administered 2023-08-19 – 2023-08-20 (×4): 5 mg via ORAL
  Filled 2023-08-18 (×5): qty 1

## 2023-08-18 MED ORDER — CLONIDINE HCL (ANALGESIA) 100 MCG/ML EP SOLN
EPIDURAL | Status: DC | PRN
  Administered 2023-08-18 (×2): 50 ug

## 2023-08-18 MED ORDER — AMLODIPINE BESYLATE 10 MG PO TABS
10.0000 mg | ORAL_TABLET | Freq: Every day | ORAL | Status: DC
  Administered 2023-08-19 – 2023-08-20 (×2): 10 mg via ORAL
  Filled 2023-08-18 (×3): qty 1

## 2023-08-18 MED ORDER — TRAZODONE HCL 50 MG PO TABS
50.0000 mg | ORAL_TABLET | Freq: Every evening | ORAL | Status: DC | PRN
  Administered 2023-08-19: 50 mg via ORAL
  Filled 2023-08-18: qty 1

## 2023-08-18 MED ORDER — TETANUS-DIPHTH-ACELL PERTUSSIS 5-2.5-18.5 LF-MCG/0.5 IM SUSY
0.5000 mL | PREFILLED_SYRINGE | Freq: Once | INTRAMUSCULAR | Status: AC
Start: 2023-08-18 — End: 2023-08-18
  Administered 2023-08-18: 0.5 mL via INTRAMUSCULAR
  Filled 2023-08-18: qty 0.5

## 2023-08-18 MED ORDER — ORAL CARE MOUTH RINSE: 15.0000 mL | Freq: Once | OROMUCOSAL | Status: AC

## 2023-08-18 MED ORDER — ENOXAPARIN SODIUM 40 MG/0.4ML IJ SOSY
40.0000 mg | PREFILLED_SYRINGE | INTRAMUSCULAR | Status: DC
  Administered 2023-08-18 – 2023-08-20 (×3): 40 mg via SUBCUTANEOUS
  Filled 2023-08-18 (×3): qty 0.4

## 2023-08-18 MED ORDER — CEFAZOLIN SODIUM-DEXTROSE 2-4 GM/100ML-% IV SOLN
2.0000 g | INTRAVENOUS | Status: AC
  Administered 2023-08-18: 2 g via INTRAVENOUS
  Filled 2023-08-18: qty 100

## 2023-08-18 MED ORDER — CEFAZOLIN SODIUM-DEXTROSE 2-4 GM/100ML-% IV SOLN
2.0000 g | Freq: Three times a day (TID) | INTRAVENOUS | Status: AC
  Administered 2023-08-18 – 2023-08-19 (×3): 2 g via INTRAVENOUS
  Filled 2023-08-18 (×3): qty 100

## 2023-08-18 MED ORDER — SENNA 8.6 MG PO TABS
1.0000 | ORAL_TABLET | Freq: Every day | ORAL | Status: DC
  Administered 2023-08-18 – 2023-08-20 (×3): 8.6 mg via ORAL
  Filled 2023-08-18 (×3): qty 1

## 2023-08-18 MED ORDER — PROPOFOL 500 MG/50ML IV EMUL
INTRAVENOUS | Status: DC | PRN
  Administered 2023-08-18: 120 ug/kg/min via INTRAVENOUS

## 2023-08-18 MED ORDER — CHLORHEXIDINE GLUCONATE 0.12 % MT SOLN
OROMUCOSAL | Status: AC
  Filled 2023-08-18: qty 15

## 2023-08-18 MED ORDER — LIDOCAINE 2% (20 MG/ML) 5 ML SYRINGE
INTRAMUSCULAR | Status: AC
  Filled 2023-08-18: qty 5

## 2023-08-18 MED ORDER — FENTANYL CITRATE (PF) 100 MCG/2ML IJ SOLN
50.0000 ug | Freq: Once | INTRAMUSCULAR | Status: AC
  Administered 2023-08-18: 50 ug via INTRAVENOUS

## 2023-08-18 MED ORDER — BUPROPION HCL ER (XL) 150 MG PO TB24
150.0000 mg | ORAL_TABLET | Freq: Every day | ORAL | Status: DC
  Administered 2023-08-19 – 2023-08-20 (×2): 150 mg via ORAL
  Filled 2023-08-18 (×3): qty 1

## 2023-08-18 MED ORDER — CHLORHEXIDINE GLUCONATE 0.12 % MT SOLN
15.0000 mL | Freq: Once | OROMUCOSAL | Status: AC
  Administered 2023-08-18: 15 mL via OROMUCOSAL

## 2023-08-18 MED ORDER — FENTANYL CITRATE (PF) 100 MCG/2ML IJ SOLN
INTRAMUSCULAR | Status: AC
  Filled 2023-08-18: qty 2

## 2023-08-18 MED ORDER — POTASSIUM CHLORIDE CRYS ER 20 MEQ PO TBCR
40.0000 meq | EXTENDED_RELEASE_TABLET | Freq: Once | ORAL | Status: AC
  Administered 2023-08-18: 40 meq via ORAL
  Filled 2023-08-18: qty 2

## 2023-08-18 MED ORDER — PROPOFOL 10 MG/ML IV BOLUS
INTRAVENOUS | Status: DC | PRN
  Administered 2023-08-18: 150 mg via INTRAVENOUS

## 2023-08-18 MED ORDER — HYDROMORPHONE HCL 1 MG/ML IJ SOLN
0.5000 mg | INTRAMUSCULAR | Status: DC | PRN
  Administered 2023-08-19 – 2023-08-20 (×3): 0.5 mg via INTRAVENOUS
  Filled 2023-08-18 (×3): qty 0.5

## 2023-08-18 MED ORDER — PROPOFOL 10 MG/ML IV BOLUS
INTRAVENOUS | Status: AC
  Filled 2023-08-18: qty 20

## 2023-08-18 MED ORDER — PHENYLEPHRINE 80 MCG/ML (10ML) SYRINGE FOR IV PUSH (FOR BLOOD PRESSURE SUPPORT)
PREFILLED_SYRINGE | INTRAVENOUS | Status: AC
  Filled 2023-08-18: qty 10

## 2023-08-18 MED ORDER — DEXAMETHASONE SODIUM PHOSPHATE 10 MG/ML IJ SOLN
INTRAMUSCULAR | Status: AC
  Filled 2023-08-18: qty 1

## 2023-08-18 MED ORDER — 0.9 % SODIUM CHLORIDE (POUR BTL) OPTIME
TOPICAL | Status: DC | PRN
  Administered 2023-08-18: 1000 mL

## 2023-08-18 MED ORDER — LIDOCAINE HCL (PF) 1 % IJ SOLN
10.0000 mL | Freq: Once | INTRAMUSCULAR | Status: AC
  Administered 2023-08-18: 10 mL
  Filled 2023-08-18: qty 10

## 2023-08-18 MED ORDER — PHENYLEPHRINE 80 MCG/ML (10ML) SYRINGE FOR IV PUSH (FOR BLOOD PRESSURE SUPPORT)
PREFILLED_SYRINGE | INTRAVENOUS | Status: DC | PRN
  Administered 2023-08-18: 80 ug via INTRAVENOUS

## 2023-08-18 MED ORDER — MIDAZOLAM HCL 2 MG/2ML IJ SOLN
INTRAMUSCULAR | Status: AC
  Filled 2023-08-18: qty 2

## 2023-08-18 SURGICAL SUPPLY — 71 items
BAG COUNTER SPONGE SURGICOUNT (BAG) ×1 IMPLANT
BANDAGE ESMARK 6X9 LF (GAUZE/BANDAGES/DRESSINGS) ×1 IMPLANT
BIT DRILL 2.4 AO COUPLING CANN (BIT) IMPLANT
BIT DRILL SHORT 2.0 ZI (BIT) IMPLANT
BIT DRILL SHORT 2.5 (BIT) IMPLANT
BNDG ELASTIC 3X5.8 VLCR NS LF (GAUZE/BANDAGES/DRESSINGS) IMPLANT
BNDG ELASTIC 4X5.8 VLCR NS LF (GAUZE/BANDAGES/DRESSINGS) IMPLANT
BNDG ELASTIC 4X5.8 VLCR STR LF (GAUZE/BANDAGES/DRESSINGS) IMPLANT
BNDG ELASTIC 6INX 5YD STR LF (GAUZE/BANDAGES/DRESSINGS) IMPLANT
BNDG GAUZE DERMACEA FLUFF 4 (GAUZE/BANDAGES/DRESSINGS) ×2 IMPLANT
BRUSH SCRUB EZ PLAIN DRY (MISCELLANEOUS) ×2 IMPLANT
COVER MAYO STAND STRL (DRAPES) ×1 IMPLANT
COVER SURGICAL LIGHT HANDLE (MISCELLANEOUS) ×1 IMPLANT
CUFF TRNQT CYL 34X4.125X (TOURNIQUET CUFF) ×1 IMPLANT
DRAPE C-ARM 42X72 X-RAY (DRAPES) ×1 IMPLANT
DRAPE C-ARMOR (DRAPES) ×1 IMPLANT
DRAPE HALF SHEET 40X57 (DRAPES) ×1 IMPLANT
DRAPE U-SHAPE 47X51 STRL (DRAPES) ×1 IMPLANT
DRIVER RETENTION T15 LONG (ORTHOPEDIC DISPOSABLE SUPPLIES) IMPLANT
DRSG EMULSION OIL 3X3 NADH (GAUZE/BANDAGES/DRESSINGS) IMPLANT
DRSG MEPITEL 4X7.2 (GAUZE/BANDAGES/DRESSINGS) IMPLANT
ELECTRODE REM PT RTRN 9FT ADLT (ELECTROSURGICAL) ×1 IMPLANT
GAUZE PAD ABD 8X10 STRL (GAUZE/BANDAGES/DRESSINGS) IMPLANT
GAUZE SPONGE 4X4 12PLY STRL (GAUZE/BANDAGES/DRESSINGS) ×2 IMPLANT
GLOVE BIO SURGEON STRL SZ7.5 (GLOVE) ×1 IMPLANT
GLOVE BIO SURGEON STRL SZ8 (GLOVE) ×1 IMPLANT
GLOVE BIOGEL PI IND STRL 7.5 (GLOVE) ×1 IMPLANT
GLOVE BIOGEL PI IND STRL 8 (GLOVE) ×1 IMPLANT
GLOVE SURG ORTHO LTX SZ7.5 (GLOVE) ×2 IMPLANT
GOWN STRL REUS W/ TWL LRG LVL3 (GOWN DISPOSABLE) ×2 IMPLANT
GOWN STRL REUS W/ TWL XL LVL3 (GOWN DISPOSABLE) ×1 IMPLANT
KIT BASIN OR (CUSTOM PROCEDURE TRAY) ×1 IMPLANT
KIT TURNOVER KIT B (KITS) ×1 IMPLANT
KWIRE ALPS MXV 1.6X6 ZI (WIRE) IMPLANT
KWIRE TROC 1.25X150 (WIRE) IMPLANT
MANIFOLD NEPTUNE II (INSTRUMENTS) ×1 IMPLANT
NDL HYPO 21X1.5 SAFETY (NEEDLE) IMPLANT
NEEDLE HYPO 21X1.5 SAFETY (NEEDLE) IMPLANT
NS IRRIG 1000ML POUR BTL (IV SOLUTION) ×1 IMPLANT
PACK GENERAL/GYN (CUSTOM PROCEDURE TRAY) ×1 IMPLANT
PACK ORTHO EXTREMITY (CUSTOM PROCEDURE TRAY) ×1 IMPLANT
PAD ARMBOARD POSITIONER FOAM (MISCELLANEOUS) ×2 IMPLANT
PAD CAST 4YDX4 CTTN HI CHSV (CAST SUPPLIES) IMPLANT
PADDING CAST ABS COTTON 3X4 (CAST SUPPLIES) IMPLANT
PADDING CAST ABS COTTON 4X4 ST (CAST SUPPLIES) IMPLANT
PADDING CAST COTTON 6X4 STRL (CAST SUPPLIES) IMPLANT
PLATE LAT FIB 6H RT (Plate) IMPLANT
SCREW CANNULATED 4.0MMX65 (Screw) IMPLANT
SCREW LOCK MDS 2.710 (Screw) IMPLANT
SCREW LOCK MDS 2.7X14 (Screw) IMPLANT
SCREW LOCK MDS 2.7X16 (Screw) IMPLANT
SCREW LOCK MDS 3.5X12 (Screw) IMPLANT
SCREW NLOCK 2.7X18 (Screw) IMPLANT
SCREW NLOCK 4.0X40 (Screw) IMPLANT
SCREW NON-LOCK 3.5X12 (Screw) IMPLANT
SET HNDPC FAN SPRY TIP SCT (DISPOSABLE) IMPLANT
SPLINT PLASTER CAST FAST 5X30 (CAST SUPPLIES) IMPLANT
SPONGE T-LAP 18X18 ~~LOC~~+RFID (SPONGE) ×1 IMPLANT
STAPLER SKIN PROX 35W (STAPLE) IMPLANT
SUCTION TUBE FRAZIER 10FR DISP (SUCTIONS) ×1 IMPLANT
SUT ETHILON 2 0 FS 18 (SUTURE) ×2 IMPLANT
SUT ETHILON 2 0 PSLX (SUTURE) IMPLANT
SUT PDS AB 2-0 CT1 27 (SUTURE) IMPLANT
SUT PROLENE 5 0 C1 (SUTURE) IMPLANT
SUT VIC AB 2-0 CT1 36 (SUTURE) IMPLANT
SUT VIC AB 2-0 CT1 TAPERPNT 27 (SUTURE) ×2 IMPLANT
TOWEL GREEN STERILE (TOWEL DISPOSABLE) ×2 IMPLANT
TOWEL GREEN STERILE FF (TOWEL DISPOSABLE) ×1 IMPLANT
TUBE CONNECTING 12X1/4 (SUCTIONS) ×1 IMPLANT
UNDERPAD 30X36 HEAVY ABSORB (UNDERPADS AND DIAPERS) ×1 IMPLANT
WATER STERILE IRR 1000ML POUR (IV SOLUTION) ×1 IMPLANT

## 2023-08-18 NOTE — H&P (Cosign Needed Addendum)
 Date: 08/18/2023         Patient Name:  [name] MRN: [id number]  DOB: September 21, 1945 Age / Sex: 78 y.o., female   PCP: Patient, No Pcp Per         Medical Service: Internal Medicine Teaching Service         Attending Physician: Dr. Trudy, Mliss Dragon, MD    First Contact: Dr. Drue Grow, MD Pager [phone] Pager:   Second Contact: Dr. Roetta Chars, MD  Pager:        After Hours (After 5p/  First Contact Pager: [phone]  weekends / holidays): Second Contact Pager: [phone]   Chief Concern: Fall   History of Present Illness:  [name] is a 78 year old female with a history of pancreatic cancer, currently under hospice care, who presents after a fall this morning. She was diagnosed with pancreatic cancer last July and has been receiving hospice care since then. She reports a poor appetite and has been eating less recently, which she feels is contributing to her growing weakness. She also believes her pancreatic cancer is worsening, leading to an increase in her pain medication.  She denies dizziness, though she has been falling more often. This morning, she rolled out of bed and fell to the ground, but did not hit her head. She denies shortness of breath, chest pain, or confusion.  Upon arrival to the ED, an x-ray of her right leg revealed multiple fractures, and Orthopedics was consulted, with surgery planned for this afternoon. Due to concerns about potential confusion after anesthesia, IMTS was consulted for post-operative management and to address her hypokalemia.  Review of Systems  Constitutional:  Negative for chills, fever and malaise/fatigue.  Respiratory:  Negative for cough.   Neurological:  Negative for dizziness.    Allergies: Allergies  Allergen Reactions   Codeine Itching   Atorvastatin Other (See Comments)    Unknown; not listed on MAR   Lisinopril  Other (See Comments)    Angioedema   Aspirin  Other (See Comments)    Reaction: stomach ache   Morphine  Other (See Comments)    severe headache   Nsaids Other (See Comments)    STOMACH PAINS     Past Medical History: Patient Active Problem List   Diagnosis Date Noted   Open right ankle fracture 08/18/2023   Genetic testing 10/22/2022   Malignant neoplasm of head of pancreas (HCC) 09/26/2022   Biliary obstruction 09/25/2022   Cancer of head of pancreas (HCC) 09/19/2022   Angioedema 09/09/2022   Anxiety and depression 09/09/2022   Elevated LFTs 09/09/2022   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) 05/19/2020   Neuropathy 05/19/2020   Status post total hip replacement, left 09/10/2017   Chest pain 11/29/2015   Primary osteoarthritis of right hip 08/04/2015   Preventative health care 01/15/2011   WEIGHT LOSS 06/09/2009   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   CERVICAL RADICULOPATHY, LEFT 10/12/2008   DIZZINESS 09/29/2008   DEGENERATIVE DISC DISEASE, CERVICAL SPINE, W/RADICULOPATHY 07/21/2008   Memory loss 01/09/2008   Depressive disorder, not elsewhere classified 07/16/2007   ANEMIA-NOS 01/14/2007   HTN (hypertension) 01/14/2007   DEGENERATIVE DISC DISEASE, CERVICAL SPINE 01/14/2007   LOW BACK PAIN 01/14/2007   HLD (hyperlipidemia) 10/25/2006   Migraine headache 10/25/2006   Insomnia, unspecified 10/25/2006   ALCOHOL ABUSE, HX OF 10/25/2006   DIVERTICULITIS, HX OF 10/25/2006   ANXIETY 09/19/2006   GERD 09/19/2006   Past Medical History:  Diagnosis Date   ALCOHOL ABUSE, HX  OF    ANEMIA-NOS    ANXIETY    CERVICAL RADICULOPATHY, LEFT    5 BACK OPERATIONS AND 2 NECK FUSIONS   DEGENERATIVE DISC DISEASE, CERVICAL SPINE    DEGENERATIVE DISC DISEASE, CERVICAL SPINE, W/RADICULOPATHY    Depressive disorder, not elsewhere classified    Depressive type psychosis    DIVERTICULITIS, HX OF    CLEARED ACCORDING TO PATIENT   DIZZINESS    GERD    HYPERLIPIDEMIA    HYPERTENSION    Insomnia, unspecified    LOW BACK PAIN    RUPTURED DISC   Memory loss    MIGRAINE HEADACHE  09/06/2017   NOT CURRENTLY   Palpitation    NOT A PROBLEM PER PATIENT   WEIGHT LOSS     Medications: Gabapentin 100mg  TID  Senna 8.6mg  daily  Sertraline  50mg   Trazodone  50mg   Meloxicam 7.5mg   Oxycodone  5mg  TID  Oxycodone  5mg  for breakthrough pain   Surgical History: Past Surgical History:  Procedure Laterality Date   BACK SURGERY  2008   X 5.Used her bone out of hips.   BILIARY STENT PLACEMENT N/A 09/27/2022   Procedure: BILIARY STENT PLACEMENT;  Surgeon: Wilhelmenia Aloha Raddle., MD;  Location: [facility];  Service: Gastroenterology;  Laterality: N/A;   BIOPSY  09/11/2022   Procedure: BIOPSY;  Surgeon: Wilhelmenia Aloha Raddle., MD;  Location: [facility];  Service: Gastroenterology;;   CERVICAL FUSION     X 2, [place], [place], [place]   DILATION AND CURETTAGE OF UTERUS     ENDOSCOPIC RETROGRADE CHOLANGIOPANCREATOGRAPHY (ERCP) WITH PROPOFOL  N/A 09/27/2022   Procedure: ENDOSCOPIC RETROGRADE CHOLANGIOPANCREATOGRAPHY (ERCP) WITH PROPOFOL ;  Surgeon: Wilhelmenia Aloha Raddle., MD;  Location: [facility];  Service: Gastroenterology;  Laterality: N/A;   ESOPHAGOGASTRODUODENOSCOPY N/A 01/03/2015   Procedure: ESOPHAGOGASTRODUODENOSCOPY (EGD);  Surgeon: Donnice Vaughn Manes, MD;  Location: [facility];  Service: Endoscopy;  Laterality: N/A;   ESOPHAGOGASTRODUODENOSCOPY N/A 09/11/2022   Procedure: ESOPHAGOGASTRODUODENOSCOPY (EGD);  Surgeon: Wilhelmenia Aloha Raddle., MD;  Location: [facility];  Service: Gastroenterology;  Laterality: N/A;   EYE SURGERY Bilateral    Cataract Extraction with IOL   FINE NEEDLE ASPIRATION N/A 09/11/2022   Procedure: FINE NEEDLE ASPIRATION (FNA) LINEAR;  Surgeon: Wilhelmenia Aloha Raddle., MD;  Location: [facility];  Service: Gastroenterology;  Laterality: N/A;   HYSTEROSCOPY WITH D & C N/A 11/05/2016   Procedure: DILATATION AND CURETTAGE /HYSTEROSCOPY/MYOSURE RESECTION OF ENDOMETRIAL POLYP;  Surgeon: Schermerhorn, Debby PARAS, MD;  Location: [facility];  Service:  Gynecology;  Laterality: N/A;   LUMBAR FUSION     PANCREATIC STENT PLACEMENT  09/27/2022   Procedure: PANCREATIC STENT PLACEMENT;  Surgeon: Wilhelmenia Aloha Raddle., MD;  Location: [facility];  Service: Gastroenterology;;   [name]  09/27/2022   Procedure: [name];  Surgeon: Wilhelmenia Aloha Raddle., MD;  Location: [facility];  Service: Gastroenterology;;   TONSILLECTOMY     TOTAL HIP ARTHROPLASTY Right 08/04/2015   Procedure: TOTAL HIP ARTHROPLASTY ANTERIOR APPROACH;  Surgeon: Ozell Flake, MD;  Location: [facility];  Service: Orthopedics;  Laterality: Right;   TOTAL HIP ARTHROPLASTY Left 09/10/2017   Procedure: TOTAL HIP ARTHROPLASTY ANTERIOR APPROACH;  Surgeon: Flake Ozell, MD;  Location: [facility];  Service: Orthopedics;  Laterality: Left;   UPPER ESOPHAGEAL ENDOSCOPIC ULTRASOUND (EUS) N/A 09/11/2022   Procedure: UPPER ESOPHAGEAL ENDOSCOPIC ULTRASOUND (EUS);  Surgeon: Wilhelmenia Aloha Raddle., MD;  Location: [facility];  Service: Gastroenterology;  Laterality: N/A;     Family History:  Family History  Problem Relation Age of Onset   Alcohol abuse Father  Depression Other    Alcohol abuse Other     Social History:  Social History   Socioeconomic History   Marital status: Widowed    Spouse name: Not on file   Number of children: Not on file   Years of education: Not on file   Highest education level: Not on file  Occupational History   Not on file  Tobacco Use   Smoking status: Every Day    Current packs/day: 0.50    Types: Cigarettes   Smokeless tobacco: Never  Vaping Use   Vaping status: Former  Substance and Sexual Activity   Alcohol use: No   Drug use: No   Sexual activity: Never  Other Topics Concern   Not on file  Social History Narrative   Not on file   Social Drivers of Health   Financial Resource Strain: Not on file  Food Insecurity: No Food Insecurity (09/25/2022)   Hunger Vital Sign    Worried About Running Out of Food in the Last Year:  Never true    Ran Out of Food in the Last Year: Never true  Transportation Needs: No Transportation Needs (09/25/2022)   PRAPARE - Administrator, Civil Service (Medical): No    Lack of Transportation (Non-Medical): No  Physical Activity: Not on file  Stress: Not on file  Social Connections: Not on file  Intimate Partner Violence: Not At Risk (09/25/2022)   Humiliation, Afraid, Rape, and Kick questionnaire    Fear of Current or Ex-Partner: No    Emotionally Abused: No    Physically Abused: No    Sexually Abused: No     Physical Exam:  Blood pressure 134/74, pulse 91, temperature 98.1 F (36.7 C), temperature source Oral, resp. rate 19, height 5' 6 (1.676 m), weight 90.7 kg, SpO2 92%.  Constitutional: well-appearing woman, sitting in bed  , in no acute distress Cardiovascular: regular rate and rhythm, Pulmonary/Chest: normal work of breathing on room air Abdominal: soft, non-tender, non-distended Neurological: alert & oriented x 3 MSK: Right lower leg wrapped in clean and dry wrap. Skin: warm and dry Psych: Normal mood and affect  EKG:  Sinus rate and rhythm  Labs:    Latest Ref Rng & Units 08/18/2023    6:37 AM 10/08/2022   11:02 AM 09/27/2022    5:13 AM  CBC  WBC 4.0 - 10.5 K/uL 10.7  11.4  8.4   Hemoglobin 12.0 - 15.0 g/dL 87.2  87.4  87.2   Hematocrit 36.0 - 46.0 % 37.8  37.2  39.1   Platelets 150 - 400 K/uL 273  264  190       Latest Ref Rng & Units 08/18/2023    6:37 AM 10/08/2022   11:02 AM 09/27/2022    5:13 AM  BMP  Glucose 70 - 99 mg/dL 862  94  867   BUN 8 - 23 mg/dL 10  12  10    Creatinine 0.44 - 1.00 mg/dL 9.17  9.19  9.38   Sodium 135 - 145 mmol/L 137  141  136   Potassium 3.5 - 5.1 mmol/L 2.8  3.6  3.7   Chloride 98 - 111 mmol/L 98  104  102   CO2 22 - 32 mmol/L 30  28  23    Calcium 8.9 - 10.3 mg/dL 8.3  8.9  9.4      Images and other studies:  Imaging: DG Ankle 2 Views Right Result Date: 08/18/2023 EXAM: 2 VIEW(S) XRAY OF  THE RIGHT  ANKLE 08/18/2023 06:08:14 AM CLINICAL HISTORY: Fall. COMPARISON: None available. FINDINGS: BONES AND JOINTS: Comminuted fracture dislocation and an oblique fracture are present in the distal fibula. An avulsion fracture is present in the medial malleolus. The talus is displaced laterally. A nondisplaced posterior tibial fracture is present. SOFT TISSUES: Extensive surrounding soft tissue swelling is present. IMPRESSION: 1. Comminuted fracture dislocation of the right ankle with oblique fracture of the distal fibula, avulsion fracture of the medial malleolus, lateral displacement of the talus, and nondisplaced posterior tibial fracture. 2. Extensive surrounding soft tissue swelling. Electronically signed by: Lonni Necessary MD 08/18/2023 06:23 AM EDT RP Workstation: [id number]      Assessment & Plan:  [name] is a 78 y.o. with past medical history of pancreatic cancer on hospice care who presented to the emergency department after a fall and found to have multiple right ankle fractures.   Principal Problem:   Open right ankle fracture  #Fracture of Right Ankle  #Oblique Fracture of Distal Fibula  #Avulsion Fracture of Medial Malleolus  Noted on imaging, pt states she just rolled out of bed and doesn't recall how this had happened. Ortho on board, plan for OR today for repair. Will also discuss pain regimen with them. Per daughter she has a history of being confused after surgery, as last year for a few days she was only AAOx1 when normally she's AAOx4.   #Hypokalemia  Likely in the setting of decreased appetite over the last two weeks or so. Will replenish and repeat RFP in the AM.   #Pancreatic Cancer  Follows with Heme/Onc outpatient, pt and family have elected for palliative route, and she is currently in hospice care. DNR/DNI confirmed.   #Anxiety/Depression  Holding her home anti-depressant and anxiety medication for now (sertraline  and trazodone ), will restart after surgery.      Level of care: Med-Surg Diet: NPO IVF: NA VTE:  Holding pending surgery  Code: DNR/DNI Surrogate: Daughter  Signed: Drue Grow, MD 08/18/2023, 11:46 AM

## 2023-08-18 NOTE — ED Triage Notes (Addendum)
 EMS reports unwitnessed fall.  No LOC, no blood thinners.  Laceration to R ankle.  Pt denies any other pain.  Pt states she rolled out of bed while asleep.

## 2023-08-18 NOTE — ED Provider Notes (Signed)
 [place] EMERGENCY DEPARTMENT AT [facility] Provider Note   CSN: [id number] Arrival date & time: 08/18/23  0535     History Chief Complaint  Patient presents with   Fall    HPI [name] is a 78 y.o. female presenting for fall at her skilled nursing facility. Denies fevers chills nausea vomiting or shortness of breath. States that she went to get up in the bathroom and when she put her foot down she went over the top of her foot and fell back into the bed.  Only source of pain is right ankle.  Patient's recorded medical, surgical, social, medication list and allergies were reviewed in the Snapshot window as part of the initial history.   Review of Systems   Review of Systems  Constitutional:  Negative for chills and fever.  HENT:  Negative for ear pain and sore throat.   Eyes:  Negative for pain and visual disturbance.  Respiratory:  Negative for cough and shortness of breath.   Cardiovascular:  Negative for chest pain and palpitations.  Gastrointestinal:  Negative for abdominal pain and vomiting.  Genitourinary:  Negative for dysuria and hematuria.  Musculoskeletal:  Negative for arthralgias and back pain.  Skin:  Negative for color change and rash.  Neurological:  Negative for seizures and syncope.  All other systems reviewed and are negative.   Physical Exam Updated Vital Signs BP 129/72   Pulse 90   Temp 98.2 F (36.8 C) (Oral)   Resp 17   Ht 5' 6 (1.676 m)   Wt 90.7 kg   SpO2 93%   BMI 32.28 kg/m  Physical Exam Vitals and nursing note reviewed.  Constitutional:      General: She is not in acute distress.    Appearance: She is well-developed.  HENT:     Head: Normocephalic and atraumatic.   Eyes:     Conjunctiva/sclera: Conjunctivae normal.    Cardiovascular:     Rate and Rhythm: Normal rate and regular rhythm.     Heart sounds: No murmur heard. Pulmonary:     Effort: Pulmonary effort is normal. No respiratory distress.     Breath  sounds: Normal breath sounds.  Abdominal:     General: There is no distension.     Palpations: Abdomen is soft.     Tenderness: There is no abdominal tenderness. There is no right CVA tenderness or left CVA tenderness.   Musculoskeletal:        General: Deformity and signs of injury (Laceration medial right foot) present. No swelling or tenderness. Normal range of motion.     Cervical back: Neck supple.   Skin:    General: Skin is warm and dry.   Neurological:     General: No focal deficit present.     Mental Status: She is alert and oriented to person, place, and time. Mental status is at baseline.     Cranial Nerves: No cranial nerve deficit.      ED Course/ Medical Decision Making/ A&P    Procedures .Laceration Repair  Date/Time: 08/18/2023 6:55 AM  Performed by: Jerral Meth, MD Authorized by: Jerral Meth, MD   Consent:    Consent obtained:  Verbal   Consent given by:  Patient   Risks, benefits, and alternatives were discussed: yes     Risks discussed:  Infection and pain   Alternatives discussed:  No treatment Universal protocol:    Patient identity confirmed:  Verbally with patient Laceration details:  Location:  Leg   Leg location:  R lower leg   Length (cm):  2.8 Pre-procedure details:    Preparation:  Patient was prepped and draped in usual sterile fashion Exploration:    Limited defect created (wound extended): yes     Hemostasis achieved with:  Direct pressure Treatment:    Area cleansed with:  Saline   Amount of cleaning:  Standard   Irrigation solution:  Sterile saline Skin repair:    Repair method:  Staples   Number of staples:  3 Approximation:    Approximation:  Close Post-procedure details:    Dressing:  Open (no dressing)    Medications Ordered in ED Medications  lidocaine  (PF) (XYLOCAINE ) 1 % injection 10 mL (has no administration in time range)    Medical Decision Making:    [name] is a 78 y.o. female who  presented to the ED today with a moderate mechanisma trauma, detailed above.    Given this mechanism of trauma, a full physical exam was performed. Notably, patient was HDS in NAD.   Reviewed and confirmed nursing documentation for past medical history, family history, social history.    Initial Assessment/Plan:   This is a patient presenting with a moderate mechanism trauma.  As such, I have considered intracranial injuries including intracranial hemorrhage, intrathoracic injuries including blunt myocardial or blunt lung injury, blunt abdominal injuries including aortic dissection, bladder injury, spleen injury, liver injury and I have considered orthopedic injuries including extremity or spinal injury.  Notably, patient's laceration on the right side does not probe to bone.  Not an open fracture.   With the patient's presentation of moderate mechanism trauma but an otherwise reassuring exam, patient warrants targeted evaluation for potential traumatic injuries. Will proceed with targeted evaluation for potential injuries. Will proceed with right ankle XR. Objective evaluation resulted with fracture.  I discussed with patient's family. They do not want to proceed with operative intervention as she is on hospice.  I consulted orthopedic surgery Dr. Celena.  He said that nonoperative management would be for placement into an ankle stirrup splint and it would be unlikely for her to be able to ambulate in the near future.  She would be nonweightbearing.  Called her current facility, no answer.  Reached out to social work to assist in determination of safe safe disposition. Lab work pending at this time. If the facility is able to continue caring for her even nonambulatory then family's first wish would be for patient to return to this setting. If they are unable to care for her, escalation of care would be needed per social work.   Emergency Department Medication Summary:   Medications  lidocaine   (PF) (XYLOCAINE ) 1 % injection 10 mL (has no administration in time range)           Clinical Impression:  1. Fall, initial encounter   2. Closed fracture of right ankle, initial encounter      Data Unavailable   Final Clinical Impression(s) / ED Diagnoses Final diagnoses:  Fall, initial encounter  Closed fracture of right ankle, initial encounter    Rx / DC Orders ED Discharge Orders     None         Jerral Meth, MD 08/18/23 [phone]

## 2023-08-18 NOTE — ED Notes (Signed)
 Assisted ortho doc removing splint form left leg for evaluation. Splint replaced w/ Ortho doc.

## 2023-08-18 NOTE — ED Provider Notes (Signed)
 78 yo female here from assisted living on hospice Here with fall, ankle fracture Family not wanting operation - ortho reporting NWB  Pending TOC consult to determine if patient can return non-ambulatory to her current facility  Physical Exam  BP 129/72   Pulse 90   Temp 98.2 F (36.8 C) (Oral)   Resp 17   Ht 5' 6 (1.676 m)   Wt 90.7 kg   SpO2 93%   BMI 32.28 kg/m   Physical Exam  Procedures  Procedures  ED Course / MDM    Medical Decision Making Amount and/or Complexity of Data Reviewed Labs: ordered. Radiology: ordered.  Risk Prescription drug management.   [facility] team confirmed with director at [facility] that they can manage this patient as NWB on her return.  Daughter updated.  Arranging for PTAR.       Cottie Donnice PARAS, MD 08/18/23 [phone]

## 2023-08-18 NOTE — Anesthesia Postprocedure Evaluation (Signed)
 Anesthesia Post Note  Patient: [name]  Procedure(s) Performed: OPEN REDUCTION INTERNAL FIXATION (ORIF) ANKLE FRACTURE (Right: Ankle)     Patient location during evaluation: PACU Anesthesia Type: General Level of consciousness: awake and alert Pain management: pain level controlled Vital Signs Assessment: post-procedure vital signs reviewed and stable Respiratory status: spontaneous breathing, nonlabored ventilation, respiratory function stable and patient connected to nasal cannula oxygen Cardiovascular status: blood pressure returned to baseline and stable Postop Assessment: no apparent nausea or vomiting Anesthetic complications: no  No notable events documented.  Last Vitals:  Vitals:   08/18/23 1750 08/18/23 1911  BP: 126/67 115/65  Pulse: 90 90  Resp: 16 16  Temp: 37.1 C 36.8 C  SpO2: 97% 94%    Last Pain:  Vitals:   08/18/23 1911  TempSrc: Oral  PainSc:                  [name]

## 2023-08-18 NOTE — Progress Notes (Addendum)
 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 [facility] received a call from facility med tech reporting they would be sending the patient to the hospital due to a fall and patient complaining of right ankle pain. Facility reported the patient was bleeding at the ankle and is complaining of pain 10/10. Patient sent to [facility] Per Dr. Norleen [name] with [facility] this is a related hospital admission.   Visited patient and daughter in ED. Patient reported minor discomfort and feeling very sleepy. Per daughter patient scheduled for surgical debridement of the ankle today at 1pm. Daughter inquired if rails could be added to hospital bed at facility. Informed her that facility does not allow due to it being considered a restraint. Daughter plans to contact facility to see what other options can be made available to keep patient safe. Daughter also had questions regarding if patient would be able to return back to facility. I informed daughter of the process and that it would be up to facility to accept patient back following procedure and patient level of care at that time. Daughter verbalized understanding. No other current questions or concerns.   [name] is inpatient appropriate with need for surgical intervention of right ankle following fall resulting in fracture..    VS: 99.4/89/16    122/72    spO2 94% on ra   I/O: none documented    Abnormal Labs:  08/18/23 06:37 Basic metabolic panel with GFR: Rpt ! Potassium: 2.8 (L) Glucose: 137 (H) Calcium: 8.3 (L) WBC: 10.7 (H) NEUT#: 8.0 (H)   Diagnostics:  DG Ankle 2 Views Right 6.22.25 Narrative & Impression EXAM: 2 VIEW(S) XRAY OF THE RIGHT ANKLE 08/18/2023 06:08:14 AM   CLINICAL HISTORY: Fall.   COMPARISON: None available.    FINDINGS:   BONES AND JOINTS: Comminuted fracture dislocation and an oblique fracture are present in the distal fibula. An avulsion fracture is present in the medial malleolus. The talus is displaced laterally. A nondisplaced posterior tibial fracture is present.   SOFT TISSUES: Extensive surrounding soft tissue swelling is present.   IMPRESSION: 1. Comminuted fracture dislocation of the right ankle with oblique fracture of the distal fibula, avulsion fracture of the medial malleolus, lateral displacement of the talus, and nondisplaced posterior tibial fracture. 2. Extensive surrounding soft tissue swelling.   Electronically signed by: Laurie Necessary MD 08/18/2023 06:23 AM EDT RP Workstation: [id number]    IV/PRN meds: Fentanyl  50mcg IV x1, Ancef  2g IV x1, Oxycodone  10mg  PO x1,      Assessment as per Laurie Grow, MD 6.22.2025   Assessment and Plan: Principal Problem:   Open right ankle fracture   #Fracture of Right Ankle  #Oblique Fracture of Distal Fibula  #Avulsion Fracture of Medial Malleolus  Noted on imaging, pt states she just rolled out of bed and doesn't recall how this had happened. Ortho on board, plan for OR today for repair. Will also discuss pain regimen with them. Per daughter she has a history of being confused after surgery, as last year for a few days she was only AAOx1 when normally she's AAOx4.    #Hypokalemia  Likely in the setting of decreased appetite over the last two weeks or so. Will replenish and repeat RFP in the AM.    #Pancreatic Cancer  Follows with Heme/Onc outpatient, pt  and family have elected for palliative route, and she is currently in hospice care. DNR/DNI confirmed.    #Anxiety/Depression  Holding her home anti-depressant and anxiety medication for now (sertraline  and trazodone ), will restart after surgery.     Discharge Planning:  Ongoing, likely back to facility once medically optimized   Family contact: Spoke with dtr  [name] at bedside.   IDT: updated   Goals of Care: DNR   Should patient need ambulance transport at discharge, please call [facility] as we contract with them for our active hospice patients.    Please call with any hospice related questions or concerns.   [name], BSN, [facility] Liaison [phone]

## 2023-08-18 NOTE — Hospital Course (Addendum)
 Had a fall ,got a fracture , Ortho planning surgery today  Hypokalemic    Fell Friday, fell forward out of her bed one time before that. Rolled out of her bed this morning, just rolled over. In the last week or week and a half, has seen a few changes. Have upped her pain medications. [name] has been feeling more tired, for the last couple weeks. Diagnosis of cancer was last July, incidental, isolated in the pancreas. For the last 10 months has been ok, pain in the back has been getting worse.    Medications:  Gabapentin 100mg  TID  Senna 8.6mg  daily  Sertraline  50mg   Trazodone  50mg   Meloxicam 7.5mg   Oxycodone  5mg  TID  Oxycodone  5mg  for breakthrough pain   PMH:  Bilateral primary osteoarthritis of hip  Chronic Pain Syndrome  Polyneuropathy  Insomia  GERD  Vascular dementia    Ok Not oriented

## 2023-08-18 NOTE — Transfer of Care (Signed)
 Immediate Anesthesia Transfer of Care Note  Patient: [name]  Procedure(s) Performed: OPEN REDUCTION INTERNAL FIXATION (ORIF) ANKLE FRACTURE (Right: Ankle)  Patient Location: PACU  Anesthesia Type:GA combined with regional for post-op pain  Level of Consciousness: awake and alert   Airway & Oxygen Therapy: Patient Spontanous Breathing and Patient connected to nasal cannula oxygen  Post-op Assessment: Report given to RN and Post -op Vital signs reviewed and stable  Post vital signs: Reviewed and stable  Last Vitals:  Vitals Value Taken Time  BP 113/62 08/18/23 17:11  Temp    Pulse 93 08/18/23 17:13  Resp 19 08/18/23 17:13  SpO2 92 % 08/18/23 17:13  Vitals shown include unfiled device data.  Last Pain:  Vitals:   08/18/23 1248  TempSrc: Oral  PainSc:          Complications: No notable events documented.

## 2023-08-18 NOTE — Consult Note (Signed)
 Orthopaedic Trauma Service (OTS) Consultation   Patient ID: [name] MRN: [id number] DOB/AGE: Oct 01, 1945 78 y.o.   Reason for Consult: open right ankle fracture Referring Physician: Cyndee Dada, MD  HPI: [name] [place] is an 78 y.o. female who sustained unwitnessed fall. She has been ambulatory but increasingly disoriented and poor of balance. She has endstage pancreatic cancer and has been placed on hospice. Patient reports pain but denies sensory or motor loss.  Past Medical History:  Diagnosis Date   ALCOHOL ABUSE, HX OF    ANEMIA-NOS    ANXIETY    CERVICAL RADICULOPATHY, LEFT    5 BACK OPERATIONS AND 2 NECK FUSIONS   DEGENERATIVE DISC DISEASE, CERVICAL SPINE    DEGENERATIVE DISC DISEASE, CERVICAL SPINE, W/RADICULOPATHY    Depressive disorder, not elsewhere classified    Depressive type psychosis    DIVERTICULITIS, HX OF    CLEARED ACCORDING TO PATIENT   DIZZINESS    GERD    HYPERLIPIDEMIA    HYPERTENSION    Insomnia, unspecified    LOW BACK PAIN    RUPTURED DISC   Memory loss    MIGRAINE HEADACHE 09/06/2017   NOT CURRENTLY   Palpitation    NOT A PROBLEM PER PATIENT   WEIGHT LOSS     Past Surgical History:  Procedure Laterality Date   BACK SURGERY  2008   X 5.Used her bone out of hips.   BILIARY STENT PLACEMENT N/A 09/27/2022   Procedure: BILIARY STENT PLACEMENT;  Surgeon: Wilhelmenia Aloha Raddle., MD;  Location: [facility];  Service: Gastroenterology;  Laterality: N/A;   BIOPSY  09/11/2022   Procedure: BIOPSY;  Surgeon: Wilhelmenia Aloha Raddle., MD;  Location: [facility];  Service: Gastroenterology;;   CERVICAL FUSION     X 2, [place], [place], [place]   DILATION AND CURETTAGE OF UTERUS     ENDOSCOPIC RETROGRADE CHOLANGIOPANCREATOGRAPHY (ERCP) WITH PROPOFOL  N/A 09/27/2022   Procedure: ENDOSCOPIC RETROGRADE CHOLANGIOPANCREATOGRAPHY (ERCP) WITH PROPOFOL ;  Surgeon: Wilhelmenia Aloha Raddle., MD;  Location: [facility];  Service: Gastroenterology;   Laterality: N/A;   ESOPHAGOGASTRODUODENOSCOPY N/A 01/03/2015   Procedure: ESOPHAGOGASTRODUODENOSCOPY (EGD);  Surgeon: Donnice Vaughn Manes, MD;  Location: [facility];  Service: Endoscopy;  Laterality: N/A;   ESOPHAGOGASTRODUODENOSCOPY N/A 09/11/2022   Procedure: ESOPHAGOGASTRODUODENOSCOPY (EGD);  Surgeon: Wilhelmenia Aloha Raddle., MD;  Location: [facility];  Service: Gastroenterology;  Laterality: N/A;   EYE SURGERY Bilateral    Cataract Extraction with IOL   FINE NEEDLE ASPIRATION N/A 09/11/2022   Procedure: FINE NEEDLE ASPIRATION (FNA) LINEAR;  Surgeon: Wilhelmenia Aloha Raddle., MD;  Location: [facility];  Service: Gastroenterology;  Laterality: N/A;   HYSTEROSCOPY WITH D & C N/A 11/05/2016   Procedure: DILATATION AND CURETTAGE /HYSTEROSCOPY/MYOSURE RESECTION OF ENDOMETRIAL POLYP;  Surgeon: Schermerhorn, Debby PARAS, MD;  Location: [facility];  Service: Gynecology;  Laterality: N/A;   LUMBAR FUSION     PANCREATIC STENT PLACEMENT  09/27/2022   Procedure: PANCREATIC STENT PLACEMENT;  Surgeon: Wilhelmenia Aloha Raddle., MD;  Location: [facility];  Service: Gastroenterology;;   [name]  09/27/2022   Procedure: [name];  Surgeon: Wilhelmenia Aloha Raddle., MD;  Location: [facility];  Service: Gastroenterology;;   TONSILLECTOMY     TOTAL HIP ARTHROPLASTY Right 08/04/2015   Procedure: TOTAL HIP ARTHROPLASTY ANTERIOR APPROACH;  Surgeon: Ozell Flake, MD;  Location: [facility];  Service: Orthopedics;  Laterality: Right;   TOTAL HIP ARTHROPLASTY Left 09/10/2017   Procedure: TOTAL HIP ARTHROPLASTY ANTERIOR APPROACH;  Surgeon: Flake Ozell, MD;  Location: [facility];  Service: Orthopedics;  Laterality: Left;   UPPER ESOPHAGEAL ENDOSCOPIC ULTRASOUND (EUS) N/A 09/11/2022   Procedure: UPPER ESOPHAGEAL ENDOSCOPIC ULTRASOUND (EUS);  Surgeon: Wilhelmenia Aloha Raddle., MD;  Location: [facility];  Service: Gastroenterology;  Laterality: N/A;    Family History  Problem Relation Age of Onset   Alcohol abuse  Father    Depression Other    Alcohol abuse Other     Social History:  reports that she has been smoking cigarettes. She has never used smokeless tobacco. She reports that she does not drink alcohol and does not use drugs.  Allergies:  Allergies  Allergen Reactions   Codeine Itching   Atorvastatin Other (See Comments)    Unknown; not listed on MAR   Lisinopril  Other (See Comments)    Angioedema   Aspirin  Other (See Comments)    Reaction: stomach ache   Morphine Other (See Comments)    severe headache   Nsaids Other (See Comments)    STOMACH PAINS    Medications: I have reviewed the patient's current medications.  Results for orders placed or performed during the hospital encounter of 08/18/23 (from the past 48 hours)  CBC with Differential     Status: Abnormal   Collection Time: 08/18/23  6:37 AM  Result Value Ref Range   WBC 10.7 (H) 4.0 - 10.5 K/uL   RBC 4.26 3.87 - 5.11 MIL/uL   Hemoglobin 12.7 12.0 - 15.0 g/dL   HCT 62.1 63.9 - 53.9 %   MCV 88.7 80.0 - 100.0 fL   MCH 29.8 26.0 - 34.0 pg   MCHC 33.6 30.0 - 36.0 g/dL   RDW 86.4 88.4 - 84.4 %   Platelets 273 150 - 400 K/uL   nRBC 0.0 0.0 - 0.2 %   Neutrophils Relative % 74 %   Neutro Abs 8.0 (H) 1.7 - 7.7 K/uL   Lymphocytes Relative 18 %   Lymphs Abs 1.9 0.7 - 4.0 K/uL   Monocytes Relative 6 %   Monocytes Absolute 0.6 0.1 - 1.0 K/uL   Eosinophils Relative 0 %   Eosinophils Absolute 0.0 0.0 - 0.5 K/uL   Basophils Relative 1 %   Basophils Absolute 0.1 0.0 - 0.1 K/uL   Immature Granulocytes 1 %   Abs Immature Granulocytes 0.06 0.00 - 0.07 K/uL    Comment: Performed at [facility], [address], [place], [place] [address]  Basic metabolic panel     Status: Abnormal   Collection Time: 08/18/23  6:37 AM  Result Value Ref Range   Sodium 137 135 - 145 mmol/L   Potassium 2.8 (L) 3.5 - 5.1 mmol/L   Chloride 98 98 - 111 mmol/L   CO2 30 22 - 32 mmol/L   Glucose, Bld 137 (H) 70 - 99 mg/dL    Comment: Glucose  reference range applies only to samples taken after fasting for at least 8 hours.   BUN 10 8 - 23 mg/dL   Creatinine, Ser 9.17 0.44 - 1.00 mg/dL   Calcium 8.3 (L) 8.9 - 10.3 mg/dL   GFR, Estimated >39 >39 mL/min    Comment: (NOTE) Calculated using the CKD-EPI Creatinine Equation (2021)    Anion gap 9 5 - 15    Comment: Performed at [facility], [address] [place], [place] [address]    DG Ankle 2 Views Right Result Date: 08/18/2023 EXAM: 2 VIEW(S) XRAY OF THE RIGHT ANKLE 08/18/2023 06:08:14 AM CLINICAL HISTORY: Fall. COMPARISON: None available.  FINDINGS: BONES AND JOINTS: Comminuted fracture dislocation and an oblique fracture are present in the distal fibula. An avulsion fracture is present in the medial malleolus. The talus is displaced laterally. A nondisplaced posterior tibial fracture is present. SOFT TISSUES: Extensive surrounding soft tissue swelling is present. IMPRESSION: 1. Comminuted fracture dislocation of the right ankle with oblique fracture of the distal fibula, avulsion fracture of the medial malleolus, lateral displacement of the talus, and nondisplaced posterior tibial fracture. 2. Extensive surrounding soft tissue swelling. Electronically signed by: Lonni Necessary MD 08/18/2023 06:23 AM EDT RP Workstation: [id number]    Intake/Output    None      ROS significant mental decline last few weeks per daughter; No recent fever, bleeding abnormalities, urologic dysfunction, GI problems, or weight loss per daughter.  Blood pressure 109/77, pulse 95, temperature 98.1 F (36.7 C), temperature source Oral, resp. rate 14, height 5' 6 (1.676 m), weight 90.7 kg, SpO2 95%. Physical Exam NCAT, disoriented but pleasant Abd soft, obese RRR No audible wheezing or chest wall retractions with respiration RLE Splint taken down 4 cm bleeding medial wound with staples in it Swelling mild only Sens: DPN, SPN, TN intact  Motor: EHL, FHL, and lessor toe ext and flex all  intact grossly  DP palp, Brisk cap refill, warm to touch Gait: could not assess Coordination and balance: could not assess   Assessment/Plan:  Open type 2 right ankle bimalleolar fracture Endstage pancreatic cancer on hospice  The risks and benefits of debridement and repair were discussed with the patient's daughter, including the possibility of heart attack, stroke, failure to prevent wound infection, nerve injury, vessel injury, wound breakdown, arthritis, symptomatic hardware, DVT/ PE, loss of motion, malunion, nonunion, and need for further surgery among others.  We also specifically discussed that our primary goal was to prevent infection and the associated pain/ illness that could accompany that. These risks were acknowledged and consent was provided to proceed.  Weightbearing: NWB RLE Insicional and dressing care: Dressings left intact until follow-up Orthopedic device(s): walker Showering: yes, please, but keep splint dry VTE prophylaxis: Lovenox  40mg  qd 4 weeks Pain control: Norco Follow - up plan: 2 weeks Contact information:  Ozell Bruch, MD, [place], PA-C   Ozell Bruch, MD [facility], [facility] [phone]  08/18/2023, 11:30 AM  [facility] [address] [place] [address] [phone] (F)    After 5pm and on the weekends please log on to Amion, go to orthopaedics and the look under the [facility] Call for the provider(s) on call. You can also call our office at [phone] and then follow the prompts to be connected to the call team.

## 2023-08-18 NOTE — Anesthesia Procedure Notes (Signed)
 Procedure Name: LMA Insertion Date/Time: 08/18/2023 2:37 PM  Performed by: Tressie Gilmore RAMAN, CRNAPre-anesthesia Checklist: Patient identified, Emergency Drugs available, Suction available and Patient being monitored Patient Re-evaluated:Patient Re-evaluated prior to induction Oxygen Delivery Method: Circle System Utilized Preoxygenation: Pre-oxygenation with 100% oxygen Induction Type: IV induction Ventilation: Mask ventilation without difficulty LMA: LMA inserted LMA Size: 4.0 Number of attempts: 1 Airway Equipment and Method: Bite block Placement Confirmation: positive ETCO2 Tube secured with: Tape Dental Injury: Teeth and Oropharynx as per pre-operative assessment

## 2023-08-18 NOTE — Progress Notes (Signed)
 Patient is from [facility], which is an assisted living facility.  CSW spoke with Heron at [facility] who states patient can return to the facility today. Heron states patient has a hospital bed in her room already. Heron states patient will have to use incontinence supplies in order to remain nonweightbearing. Heron states report can be called to the main number @ [phone].  CSW notified RN and MD of information.  Niels Portugal, MSW, LCSW Transitions of Care  Clinical Social Worker II [phone]

## 2023-08-18 NOTE — Progress Notes (Signed)
 Orthopedic Tech Progress Note Patient Details:  [name] [birth year] [id number]  Ortho Devices Type of Ortho Device: Post (short leg) splint, Stirrup splint Ortho Device/Splint Location: rle Ortho Device/Splint Interventions: Ordered, Application, Adjustment   Post Interventions Patient Tolerated: Well Instructions Provided: Care of device, Adjustment of device  Chandra Dorn PARAS 08/18/2023, 7:31 AM

## 2023-08-18 NOTE — Anesthesia Procedure Notes (Signed)
 Anesthesia Regional Block: Adductor canal block   Pre-Anesthetic Checklist: , timeout performed,  Correct Patient, Correct Site, Correct Laterality,  Correct Procedure, Correct Position, site marked,  Risks and benefits discussed,  Surgical consent,  Pre-op evaluation,  At surgeon's request and post-op pain management  Laterality: Right  Prep: chloraprep       Needles:  Injection technique: Single-shot  Needle Type: Echogenic Needle     Needle Length: 9cm  Needle Gauge: 21     Additional Needles:   Procedures:,,,, ultrasound used (permanent image in chart),,    Narrative:  Start time: 08/18/2023 1:40 PM End time: 08/18/2023 1:45 PM Injection made incrementally with aspirations every 5 mL.  Performed by: Personally  Anesthesiologist: Epifanio Charleston, MD

## 2023-08-18 NOTE — Discharge Instructions (Addendum)
 Orthopaedic discharge instructions   Nonweightbearing right leg Do not remove splint Keep leg elevated to help with swelling  Call orthopaedic office with questions at [phone]

## 2023-08-18 NOTE — Anesthesia Preprocedure Evaluation (Addendum)
 Anesthesia Evaluation  Patient identified by MRN, date of birth, ID band Patient awake and Patient confused    Reviewed: Allergy & Precautions, NPO status , Patient's Chart, lab work & pertinent test results  Airway Mallampati: II  TM Distance: >3 FB Neck ROM: Full    Dental  (+) Dental Advisory Given   Pulmonary Current Smoker   breath sounds clear to auscultation       Cardiovascular hypertension, Pt. on medications  Rhythm:Regular Rate:Normal     Neuro/Psych  Headaches  Neuromuscular disease    GI/Hepatic ,GERD  ,,Pancreatic ca   Endo/Other  negative endocrine ROS    Renal/GU negative Renal ROS     Musculoskeletal  (+) Arthritis ,    Abdominal   Peds  Hematology  (+) Blood dyscrasia, anemia   Anesthesia Other Findings   Reproductive/Obstetrics                             Anesthesia Physical Anesthesia Plan  ASA: 3  Anesthesia Plan: General   Post-op Pain Management: Regional block* and Ofirmev  IV (intra-op)*   Induction: Intravenous  PONV Risk Score and Plan: 2 and Dexamethasone , Ondansetron , Treatment may vary due to age or medical condition and TIVA  Airway Management Planned: LMA  Additional Equipment:   Intra-op Plan:   Post-operative Plan: Extubation in OR  Informed Consent: I have reviewed the patients History and Physical, chart, labs and discussed the procedure including the risks, benefits and alternatives for the proposed anesthesia with the patient or authorized representative who has indicated his/her understanding and acceptance.     Dental advisory given  Plan Discussed with: CRNA  Anesthesia Plan Comments:        Anesthesia Quick Evaluation

## 2023-08-18 NOTE — ED Notes (Signed)
 [facility] called to take patient back to [facility].

## 2023-08-18 NOTE — ED Provider Notes (Addendum)
 Dr Celena saw patient and spoke to family and now planning for OR today.  We'll admit to the hospital for overnight observation as she has a known history of confusion following anesthesia.  Admitted to IM service 940 am   Laurie Donnice PARAS, MD 08/18/23 9070    Laurie Donnice PARAS, MD 08/18/23 [phone]

## 2023-08-18 NOTE — Anesthesia Procedure Notes (Addendum)
 Anesthesia Regional Block: Popliteal block   Pre-Anesthetic Checklist: , timeout performed,  Correct Patient, Correct Site, Correct Laterality,  Correct Procedure, Correct Position, site marked,  Risks and benefits discussed,  Surgical consent,  Pre-op evaluation,  At surgeon's request and post-op pain management  Laterality: Right  Prep: chloraprep       Needles:  Injection technique: Single-shot  Needle Type: Echogenic Needle     Needle Length: 9cm  Needle Gauge: 21     Additional Needles:   Procedures:,,,, ultrasound used (permanent image in chart),,    Narrative:  Start time: 08/18/2023 1:33 PM End time: 08/18/2023 1:40 PM Injection made incrementally with aspirations every 5 mL.  Performed by: Personally  Anesthesiologist: Epifanio Charleston, MD

## 2023-08-19 DIAGNOSIS — Z961 Presence of intraocular lens: Secondary | ICD-10-CM | POA: Diagnosis present

## 2023-08-19 DIAGNOSIS — Z888 Allergy status to other drugs, medicaments and biological substances status: Secondary | ICD-10-CM | POA: Diagnosis not present

## 2023-08-19 DIAGNOSIS — C250 Malignant neoplasm of head of pancreas: Secondary | ICD-10-CM | POA: Diagnosis not present

## 2023-08-19 DIAGNOSIS — S82891A Other fracture of right lower leg, initial encounter for closed fracture: Secondary | ICD-10-CM

## 2023-08-19 DIAGNOSIS — R410 Disorientation, unspecified: Secondary | ICD-10-CM | POA: Diagnosis not present

## 2023-08-19 DIAGNOSIS — F05 Delirium due to known physiological condition: Secondary | ICD-10-CM | POA: Diagnosis present

## 2023-08-19 DIAGNOSIS — Z66 Do not resuscitate: Secondary | ICD-10-CM | POA: Diagnosis present

## 2023-08-19 DIAGNOSIS — Z8507 Personal history of malignant neoplasm of pancreas: Secondary | ICD-10-CM | POA: Diagnosis not present

## 2023-08-19 DIAGNOSIS — I10 Essential (primary) hypertension: Secondary | ICD-10-CM | POA: Diagnosis present

## 2023-08-19 DIAGNOSIS — E876 Hypokalemia: Secondary | ICD-10-CM

## 2023-08-19 DIAGNOSIS — Z515 Encounter for palliative care: Secondary | ICD-10-CM | POA: Diagnosis not present

## 2023-08-19 DIAGNOSIS — Z885 Allergy status to narcotic agent status: Secondary | ICD-10-CM | POA: Diagnosis not present

## 2023-08-19 DIAGNOSIS — Z9841 Cataract extraction status, right eye: Secondary | ICD-10-CM | POA: Diagnosis not present

## 2023-08-19 DIAGNOSIS — Z981 Arthrodesis status: Secondary | ICD-10-CM | POA: Diagnosis not present

## 2023-08-19 DIAGNOSIS — W06XXXA Fall from bed, initial encounter: Secondary | ICD-10-CM | POA: Diagnosis present

## 2023-08-19 DIAGNOSIS — W19XXXA Unspecified fall, initial encounter: Secondary | ICD-10-CM | POA: Diagnosis not present

## 2023-08-19 DIAGNOSIS — Z818 Family history of other mental and behavioral disorders: Secondary | ICD-10-CM | POA: Diagnosis not present

## 2023-08-19 DIAGNOSIS — F1021 Alcohol dependence, in remission: Secondary | ICD-10-CM | POA: Diagnosis present

## 2023-08-19 DIAGNOSIS — Z9842 Cataract extraction status, left eye: Secondary | ICD-10-CM | POA: Diagnosis not present

## 2023-08-19 DIAGNOSIS — F17210 Nicotine dependence, cigarettes, uncomplicated: Secondary | ICD-10-CM | POA: Diagnosis present

## 2023-08-19 DIAGNOSIS — F419 Anxiety disorder, unspecified: Secondary | ICD-10-CM | POA: Diagnosis present

## 2023-08-19 DIAGNOSIS — Z96643 Presence of artificial hip joint, bilateral: Secondary | ICD-10-CM | POA: Diagnosis present

## 2023-08-19 DIAGNOSIS — E785 Hyperlipidemia, unspecified: Secondary | ICD-10-CM | POA: Diagnosis present

## 2023-08-19 DIAGNOSIS — F32A Depression, unspecified: Secondary | ICD-10-CM | POA: Diagnosis present

## 2023-08-19 DIAGNOSIS — S82891B Other fracture of right lower leg, initial encounter for open fracture type I or II: Secondary | ICD-10-CM | POA: Diagnosis not present

## 2023-08-19 DIAGNOSIS — S82841B Displaced bimalleolar fracture of right lower leg, initial encounter for open fracture type I or II: Secondary | ICD-10-CM | POA: Diagnosis present

## 2023-08-19 DIAGNOSIS — Z811 Family history of alcohol abuse and dependence: Secondary | ICD-10-CM | POA: Diagnosis not present

## 2023-08-19 DIAGNOSIS — R451 Restlessness and agitation: Secondary | ICD-10-CM | POA: Diagnosis present

## 2023-08-19 DIAGNOSIS — Z23 Encounter for immunization: Secondary | ICD-10-CM | POA: Diagnosis present

## 2023-08-19 DIAGNOSIS — Z886 Allergy status to analgesic agent status: Secondary | ICD-10-CM | POA: Diagnosis not present

## 2023-08-19 LAB — MAGNESIUM: Magnesium: 1.6 mg/dL — ABNORMAL LOW (ref 1.7–2.4)

## 2023-08-19 LAB — BASIC METABOLIC PANEL WITH GFR
Anion gap: 15 (ref 5–15)
Anion gap: 20 — ABNORMAL HIGH (ref 5–15)
BUN: 6 mg/dL — ABNORMAL LOW (ref 8–23)
BUN: 7 mg/dL — ABNORMAL LOW (ref 8–23)
CO2: 26 mmol/L (ref 22–32)
CO2: 23 mmol/L (ref 22–32)
Calcium: 8.2 mg/dL — ABNORMAL LOW (ref 8.9–10.3)
Calcium: 8.7 mg/dL — ABNORMAL LOW (ref 8.9–10.3)
Chloride: 99 mmol/L (ref 98–111)
Chloride: 96 mmol/L — ABNORMAL LOW (ref 98–111)
Creatinine, Ser: 0.71 mg/dL (ref 0.44–1.00)
Creatinine, Ser: 0.69 mg/dL (ref 0.44–1.00)
GFR, Estimated: >60 mL/min (ref >60)
GFR, Estimated: >60 mL/min (ref >60)
Glucose, Bld: 153 mg/dL — ABNORMAL HIGH (ref 70–99)
Glucose, Bld: 131 mg/dL — ABNORMAL HIGH (ref 70–99)
Potassium: 2.7 mmol/L — CL (ref 3.5–5.1)
Potassium: 2.6 mmol/L — CL (ref 3.5–5.1)
Sodium: 140 mmol/L (ref 135–145)
Sodium: 139 mmol/L (ref 135–145)

## 2023-08-19 LAB — RENAL FUNCTION PANEL
Albumin: 2.9 g/dL — ABNORMAL LOW (ref 3.5–5.0)
Anion gap: 15 (ref 5–15)
BUN: 6 mg/dL — ABNORMAL LOW (ref 8–23)
CO2: 27 mmol/L (ref 22–32)
Calcium: 8.6 mg/dL — ABNORMAL LOW (ref 8.9–10.3)
Chloride: 97 mmol/L — ABNORMAL LOW (ref 98–111)
Creatinine, Ser: 0.66 mg/dL (ref 0.44–1.00)
GFR, Estimated: >60 mL/min (ref >60)
Glucose, Bld: 132 mg/dL — ABNORMAL HIGH (ref 70–99)
Phosphorus: 1.4 mg/dL — ABNORMAL LOW (ref 2.5–4.6)
Potassium: 2.6 mmol/L — CL (ref 3.5–5.1)
Sodium: 139 mmol/L (ref 135–145)

## 2023-08-19 LAB — GLUCOSE, CAPILLARY: Glucose-Capillary: 129 mg/dL — ABNORMAL HIGH (ref 70–99)

## 2023-08-19 LAB — CBC
HCT: 33.3 % — ABNORMAL LOW (ref 36.0–46.0)
Hemoglobin: 11.2 g/dL — ABNORMAL LOW (ref 12.0–15.0)
MCH: 29.9 pg (ref 26.0–34.0)
MCHC: 33.6 g/dL (ref 30.0–36.0)
MCV: 89 fL (ref 80.0–100.0)
Platelets: 239 10*3/uL (ref 150–400)
RBC: 3.74 MIL/uL — ABNORMAL LOW (ref 3.87–5.11)
RDW: 13.5 % (ref 11.5–15.5)
WBC: 8.2 10*3/uL (ref 4.0–10.5)
nRBC: 0 % (ref 0.0–0.2)

## 2023-08-19 MED ORDER — POTASSIUM CHLORIDE 10 MEQ/100ML IV SOLN
10.0000 meq | INTRAVENOUS | Status: AC
  Administered 2023-08-19 (×3): 10 meq via INTRAVENOUS
  Filled 2023-08-19 (×2): qty 100

## 2023-08-19 MED ORDER — POTASSIUM CHLORIDE 10 MEQ/100ML IV SOLN
10.0000 meq | INTRAVENOUS | Status: AC
  Administered 2023-08-19 – 2023-08-20 (×6): 10 meq via INTRAVENOUS
  Filled 2023-08-19 (×6): qty 100

## 2023-08-19 MED ORDER — MAGNESIUM SULFATE 4 GM/100ML IV SOLN
4.0000 g | Freq: Once | INTRAVENOUS | Status: AC
  Administered 2023-08-19: 4 g via INTRAVENOUS
  Filled 2023-08-19: qty 100

## 2023-08-19 MED ORDER — OXYCODONE HCL 5 MG PO TABS: 10.0000 mg | ORAL_TABLET | Freq: Three times a day (TID) | ORAL | Status: DC

## 2023-08-19 MED ORDER — POTASSIUM CHLORIDE 20 MEQ PO PACK
40.0000 meq | PACK | Freq: Two times a day (BID) | ORAL | Status: DC
  Filled 2023-08-19: qty 2

## 2023-08-19 NOTE — Progress Notes (Signed)
 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 [facility] received a call from facility med tech reporting they would be sending the patient to the hospital due to a fall and patient complaining of right ankle pain. Facility reported the patient was bleeding at the ankle and is complaining of pain 10/10. Patient sent to [facility]. Per Dr. Norleen Laurence with [facility] this is a related hospital admission.   Visited patient at bedside. She was confused, disoriented and not able to articulate anything other than "okay, okay." Report exchanged with floor nurse who advised that patient has remained confused post-surgery and has attempted to get out of bed multiple times. No family at bedside during visit. Phone call attempt made to patient's daughter.    [name] is inpatient appropriate with need for surgical intervention of right ankle following fall resulting in fracture.   VS: 98.8/90/16    118/66    spO2 96% on RA   I/O: 404.1/not documented    Abnormal Labs:  08/18/23 06:37 Potassium: 2.8 (L) Glucose: 137 (H) Calcium: 8.3 (L) WBC: 10.7 (H) NEUT#: 8.0 (H)   Diagnostics: none new   IV/PRN meds: Ancef  2g IV x2, Oxycodone  10mg  PO x2, Dilaudid  0.5mg  IV x1     Assessment as per Ozell Bruch, MD 6.22.25: Assessment/Plan:   Open type 2 right ankle bimalleolar fracture Endstage pancreatic cancer on hospice   The risks and benefits of debridement and repair were discussed with the patient's daughter, including the possibility of heart attack, stroke, failure to prevent wound infection, nerve injury, vessel injury, wound breakdown, arthritis, symptomatic hardware, DVT/ PE, loss of motion, malunion, nonunion, and need for further surgery among others.  We also specifically discussed that our  primary goal was to prevent infection and the associated pain/ illness that could accompany that. These risks were acknowledged and consent was provided to proceed.   Weightbearing: NWB RLE Insicional and dressing care: Dressings left intact until follow-up Orthopedic device(s): walker Showering: yes, please, but keep splint dry VTE prophylaxis: Lovenox  40mg  qd 4 weeks Pain control: Norco Discharge Planning:  Ongoing, likely back to facility once medically optimized   Family contact: Left voicemail message for daughter.   IDT: updated   Goals of Care: DNR   Should patient need ambulance transport at discharge, please call [facility] as we contract with them for our active hospice patients.    Please call with any hospice related questions or concerns.   Eleanor Nail, LPN [facility] Liaison [phone]

## 2023-08-19 NOTE — Evaluation (Signed)
 Occupational Therapy Evaluation Patient Details Name: [name] MRN: [id number] DOB: [birth year] Today's Date: 08/19/2023   History of Present Illness   [name] is a 78 yo female who had an unwitnessed fall out of bed. Found to have multiple R ankle fractures. ORIF 6/22. PMHx: end stage pancreatic cancer, on hospice, anxiety, HLD, HTN, memory loss     Clinical Impressions [name] was evaluated s/p the above admission list. Per chart review she resides at ALF, unsure of functional baseline or home set up. Upon evaluation the pt was limited by impaired cognition, RLE pain, RLE NWB and AMS. On arrival, pt was attempted to get OOB with BLE over the bed rail and bed alarm sounding. She was redirectable and needed min A+2 to get back into bed safely. Overall she will currently require total A +2 for all aspects of [name] care and OOB attempts. Hopefully her functional level improves once her cognition clears. Pt will benefit from continued acute OT services and skilled inpatient follow up therapy, <3 hours/day.      If plan is discharge home, recommend the following:   A lot of help with walking and/or transfers;Two people to help with walking and/or transfers;A lot of help with bathing/dressing/bathroom;Two people to help with bathing/dressing/bathroom;Assistance with cooking/housework;Assistance with feeding;Direct supervision/assist for medications management;Direct supervision/assist for financial management;Assist for transportation;Supervision due to cognitive status;Help with stairs or ramp for entrance     Functional Status Assessment   Patient has had a recent decline in their functional status and demonstrates the ability to make significant improvements in function in a reasonable and predictable amount of time.     Equipment Recommendations   Other (comment) (pending)      Precautions/Restrictions   Precautions Precautions: Fall Recall of Precautions/Restrictions:  Impaired Required Braces or Orthoses: Splint/Cast Splint/Cast: long leg splint applied in OR Splint/Cast - Date Prophylactic Dressing Applied (if applicable): 08/18/23 Restrictions Weight Bearing Restrictions Per Provider Order: Yes RLE Weight Bearing Per Provider Order: Non weight bearing     Mobility Bed Mobility Overal bed mobility: Needs Assistance Bed Mobility: Rolling, Supine to Sit, Sit to Supine Rolling: Supervision   Supine to sit: Contact guard Sit to supine: Min assist, +2 for physical assistance, +2 for safety/equipment   General bed mobility comments: pt attempted to get OOB on arrival with BLEs over the rail, bed alarm sounding. min A+2 to get bed to bed safely, assist needed due to impaired cognition    Transfers                   General transfer comment: defer      Balance Overall balance assessment: Needs assistance Sitting-balance support: No upper extremity supported, Feet unsupported Sitting balance-Leahy Scale: Fair             ADL either performed or assessed with clinical judgement   ADL Overall ADL's : Needs assistance/impaired               General ADL Comments: total A +2 at bed level for all aspects of [place] care.     Vision   Vision Assessment?: No apparent visual deficits (pt held eyes closed most of the session)     Perception Perception: Not tested       Praxis Praxis: Not tested       Pertinent Vitals/Pain Pain Assessment Pain Assessment: No/denies pain     Extremity/Trunk Assessment Upper Extremity Assessment Upper Extremity Assessment: Generalized weakness;Difficult to assess due to impaired cognition  Lower Extremity Assessment Lower Extremity Assessment: Defer to PT evaluation   Cervical / Trunk Assessment Cervical / Trunk Assessment: Normal   Communication Communication Communication: Impaired   Cognition Arousal: Lethargic Behavior During Therapy: Anxious, Impulsive, Restless Cognition: No  family/caregiver present to determine baseline             OT - Cognition Comments: hx of memory loss and AMS post-op. Pt extremely confused, attempting to get OOB on arrival with bed alarm sounding. Pt only stating help-me over and over again.         Following commands: Impaired Following commands impaired: Follows one step commands inconsistently     Cueing  General Comments   Cueing Techniques: Verbal cues  VSS.           Home Living Family/patient expects to be discharged to:: Assisted living                       Additional Comments: Per chart review - [facility] ALF resident      Prior Functioning/Environment Prior Level of Function : Patient poor historian/Family not available             OT Problem List: Decreased strength;Decreased range of motion;Decreased activity tolerance;Impaired balance (sitting and/or standing);Decreased cognition;Decreased safety awareness;Decreased knowledge of precautions;Decreased knowledge of use of DME or AE;Pain   OT Treatment/Interventions: Kuipers-care/ADL training;Therapeutic exercise;DME and/or AE instruction;Therapeutic activities;Balance training;Patient/family education      OT Goals(Current goals can be found in the care plan section)   Acute Rehab OT Goals Patient Stated Goal: help me OT Goal Formulation: Patient unable to participate in goal setting Time For Goal Achievement: 09/02/23 Potential to Achieve Goals: Good ADL Goals Pt Will Perform Grooming: with mod assist;sitting Pt Will Perform Upper Body Dressing: with mod assist;sitting Pt Will Perform Lower Body Dressing: with max assist;sitting/lateral leans Pt Will Transfer to Toilet: with max assist;bedside commode;stand pivot transfer   OT Frequency:  Min 1X/week    Co-evaluation PT/OT/SLP Co-Evaluation/Treatment: Yes Reason for Co-Treatment: Complexity of the patient's impairments (multi-system involvement);For patient/therapist  safety   OT goals addressed during session: ADL's and Pearce-care      AM-PAC OT 6 Clicks Daily Activity     Outcome Measure Help from another person eating meals?: Total Help from another person taking care of personal grooming?: Total Help from another person toileting, which includes using toliet, bedpan, or urinal?: Total Help from another person bathing (including washing, rinsing, drying)?: Total Help from another person to put on and taking off regular upper body clothing?: Total Help from another person to put on and taking off regular lower body clothing?: Total 6 Click Score: 6   End of Session Nurse Communication: Mobility status  Activity Tolerance:   Patient left: in bed;with call bell/phone within reach;with bed alarm set  OT Visit Diagnosis: Unsteadiness on feet (R26.81);Other abnormalities of gait and mobility (R26.89);Muscle weakness (generalized) (M62.81);History of falling (Z91.81);Pain                Time: 9162-9140 OT Time Calculation (min): 22 min Charges:  OT General Charges $OT Visit: 1 Visit OT Evaluation $OT Eval Moderate Complexity: 1 Mod  Lucie Kendall, OTR/L Acute Rehabilitation Services Office [phone] Secure Chat Communication Preferred   Lucie JONETTA Kendall 08/19/2023, 11:05 AM

## 2023-08-19 NOTE — Evaluation (Signed)
 Physical Therapy Evaluation Patient Details Name: [name] MRN: [id number] DOB: Jun 17, 1945 Today's Date: 08/19/2023  History of Present Illness  Tailor Lucking Gwyn is a 78 yo female brought to [facility] 6/22 who had an unwitnessed fall out of bed. Found to have multiple R ankle fractures. S/p ORIF 6/22. PMHx: end stage pancreatic cancer, on hospice, anxiety, HLD, HTN, memory loss.  Clinical Impression  Pt admitted with above diagnosis. Limited evaluation due to confusion and restlessness. Pt only repeating help me and not opening her eyes. Difficulty redirecting, frequently reorienting pt to situation. Attempting to climb out of bed when PT entered room. Able to sit with light support EOB. Needed assistance to scoot back and min assist +2 to help return to supine. Then lethargic, limiting further assessment today. RN present at end of session. Alarm on and fall mats in place. Pt currently with functional limitations due to the deficits listed below (see PT Problem List). Pt will benefit from acute skilled PT to increase their independence and safety with mobility to allow discharge.           If plan is discharge home, recommend the following: Two people to help with walking and/or transfers;Two people to help with bathing/dressing/bathroom;Assist for transportation;Supervision due to cognitive status;Direct supervision/assist for medications management;Direct supervision/assist for financial management;Assistance with cooking/housework   Can travel by private vehicle   No    Equipment Recommendations  (TBD next venue)  Recommendations for Other Services       Functional Status Assessment Patient has had a recent decline in their functional status and demonstrates the ability to make significant improvements in function in a reasonable and predictable amount of time.     Precautions / Restrictions Precautions Precautions: Fall Recall of Precautions/Restrictions: Impaired Required Braces or  Orthoses: Splint/Cast Splint/Cast: long leg splint applied in OR Restrictions Weight Bearing Restrictions Per Provider Order: Yes RLE Weight Bearing Per Provider Order: Non weight bearing      Mobility  Bed Mobility Overal bed mobility: Needs Assistance Bed Mobility: Rolling, Supine to Sit, Sit to Supine Rolling: Supervision   Supine to sit: Contact guard Sit to supine: Min assist, +2 for physical assistance, +2 for safety/equipment   General bed mobility comments: pt attempted to get OOB on arrival with BLEs over the rail, bed alarm sounding. min A+2 to get bed to bed safely, assist needed due to impaired cognition. RLE supported by therapists, Lt hip assisted to scoot back. Frequent cues to open eyes and reorient.    Transfers                   General transfer comment: defer    Ambulation/Gait                  Stairs            Wheelchair Mobility     Tilt Bed    Modified Rankin (Stroke Patients Only)       Balance Overall balance assessment: Needs assistance Sitting-balance support: No upper extremity supported, Feet unsupported Sitting balance-Leahy Scale: Fair                                       Pertinent Vitals/Pain Pain Assessment Pain Assessment: PAINAD Breathing: occasional labored breathing, short period of hyperventilation Negative Vocalization: occasional moan/groan, low speech, negative/disapproving quality Facial Expression: sad, frightened, frown Body Language: tense, distressed pacing, fidgeting Consolability:  distracted or reassured by voice/touch PAINAD Score: 5 Pain Intervention(s): Limited activity within patient's tolerance, Monitored during session, Repositioned    Home Living Family/patient expects to be discharged to:: Assisted living                   Additional Comments: Per chart review - [facility] ALF resident    Prior Function Prior Level of Function : Patient poor  historian/Family not available             Mobility Comments: n/a       Extremity/Trunk Assessment   Upper Extremity Assessment Upper Extremity Assessment: Defer to OT evaluation    Lower Extremity Assessment Lower Extremity Assessment: Difficult to assess due to impaired cognition    Cervical / Trunk Assessment Cervical / Trunk Assessment: Normal  Communication   Communication Communication: Impaired    Cognition Arousal: Lethargic Behavior During Therapy: Anxious, Impulsive, Restless   PT - Cognitive impairments: No family/caregiver present to determine baseline, Difficult to assess Difficult to assess due to: Level of arousal                     PT - Cognition Comments: Keeps eyes closed. only repeating help me throughout session Following commands: Impaired Following commands impaired: Follows one step commands inconsistently     Cueing Cueing Techniques: Verbal cues, Gestural cues, Tactile cues, Visual cues     General Comments General comments (skin integrity, edema, etc.): Tolerated sitting EOB but unaware of deficits, precutions, WB status. Unable to oriented. Difficulty re-directing back into bed to prevent unsafe situation but eventually consoled and returned to supine.    Exercises     Assessment/Plan    PT Assessment Patient needs continued PT services  PT Problem List Decreased strength;Decreased activity tolerance;Decreased range of motion;Decreased mobility;Decreased balance;Decreased coordination;Decreased cognition;Decreased knowledge of use of DME;Decreased safety awareness;Decreased knowledge of precautions;Pain       PT Treatment Interventions DME instruction;Gait training;Functional mobility training;Therapeutic activities;Therapeutic exercise;Balance training;Neuromuscular re-education;Cognitive remediation;Patient/family education;Wheelchair mobility training;Modalities    PT Goals (Current goals can be found in the Care Plan  section)  Acute Rehab PT Goals Patient Stated Goal: none stated PT Goal Formulation: Patient unable to participate in goal setting Time For Goal Achievement: 09/02/23 Potential to Achieve Goals: Fair    Frequency Min 2X/week     Co-evaluation PT/OT/SLP Co-Evaluation/Treatment: Yes Reason for Co-Treatment: Complexity of the patient's impairments (multi-system involvement);For patient/therapist safety;Necessary to address cognition/behavior during functional activity PT goals addressed during session: Mobility/safety with mobility;Balance OT goals addressed during session: ADL's and Welke-care       AM-PAC PT 6 Clicks Mobility  Outcome Measure Help needed turning from your back to your side while in a flat bed without using bedrails?: A Little Help needed moving from lying on your back to sitting on the side of a flat bed without using bedrails?: A Little Help needed moving to and from a bed to a chair (including a wheelchair)?: Total Help needed standing up from a chair using your arms (e.g., wheelchair or bedside chair)?: Total Help needed to walk in hospital room?: Total Help needed climbing 3-5 steps with a railing? : Total 6 Click Score: 10    End of Session   Activity Tolerance: Other (comment) (Limited by confusion/restlessness) Patient left: in bed;with call bell/phone within reach;with bed alarm set;with nursing/sitter in room (Fall mats in place) Nurse Communication: Mobility status;Weight bearing status;Precautions PT Visit Diagnosis: Other abnormalities of gait and mobility (R26.89);Muscle weakness (generalized) (  M62.81);History of falling (Z91.81);Difficulty in walking, not elsewhere classified (R26.2);Other symptoms and signs involving the nervous system (R29.898);Pain Pain - Right/Left: Right Pain - part of body: Ankle and joints of foot    Time: 9153-9144 PT Time Calculation (min) (ACUTE ONLY): 9 min   Charges:   PT Evaluation $PT Eval Moderate Complexity: 1  Mod   PT General Charges $$ ACUTE PT VISIT: 1 Visit         Leontine Roads, PT, DPT [facility]  Rehabilitation Services Physical Therapist Office: [phone] Website: [email or web address]   Leontine GORMAN Roads 08/19/2023, 12:25 PM

## 2023-08-19 NOTE — Progress Notes (Signed)
 Eben, MD notified of critical potassium of 2.7 reported at [facility]

## 2023-08-19 NOTE — Care Management Obs Status (Signed)
 MEDICARE OBSERVATION STATUS NOTIFICATION   Patient Details  Name: [name] MRN: [id number] Date of Birth: 06-20-1945   Medicare Observation Status Notification Given:  Yes    Jon Cruel 08/19/2023, 11:40 AM

## 2023-08-19 NOTE — TOC CAGE-AID Note (Signed)
 Transition of Care [facility]) - CAGE-AID Screening   Patient Details  Name: [name] MRN: [id number] Date of Birth: Apr 15, 1945  Transition of Care [facility]) CM/SW Contact:    Treshawn Allen E Tayelor Osborne, LCSW Phone Number: 08/19/2023, 9:16 AM   Clinical Narrative:    CAGE-AID Screening:    Have You Ever Felt You Ought to Cut Down on Your Drinking or Drug Use?: No Have People Annoyed You By [employer] Your Drinking Or Drug Use?: No Have You Felt Bad Or Guilty About Your Drinking Or Drug Use?: No Have You Ever Had a Drink or Used Drugs First Thing In The Morning to Steady Your Nerves or to Get Rid of a Hangover?: No CAGE-AID Score: 0  Substance Abuse Education Offered: No

## 2023-08-19 NOTE — TOC Initial Note (Addendum)
 Transition of Care [facility]) - Initial/Assessment Note    Patient Details  Name: [name] MRN: [id number] Date of Birth: 04/22/1945  Transition of Care [facility]) CM/SW Contact:    Inocente GORMAN Kindle, LCSW Phone Number: 08/19/2023, 4:05 PM  Clinical Narrative:                 4pm-CSW sent message to [facility] with [facility] memory care that patient may be ready for discharge tomorrow. CSW inquiring with [facility] if patient will require any additional DME.   CSW left voicemail for patient's daughter, [name].   4:16pm-Barbara returned call and confirmed patient is able to return with [facility]. She stated patient has a hospital bed already and should not require any additional DME at this time.   4:34 PM-Patient's daughter returned call and CSW provided update.   Expected Discharge Plan: Memory Care Barriers to Discharge: Continued Medical Work up   Patient Goals and CMS Choice Patient states their goals for this hospitalization and ongoing recovery are:: Return to facility CMS Medicare.gov Compare Post Acute Care list provided to:: Patient Represenative (must comment) Choice offered to / list presented to : Adult Children [place] ownership interest in [facility].provided to:: Adult Children    Expected Discharge Plan and Services In-house Referral: Clinical Social Work   Post Acute Care Choice: Resumption of Svcs/PTA Provider, Hospice Living arrangements for the past 2 months: Assisted Living Facility                                      Prior Living Arrangements/Services Living arrangements for the past 2 months: Assisted Living Facility Lives with:: Facility Resident Patient language and need for interpreter reviewed:: Yes Do you feel safe going back to the place where you live?: Yes        Care giver support system in place?: Yes (comment)   Criminal Activity/Legal Involvement Pertinent to Current Situation/Hospitalization: No - Comment as  needed  Activities of Daily Living   ADL Screening (condition at time of admission) Independently performs ADLs?: Yes (appropriate for developmental age) Is the patient deaf or have difficulty hearing?: No Does the patient have difficulty seeing, even when wearing glasses/contacts?: No Does the patient have difficulty concentrating, remembering, or making decisions?: Yes  Permission Sought/Granted Permission sought to share information with : Facility Industrial/product designer granted to share information with : No  Share Information with NAME: [name]  Permission granted to share info w AGENCY: [facility]  Permission granted to share info w Relationship: Daughter  Permission granted to share info w Contact Information: [phone]  Emotional Assessment Appearance:: Appears stated age Attitude/Demeanor/Rapport: Unable to Assess Affect (typically observed): Unable to Assess Orientation: :  (Disoriented x4) Alcohol / Substance Use: Not Applicable Psych Involvement: No (comment)  Admission diagnosis:  Hypokalemia [E87.6] Open right ankle fracture [S82.891B] Closed fracture of right ankle, initial encounter [S82.891A] Fall, initial encounter [W19.XXXA] Patient Active Problem List   Diagnosis Date Noted   Fall 08/19/2023   Hypokalemia 08/19/2023   Closed fracture of right ankle 08/19/2023   Open right ankle fracture 08/18/2023   Genetic testing 10/22/2022   Malignant neoplasm of head of pancreas (HCC) 09/26/2022   Biliary obstruction 09/25/2022   Cancer of head of pancreas (HCC) 09/19/2022   Angioedema 09/09/2022   Anxiety and depression 09/09/2022   Elevated LFTs 09/09/2022   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) 05/19/2020  Neuropathy 05/19/2020   Status post total hip replacement, left 09/10/2017   Chest pain 11/29/2015   Primary osteoarthritis of right hip 08/04/2015   Preventative health care 01/15/2011   WEIGHT LOSS 06/09/2009   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   CERVICAL RADICULOPATHY, LEFT 10/12/2008   DIZZINESS 09/29/2008   DEGENERATIVE DISC DISEASE, CERVICAL SPINE, W/RADICULOPATHY 07/21/2008   Memory loss 01/09/2008   Depressive disorder, not elsewhere classified 07/16/2007   ANEMIA-NOS 01/14/2007   HTN (hypertension) 01/14/2007   DEGENERATIVE DISC DISEASE, CERVICAL SPINE 01/14/2007   LOW BACK PAIN 01/14/2007   HLD (hyperlipidemia) 10/25/2006   Migraine headache 10/25/2006   Insomnia, unspecified 10/25/2006   ALCOHOL ABUSE, HX OF 10/25/2006   DIVERTICULITIS, HX OF 10/25/2006   ANXIETY 09/19/2006   GERD 09/19/2006   PCP:  Patient, No Pcp Per Pharmacy:   [facility] - [place], [place] - [address] [place] [address] Phone: [phone] Fax: [phone]     Social Drivers of Health (SDOH) Social History: SDOH Screenings   Food Insecurity: No Food Insecurity (08/18/2023)  Housing: Low Risk  (08/18/2023)  Transportation Needs: No Transportation Needs (08/18/2023)  Utilities: Not At Risk (08/18/2023)  Depression (PHQ2-9): Low Risk  (09/19/2022)  Social Connections: Unknown (08/18/2023)  Tobacco Use: High Risk (08/18/2023)   SDOH Interventions:     Readmission Risk Interventions    09/12/2022    9:11 AM  Readmission Risk Prevention Plan  Post Dischage Appt Complete  Medication Screening Complete  Transportation Screening Complete

## 2023-08-19 NOTE — Progress Notes (Signed)
 HD#0 SUBJECTIVE:  Patient Summary: [name] is a 78 y.o. with past medical history of pancreatic cancer on hospice care who presented to the emergency department after a fall and found to have multiple right ankle fractures.    Overnight Events:  POD1 ORIF   Interim History: Today, [name] was evaluated at the bedside following a notification from the nursing staff about unusual behavior. She was observed getting out of bed, appearing confused and agitated, and was mumbling incoherent words.  OBJECTIVE:  Vital Signs: Vitals:   08/18/23 1911 08/18/23 2055 08/19/23 0440 08/19/23 0743  BP: 115/65 119/75 111/62 118/66  Pulse: 90 99 88 90  Resp: 16 18 18 16   Temp: 98.3 F (36.8 C) 97.8 F (36.6 C) 98.9 F (37.2 C) 98.8 F (37.1 C)  TempSrc: Oral Oral Oral Oral  SpO2: 94% 97% 97% 96%  Weight:      Height:       Supplemental O2: Room Air SpO2: 96 % O2 Flow Rate (L/min): 3 L/min  Filed Weights   08/18/23 0541  Weight: 90.7 kg     Intake/Output Summary (Last 24 hours) at 08/19/2023 1315 Last data filed at 08/19/2023 0500 Gross per 24 hour  Intake 404.07 ml  Output --  Net 404.07 ml   Net IO Since Admission: 404.07 mL [08/19/23 1315]  Physical Exam: Constitutional:Confused  Cardio:Regular rate and rhythm.  Pulm: Normal work of breathing on room air. FDX:Wzhjupcz for extremity edema. Skin:Warm and dry. Neuro:Confused  Psych:Pleasant mood and affect.   Patient Lines/Drains/Airways Status     Active Line/Drains/Airways     Name Placement date Placement time Site Days   Peripheral IV 09/25/22 Left;Posterior;Proximal Forearm 09/25/22  1944  Forearm  328   Peripheral IV 08/18/23 20 G Anterior;Proximal;Right Forearm 08/18/23  1721  Forearm  1   Negative Pressure Wound Therapy Hip Left 09/10/17  --  --  2169   GI Stent 09/27/22  0820  --  326   GI Stent 09/27/22  0824  --  326   Wound 08/18/23 1708 Surgical Closed Surgical Incision Ankle Right 08/18/23  1708  Ankle   1             ASSESSMENT/PLAN:  Assessment: Principal Problem:   Open right ankle fracture   Plan: #Post- operative Delirium  The patient presented for ORIF on 6/22 after being found down. On admission, she was alert and oriented to person, place, and time. There were no immediate postoperative complications. However, she was later reassessed by nursing staff due to the emergence of unusual behaviors. At that time, she appeared confused and agitated, and was oriented only to person and place.Notably, her daughter reported a history of postoperative delirium following a pancreatic biopsy and stent placement in July 2024, though the specific anesthetic used during that procedure is unknown. According to her daughter, the previous episode of altered mental status was milder than what the patient is currently experiencing.Given the patient's disorientation, confusion, impaired attention, and decreased awareness, postoperative delirium is suspected.  I have low suspicion for infections at this time.  If patient continue to worsen and show signs of systemic infection, will get blood cultures and initiate antibiotics.Will get a UA to further assess. WBC of 8.2 <<10.7,Afebrile and Blood glucose of 153.K of 2.7. May consider imaging her head if she worsens . - Avoid anticholinergics and benzodiazepines - If patient is severely agitated and safety is ever concerning consider low-dose antipsychotics - Delirium precautions - UA  -  Replete K - AM CBC ,BMP  #Open right ankle Fracture  POD1 ORIF.  Right ankle is dry clean and intact stressed.  She is unable to say if she is in pain or not due to her delirium.  Will however continue IV pain meds and hold her oral oxycodone  as I worry about aspiration at this time. - Continuing IV Dilaudid  0.5 Q4   Best Practice: Diet: NPO  VTE: enoxaparin  (LOVENOX ) injection 40 mg Start: 08/18/23 2200 Code: DNR Family Contact: [name], at bedside. DISPO:  Anticipated discharge tomorrow to Hospice pending continued medical work up .  Signature: Drue Grow , MD Internal Medicine Resident, PGY-1 [name]  Pager: [phone] 1:15 PM, 08/19/2023   Please contact the on call pager after 5 pm and on weekends at [phone].

## 2023-08-19 NOTE — Progress Notes (Signed)
 6/23 MOON Letter signed by patient's daughter on behalf of patient. Letter explained and acknowledged.

## 2023-08-19 NOTE — Progress Notes (Signed)
 Orthopaedic Trauma Service Progress Note  Patient ID: [name] MRN: [id number] DOB/AGE: [birth year] 78 y.o.  Subjective:  Somnolent but looks comfortable  Caretaker from hospice at bedside very attentive   On hospice for pancreatic cancer   Did receive a total of 3 doses of IV ancef  for open fracture   ROS As above Today's  total administered Morphine Milligram Equivalents: 40 Yesterday's total administered Morphine Milligram Equivalents: 50  Objective:   VITALS:   Vitals:   08/18/23 1911 08/18/23 2055 08/19/23 0440 08/19/23 0743  BP: 115/65 119/75 111/62 118/66  Pulse: 90 99 88 90  Resp: 16 18 18 16   Temp: 98.3 F (36.8 C) 97.8 F (36.6 C) 98.9 F (37.2 C) 98.8 F (37.1 C)  TempSrc: Oral Oral Oral Oral  SpO2: 94% 97% 97% 96%  Weight:      Height:        Estimated body mass index is 32.28 kg/m as calculated from the following:   Height as of this encounter: 5' 6 (1.676 m).   Weight as of this encounter: 90.7 kg.   Intake/Output      06/22 0701 06/23 0700 06/23 0701 06/24 0700   P.O. 250    IV Piggyback 154.1    Total Intake(mL/kg) 404.1 (4.5)    Net +404.1         Urine Occurrence 2 x      LABS  Results for orders placed or performed during the hospital encounter of 08/18/23 (from the past 24 hours)  Basic metabolic panel     Status: Abnormal   Collection Time: 08/19/23  6:32 AM  Result Value Ref Range   Sodium 140 135 - 145 mmol/L   Potassium 2.7 (LL) 3.5 - 5.1 mmol/L   Chloride 99 98 - 111 mmol/L   CO2 26 22 - 32 mmol/L   Glucose, Bld 153 (H) 70 - 99 mg/dL   BUN 6 (L) 8 - 23 mg/dL   Creatinine, Ser 9.28 0.44 - 1.00 mg/dL   Calcium 8.2 (L) 8.9 - 10.3 mg/dL   GFR, Estimated >39 >39 mL/min   Anion gap 15 5 - 15  CBC     Status: Abnormal   Collection Time: 08/19/23  6:32 AM  Result Value Ref Range   WBC 8.2 4.0 - 10.5 K/uL   RBC 3.74 (L) 3.87 - 5.11 MIL/uL    Hemoglobin 11.2 (L) 12.0 - 15.0 g/dL   HCT 66.6 (L) 63.9 - 53.9 %   MCV 89.0 80.0 - 100.0 fL   MCH 29.9 26.0 - 34.0 pg   MCHC 33.6 30.0 - 36.0 g/dL   RDW 86.4 88.4 - 84.4 %   Platelets 239 150 - 400 K/uL   nRBC 0.0 0.0 - 0.2 %  Magnesium      Status: Abnormal   Collection Time: 08/19/23  8:20 AM  Result Value Ref Range   Magnesium  1.6 (L) 1.7 - 2.4 mg/dL     PHYSICAL EXAM:   Gen: somnolent Ext:       Right Lower Extremity Dressing and splint are stable  Extremity is warm  No DCT  No pain out of proportion with passive stretching of his toes or ankle  Did not get a good motor or sensory exam   + DP pulse  Assessment/Plan: 1 Day  Post-Op   Principal Problem:   Open right ankle fracture Active Problems:   Fall   Hypokalemia   Closed fracture of right ankle   Anti-infectives (From admission, onward)    Start     Dose/Rate Route Frequency Ordered Stop   08/18/23 2200  ceFAZolin  (ANCEF ) IVPB 2g/100 mL premix        2 g 200 mL/hr over 30 Minutes Intravenous Every 8 hours 08/18/23 1921 08/19/23 1354   08/18/23 0915  ceFAZolin  (ANCEF ) IVPB 2g/100 mL premix        2 g 200 mL/hr over 30 Minutes Intravenous STAT 08/18/23 0913 08/18/23 1021     .  POD/HD#: 1  78 y/o female hospice patient s/p fall with open R ankle fracture   - fall  -open R bimalleolar ankle fracture s/p ORIF   NwB x 6 weeks  Splint x 2 then begin ROM   Ice and elevate   Therapy    Follow up with ortho in 2 weeks   No dressing changes until outpatient follow up  - Dispo:  Ortho issues stable  Follow up in 2 weeks     Francis MICAEL Mt, PA-C [phone] (C) 08/19/2023, 2:29 PM  [facility] [address] [place] [address] [phone] [phone] (F)    After 5pm and on the weekends please log on to Amion, go to orthopaedics and the look under the [facility] Call for the provider(s) on call. You can also call our office at [phone] and then  follow the prompts to be connected to the call team.  Patient ID: [name], female   DOB: 1945-12-15, 78 y.o.   MRN: [id number]

## 2023-08-20 ENCOUNTER — Encounter (HOSPITAL_COMMUNITY): Payer: Self-pay | Admitting: Orthopedic Surgery

## 2023-08-20 DIAGNOSIS — Z515 Encounter for palliative care: Secondary | ICD-10-CM | POA: Diagnosis not present

## 2023-08-20 DIAGNOSIS — S82891A Other fracture of right lower leg, initial encounter for closed fracture: Secondary | ICD-10-CM | POA: Diagnosis not present

## 2023-08-20 DIAGNOSIS — R410 Disorientation, unspecified: Secondary | ICD-10-CM

## 2023-08-20 DIAGNOSIS — S82891B Other fracture of right lower leg, initial encounter for open fracture type I or II: Secondary | ICD-10-CM | POA: Diagnosis not present

## 2023-08-20 DIAGNOSIS — C250 Malignant neoplasm of head of pancreas: Secondary | ICD-10-CM | POA: Diagnosis not present

## 2023-08-20 LAB — URINALYSIS, ROUTINE W REFLEX MICROSCOPIC
Bilirubin Urine: NEGATIVE
Glucose, UA: NEGATIVE mg/dL
Hgb urine dipstick: NEGATIVE
Ketones, ur: 20 mg/dL — AB
Leukocytes,Ua: NEGATIVE
Nitrite: NEGATIVE
Protein, ur: NEGATIVE mg/dL
Specific Gravity, Urine: 1.011 (ref 1.005–1.030)
pH: 8 (ref 5.0–8.0)

## 2023-08-20 LAB — BASIC METABOLIC PANEL WITH GFR
Anion gap: 10 (ref 5–15)
Anion gap: 15 (ref 5–15)
BUN: 6 mg/dL — ABNORMAL LOW (ref 8–23)
BUN: 7 mg/dL — ABNORMAL LOW (ref 8–23)
CO2: 26 mmol/L (ref 22–32)
CO2: 23 mmol/L (ref 22–32)
Calcium: 8.4 mg/dL — ABNORMAL LOW (ref 8.9–10.3)
Calcium: 8.9 mg/dL (ref 8.9–10.3)
Chloride: 100 mmol/L (ref 98–111)
Chloride: 100 mmol/L (ref 98–111)
Creatinine, Ser: 0.72 mg/dL (ref 0.44–1.00)
Creatinine, Ser: 0.68 mg/dL (ref 0.44–1.00)
GFR, Estimated: >60 mL/min (ref >60)
GFR, Estimated: >60 mL/min (ref >60)
Glucose, Bld: 128 mg/dL — ABNORMAL HIGH (ref 70–99)
Glucose, Bld: 128 mg/dL — ABNORMAL HIGH (ref 70–99)
Potassium: 3 mmol/L — ABNORMAL LOW (ref 3.5–5.1)
Potassium: 3.6 mmol/L (ref 3.5–5.1)
Sodium: 136 mmol/L (ref 135–145)
Sodium: 138 mmol/L (ref 135–145)

## 2023-08-20 LAB — CBC
HCT: 38.8 % (ref 36.0–46.0)
Hemoglobin: 13 g/dL (ref 12.0–15.0)
MCH: 29.9 pg (ref 26.0–34.0)
MCHC: 33.5 g/dL (ref 30.0–36.0)
MCV: 89.2 fL (ref 80.0–100.0)
Platelets: 287 10*3/uL (ref 150–400)
RBC: 4.35 MIL/uL (ref 3.87–5.11)
RDW: 13.7 % (ref 11.5–15.5)
WBC: 11 10*3/uL — ABNORMAL HIGH (ref 4.0–10.5)
nRBC: 0 % (ref 0.0–0.2)

## 2023-08-20 LAB — MAGNESIUM: Magnesium: 2.5 mg/dL — ABNORMAL HIGH (ref 1.7–2.4)

## 2023-08-20 MED ORDER — HYDROMORPHONE HCL 1 MG/ML IJ SOLN
0.5000 mg | INTRAMUSCULAR | Status: DC
  Administered 2023-08-20 – 2023-08-21 (×7): 0.5 mg via INTRAVENOUS
  Filled 2023-08-20 (×7): qty 0.5

## 2023-08-20 MED ORDER — LORAZEPAM 2 MG/ML IJ SOLN: 1.0000 mg | INTRAMUSCULAR | Status: DC | PRN

## 2023-08-20 MED ORDER — ACETAMINOPHEN 10 MG/ML IV SOLN: 1000.0000 mg | Freq: Three times a day (TID) | INTRAVENOUS | Status: DC

## 2023-08-20 MED ORDER — ACETAMINOPHEN 10 MG/ML IV SOLN
1000.0000 mg | Freq: Three times a day (TID) | INTRAVENOUS | Status: AC
  Administered 2023-08-20 (×2): 1000 mg via INTRAVENOUS
  Filled 2023-08-20 (×3): qty 100

## 2023-08-20 MED ORDER — POTASSIUM CHLORIDE 10 MEQ/100ML IV SOLN
10.0000 meq | INTRAVENOUS | Status: AC
  Administered 2023-08-20 (×4): 10 meq via INTRAVENOUS
  Filled 2023-08-20 (×4): qty 100

## 2023-08-20 MED ORDER — KETOROLAC TROMETHAMINE 15 MG/ML IJ SOLN
15.0000 mg | Freq: Four times a day (QID) | INTRAMUSCULAR | Status: DC
  Administered 2023-08-20 – 2023-08-21 (×5): 15 mg via INTRAVENOUS
  Filled 2023-08-20 (×5): qty 1

## 2023-08-20 MED ORDER — OLANZAPINE 5 MG PO TBDP
5.0000 mg | ORAL_TABLET | Freq: Every day | ORAL | Status: DC
  Administered 2023-08-20: 5 mg via ORAL
  Filled 2023-08-20 (×3): qty 1

## 2023-08-20 NOTE — Progress Notes (Addendum)
 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 [facility] received a call from facility med tech reporting they would be sending the patient to the hospital due to a fall and patient complaining of right ankle pain. Facility reported the patient was bleeding at the ankle and is complaining of pain 10/10. Patient sent to [facility]. Per Dr. Norleen Laurence with [facility] this is a related hospital admission.   Visited patient at bedside. She was sleeping comfortably with the room dark and sitter present. Spoke with daughter by phone who was on her way to the hospital. Daughter advised that last night was very difficult for patient. She is hopeful for more discussion with hospital staff today to get more details on her mom's current condition. All questions answered.   [name] remains inpatient appropriate due to need for continued monitoring for effectiveness of interventions for surgical intervention of right ankle and post-op delirium.    VS: 98.4/98/17    132/82    spO2 100% on RA   I/O: 411.6/750    Abnormal Labs:  08/19/23  Potassium: 2.6 (LL) Chloride: 97 (L) Glucose: 132 (H) BUN: 6 (L) Calcium: 8.6 (L) Phosphorus: 1.4 (L) Albumin: 2.9 (L)  08/19/23 13:39 Urinalysis, Routine w reflex microscopic: Rpt ! Appearance: CLEAR Bilirubin Urine: NEGATIVE Color, Urine: YELLOW Glucose, UA: NEGATIVE Hgb urine dipstick: NEGATIVE Ketones, ur: 20 ! Leukocytes,Ua: NEGATIVE Nitrite: NEGATIVE pH: 8.0 Protein: NEGATIVE Specific Gravity, Urine: 1.011   Diagnostics: none new   IV/PRN meds: Dilaudid  0.5mg  IV x2, Trazadone 50mg  PO x1, potassium chloride 10 mEq IV, magnesium  sulfate 4g IV x3, Ancef  2g IV x2, Acetaminophen  1,000mg  IV x1, Toradol  15mg  IV x1     Assessment as per Renne Homans,  MD 6.24.25: Assessment: Principal Problem:   Open right ankle fracture Active Problems:   Fall   Hypokalemia   Closed fracture of right ankle     Plan: #Post- operative Delirium  The patient presented for ORIF on 6/22 after being found down. On admission, she was alert and oriented to person, place, and time. There were no immediate postoperative complications. However, she was later reassessed by nursing staff due to the emergence of unusual behaviors. At that time, she appeared confused and agitated, and was oriented only to person and place.Notably, her daughter reported a history of postoperative delirium following a pancreatic biopsy and stent placement in July 2024, though the specific anesthetic used during that procedure is unknown. According to her daughter, the previous episode of altered mental status was milder than what the patient is currently experiencing.Given the patient's disorientation, confusion, impaired attention, and decreased awareness, postoperative delirium is suspected.  I have low suspicion for infections at this time.  If patient continue to worsen and show signs of systemic infection, will get blood cultures and initiate antibiotics.   6/24- Her follow-up UA did not show any signs of urinary infection.  White count is increased from 8.2-11 likely reactive.  She remains afebrile  and seems to have come down a bit today.  Still suspect this is postop delirium and will eventually get better over the next few days.Very low suspicion for stroke at this time even thought its not a possibility.    - Will continue to avoid avoid anticholinergics and benzodiazepines - Delirium precautions   #Open right ankle  Fracture  POD2 ORIF.  Right ankle is dry clean and intact stressed.  She is unable to say if she is in pain or not due to her delirium.  Will however continue IV pain meds and hold her oral oxycodone  as I worry about aspiration at this time.Will add IV tylenol  and Toradol   for pain control . -  Hold all centrally acting medications -  Non weight bearing on the right ankle for 6 weeks   - Splint x 2 then begin ROM  -  No dressing changes until outpatient follow up in 2 weeks    #Hypokalemia  K of 3.0 from 2.6. Replete. Check PM BMP   Discharge Planning:  Ongoing, likely back to facility once medically optimized   Family contact: Spoke with daughter by phone  IDT: updated   Goals of Care: DNR   Should patient need ambulance transport at discharge, please call [facility] as we contract with them for our active hospice patients.    Please call with any hospice related questions or concerns.   Eleanor Nail, LPN [facility] Liaison [phone]

## 2023-08-20 NOTE — Plan of Care (Signed)
  Problem: Education: Goal: Knowledge of General Education information will improve Description: Including pain rating scale, medication(s)/side effects and non-pharmacologic comfort measures Outcome: Progressing   Problem: Clinical Measurements: Goal: Diagnostic test results will improve Outcome: Progressing   Problem: Pain Managment: Goal: General experience of comfort will improve and/or be controlled Outcome: Progressing

## 2023-08-20 NOTE — Progress Notes (Signed)
 HD#2 SUBJECTIVE:  Patient Summary: [name] is a 78 y.o. with past medical history of pancreatic cancer on hospice care who presented to the emergency department after a fall and found to have multiple right ankle fractures,got ORIF on 2/22. Her hospital course complicated by postop delirium.  Overnight Events:  Patient got agitated overnight but was redirectable and required no antipsychotic.  Interim History: Today, patient continued to remain delirious on my evaluation at bedside today.  She is unable to answer questions appropriately.  She responds  ok  to most questions.  She was unsure where she was and did not know her name. OBJECTIVE:  Vital Signs: Vitals:   08/19/23 1626 08/19/23 1921 08/20/23 0400 08/20/23 0803  BP: 126/66 126/70 130/79 132/82  Pulse: 93 91 89 98  Resp: 16  19 17   Temp: 98.3 F (36.8 C) 97.6 F (36.4 C) 98.2 F (36.8 C) 98.4 F (36.9 C)  TempSrc: Oral Oral Oral Oral  SpO2: 98% 100% 100% 100%  Weight:      Height:       Supplemental O2: Room Air SpO2: 100 % O2 Flow Rate (L/min): 3 L/min  Filed Weights   08/18/23 0541  Weight: 90.7 kg     Intake/Output Summary (Last 24 hours) at 08/20/2023 1204 Last data filed at 08/20/2023 1050 Gross per 24 hour  Intake 411.6 ml  Output 1050 ml  Net -638.4 ml   Net IO Since Admission: -234.33 mL [08/20/23 1204]  Physical Exam: Constitutional:Confused  Cardio:Regular rate and rhythm.  Pulm: Normal work of breathing on room air. FDX:Wzhjupcz for extremity edema. Skin:Warm and dry. Neuro:Confused  Psych:Pleasant mood and affect.   Patient Lines/Drains/Airways Status     Active Line/Drains/Airways     Name Placement date Placement time Site Days   Peripheral IV 09/25/22 Left;Posterior;Proximal Forearm 09/25/22  1944  Forearm  328   Peripheral IV 08/18/23 20 G Anterior;Proximal;Right Forearm 08/18/23  1721  Forearm  1   Negative Pressure Wound Therapy Hip Left 09/10/17  --  --  2169   GI Stent  09/27/22  0820  --  326   GI Stent 09/27/22  0824  --  326   Wound 08/18/23 1708 Surgical Closed Surgical Incision Ankle Right 08/18/23  1708  Ankle  1             ASSESSMENT/PLAN:  Assessment: Principal Problem:   Open right ankle fracture Active Problems:   Fall   Hypokalemia   Closed fracture of right ankle   Plan: #Post- operative Delirium  The patient presented for ORIF on 6/22 after being found down. On admission, she was alert and oriented to person, place, and time. There were no immediate postoperative complications. However, she was later reassessed by nursing staff due to the emergence of unusual behaviors. At that time, she appeared confused and agitated, and was oriented only to person and place.Notably, her daughter reported a history of postoperative delirium following a pancreatic biopsy and stent placement in July 2024, though the specific anesthetic used during that procedure is unknown. According to her daughter, the previous episode of altered mental status was milder than what the patient is currently experiencing.Given the patient's disorientation, confusion, impaired attention, and decreased awareness, postoperative delirium is suspected.  I have low suspicion for infections at this time.  If patient continue to worsen and show signs of systemic infection, will get blood cultures and initiate antibiotics.  6/24- Her follow-up UA did not show any signs of  urinary infection.  White count is increased from 8.2-11 likely reactive.  She remains afebrile  and seems to have come down a bit today.  Still suspect this is postop delirium and will eventually get better over the next few days.Very low suspicion for stroke at this time even thought its not a possibility.   - Will continue to avoid avoid anticholinergics and benzodiazepines - Delirium precautions  #Open right ankle Fracture  POD2 ORIF.  Right ankle is dry clean and intact stressed.  She is unable to say if she  is in pain or not due to her delirium.  Will however continue IV pain meds and hold her oral oxycodone  as I worry about aspiration at this time.Will add IV tylenol  and Toradol  for pain control . -  Hold all centrally acting medications -  Non weight bearing on the right ankle for 6 weeks   - Splint x 2 then begin ROM  -  No dressing changes until outpatient follow up in 2 weeks   #Hypokalemia  K of 3.0 from 2.6. Replete. Check PM BMP   Best Practice: Diet: NPO  VTE: enoxaparin  (LOVENOX ) injection 40 mg Start: 08/18/23 2200 Code: DNR Family Contact: [name], to be notified.  Signature: Drue Grow , MD Internal Medicine Resident, PGY-1 [name]  Pager: [phone] 12:04 PM, 08/20/2023   Please contact the on call pager after 5 pm and on weekends at [phone].

## 2023-08-20 NOTE — Progress Notes (Signed)
 Patient appears to be agitated. Sitter in the room. Notified Norman Lobstein, MD

## 2023-08-20 NOTE — Consult Note (Signed)
 Consultation Note Date: 08/20/2023   Patient Name: [name]  DOB: [birth year]  MRN: [id number]  Age / Sex: 78 y.o., female  PCP: Patient, No Pcp Per Referring Physician: Lovie Clarity, MD  Reason for Consultation:  GOC clarification  HPI/Patient Profile: 78 y.o. female  with past medical history of pancreatic cancer s/p common bile duct stent- elected no treatment and has been on hospice since September 2024,  admitted on 08/18/2023 with fall resulting in an open ankle fracture. She underwent ORIF on 08/18/2023. Mental status has recovered poorly since surgery. Palliative consulted for further GOC discussion.    Primary Decision Maker NEXT OF KIN - patient's daughter [name]  Discussion: Chart reviewed including labs, progress notes, imaging from this and previous encounters.  Discussed case with attending provider and with hospice liaison. BMET today with no significant findings. CBC with mildly elevated WBC at 11.  Met with daughter outside of room. On eval patient is somnolent. She responds to questions but doesn't open her eyes. She says help me intermittently. Unable to state her name.  Laurie was visibly upset- shared her concern that her mother is dying, is suffering with pain, and also not maintaining her dignity.  Wealthy has had ongoing decline and changes in her mental status over the last several months. She was frequently confused and was beginning to have difficulty with her balance.  We discussed that her current state could be a result of something happening during surgery (ie stroke), could be a temporary delirium from her surgery (which she has had in the past), and could also be a worsening of pre-surgery cognitive state that is a new baseline.  I recommended not pursuing further workup as this would not likely change the eventual outcome and would likely cause more stress to [name].  Laurie agrees with this.  Laurie is concerned that [name] is withdrawing as she was previously on around the clock opioids.  [name]'s primary goal at this point is for her Mom to not be in pain, not agitated, not afraid, and to maintain her dignity.  We discussed restarting scheduled opioids with the understanding this may worsen her mental status. Laurie is in agreement with this.  Will plan to re-evaluate [facility] tomorrow and possibly transition to full comfort measures only, and seek either inpatient hospice or return to [facility] with hospice.    SUMMARY OF RECOMMENDATIONS -Start hydromorphone  0.5 IV q 4hrs based on 24hr OME that she was taking prior to admission -lorazepam 1mg  IV q4 hr prn for agitation, sleep -Zyprexa 5mg  po at bedtime for delirium and sleep -Continue other interventions for now- will re-evaluate tomorrow   Code Status/Advance Care Planning:   Code Status: Limited: Do not attempt resuscitation (DNR) -DNR-LIMITED -Do Not Intubate/DNI     Prognosis:   Unable to determine  Discharge Planning: To Be Determined  Primary Diagnoses: Present on Admission:  Open right ankle fracture   Review of Systems  Unable to perform ROS: Mental status change    Physical Exam Vitals  and nursing note reviewed.  Constitutional:      General: She is in acute distress.     Appearance: She is ill-appearing.   Neurological:     Mental Status: She is disoriented.     Comments: Somnolent    Vital Signs: BP (!) 107/53   Pulse 92   Temp 97.6 F (36.4 C) (Axillary)   Resp 17   Ht 5' 6 (1.676 m)   Wt 90.7 kg   SpO2 99%   BMI 32.28 kg/m  Pain Scale: 0-10   Pain Score: 0-No pain   SpO2: SpO2: 99 % O2 Device:SpO2: 99 % O2 Flow Rate: .O2 Flow Rate (L/min): 3 L/min  IO: Intake/output summary:  Intake/Output Summary (Last 24 hours) at 08/20/2023 1605 Last data filed at 08/20/2023 1552 Gross per 24 hour  Intake 412.6 ml  Output 1550 ml  Net -1137.4 ml    LBM: Last  BM Date : 08/18/23 Baseline Weight: Weight: 90.7 kg Most recent weight: Weight: 90.7 kg       Thank you for this consult. Palliative medicine will continue to follow and assist as needed.  Time Total:  90 minutes Signed by: Cassondra Stain, AGNP-C Palliative Medicine  Time includes:   Preparing to see the patient (e.g., review of tests) Obtaining and/or reviewing separately obtained history Performing a medically necessary appropriate examination and/or evaluation Counseling and educating the patient/family/caregiver Ordering medications, tests, or procedures Referring and communicating with other health care professionals (when not reported separately) Documenting clinical information in the electronic or other health record Independently interpreting results (not reported separately) and communicating results to the patient/family/caregiver Care coordination (not reported separately) Clinical documentation   Please contact Palliative Medicine Team phone at [phone] for questions and concerns.  For individual provider: See Tracey

## 2023-08-20 NOTE — Progress Notes (Signed)
 Clarified to [employer] DO about the potassium order and replied to secure chat to continue with the order as per [facility]. Order carried out.

## 2023-08-20 NOTE — Progress Notes (Signed)
 Physical Therapy Treatment Patient Details Name: [name] MRN: [id number] DOB: 04-04-1945 Today's Date: 08/20/2023   History of Present Illness [name] is a 78 yo female brought to [facility] 6/22 who had an unwitnessed fall out of bed. Found to have multiple R ankle fractures. S/p ORIF 6/22. PMHx: end stage pancreatic cancer, on hospice, anxiety, HLD, HTN, memory loss.    PT Comments  A little more alert today. Opening eyes to voice, following simple commands inconsistently. Able to rise to EOB with min assist. Too restless and unable to comply with NWB on RLE, attempting to stand spontaneously multiple times requiring RLE support for safety, therefore deferred transfer training. Multimodal cues throughout. Frequently re-orienting. Bowel incontinence, assisted with peri-care linen change. Sitter in room.    If plan is discharge home, recommend the following: Two people to help with walking and/or transfers;Two people to help with bathing/dressing/bathroom;Assist for transportation;Supervision due to cognitive status;Direct supervision/assist for medications management;Direct supervision/assist for financial management;Assistance with cooking/housework   Can travel by private vehicle     No  Equipment Recommendations   (TBD next venue)    Recommendations for Other Services       Precautions / Restrictions Precautions Precautions: Fall Recall of Precautions/Restrictions: Impaired Required Braces or Orthoses: Splint/Cast Splint/Cast: long leg splint applied in OR Splint/Cast - Date Prophylactic Dressing Applied (if applicable): 08/18/23 Restrictions Weight Bearing Restrictions Per Provider Order: Yes RLE Weight Bearing Per Provider Order: Non weight bearing     Mobility  Bed Mobility Overal bed mobility: Needs Assistance Bed Mobility: Sit to Supine, Sidelying to Sit Rolling: Min assist, Used rails Sidelying to sit: Min assist   Sit to supine: Min assist   General bed  mobility comments: Min assist to facilitate reach and hold of rail. Multimodal cues throughout. min assist for trunk and RLE support to edge of bed. Impulsive to attempt to stand and unable to follow commands for RLE NWB therefore required min assist to block at EOB. Min assist for RLE into bed but able to scoot and slide up in bed once supine with cues.    Transfers                   General transfer comment: defer - impulsive, noncompliant with WB due to confusion.    Ambulation/Gait                   Stairs             Wheelchair Mobility     Tilt Bed    Modified Rankin (Stroke Patients Only)       Balance Overall balance assessment: Needs assistance Sitting-balance support: No upper extremity supported, Feet unsupported Sitting balance-Leahy Scale: Fair                                      Hotel manager: Impaired  Cognition Arousal: Lethargic Behavior During Therapy: Anxious, Impulsive, Restless   PT - Cognitive impairments: No family/caregiver present to determine baseline, Difficult to assess Difficult to assess due to: Level of arousal                     PT - Cognition Comments: Opening eyes with cues today Following commands: Impaired Following commands impaired: Follows one step commands inconsistently    Cueing Cueing Techniques: Verbal cues, Gestural cues, Tactile cues, Visual cues  Exercises General  Exercises - Lower Extremity Long Arc Quad: AAROM, Both, 5 reps, Seated    General Comments General comments (skin integrity, edema, etc.): Incontinent of stool. assisted with peri-care.      Pertinent Vitals/Pain Pain Assessment Pain Assessment: PAINAD Breathing: normal Negative Vocalization: occasional moan/groan, low speech, negative/disapproving quality Facial Expression: smiling or inexpressive Body Language: tense, distressed pacing, fidgeting Consolability: distracted or  reassured by voice/touch PAINAD Score: 3 Pain Intervention(s): Limited activity within patient's tolerance, Monitored during session, Repositioned    Home Living                          Prior Function            PT Goals (current goals can now be found in the care plan section) Acute Rehab PT Goals Patient Stated Goal: none stated PT Goal Formulation: Patient unable to participate in goal setting Time For Goal Achievement: 09/02/23 Potential to Achieve Goals: Fair Progress towards PT goals: Progressing toward goals    Frequency    Min 2X/week      PT Plan      Co-evaluation              AM-PAC PT 6 Clicks Mobility   Outcome Measure  Help needed turning from your back to your side while in a flat bed without using bedrails?: A Little Help needed moving from lying on your back to sitting on the side of a flat bed without using bedrails?: A Little Help needed moving to and from a bed to a chair (including a wheelchair)?: Total Help needed standing up from a chair using your arms (e.g., wheelchair or bedside chair)?: Total Help needed to walk in hospital room?: Total Help needed climbing 3-5 steps with a railing? : Total 6 Click Score: 10    End of Session Equipment Utilized During Treatment: Gait belt Activity Tolerance: Other (comment) (Limited by confusion/restlessness) Patient left: in bed;with call bell/phone within reach;with bed alarm set;with nursing/sitter in room   PT Visit Diagnosis: Other abnormalities of gait and mobility (R26.89);Muscle weakness (generalized) (M62.81);History of falling (Z91.81);Difficulty in walking, not elsewhere classified (R26.2);Other symptoms and signs involving the nervous system (R29.898);Pain Pain - Right/Left: Right Pain - part of body: Ankle and joints of foot     Time: 8946-8886 PT Time Calculation (min) (ACUTE ONLY): 20 min  Charges:    $Therapeutic Activity: 8-22 mins PT General Charges $$ ACUTE PT  VISIT: 1 Visit                     Leontine Roads, PT, DPT [facility]  Rehabilitation Services Physical Therapist Office: [phone] Website: [email or web address]    Leontine GORMAN Roads 08/20/2023, 12:46 PM

## 2023-08-21 DIAGNOSIS — C250 Malignant neoplasm of head of pancreas: Secondary | ICD-10-CM

## 2023-08-21 DIAGNOSIS — E876 Hypokalemia: Secondary | ICD-10-CM

## 2023-08-21 DIAGNOSIS — Z7189 Other specified counseling: Secondary | ICD-10-CM

## 2023-08-21 DIAGNOSIS — R410 Disorientation, unspecified: Secondary | ICD-10-CM

## 2023-08-21 DIAGNOSIS — Z515 Encounter for palliative care: Secondary | ICD-10-CM

## 2023-08-21 DIAGNOSIS — W19XXXA Unspecified fall, initial encounter: Secondary | ICD-10-CM

## 2023-08-21 DIAGNOSIS — S82891B Other fracture of right lower leg, initial encounter for open fracture type I or II: Secondary | ICD-10-CM | POA: Diagnosis not present

## 2023-08-21 DIAGNOSIS — S82891A Other fracture of right lower leg, initial encounter for closed fracture: Secondary | ICD-10-CM | POA: Diagnosis not present

## 2023-08-21 MED ORDER — GLYCOPYRROLATE 1 MG PO TABS: 1.0000 mg | ORAL_TABLET | ORAL | Status: DC | PRN

## 2023-08-21 MED ORDER — KETOROLAC TROMETHAMINE 15 MG/ML IJ SOLN: 15.0000 mg | Freq: Four times a day (QID) | INTRAMUSCULAR | Status: DC

## 2023-08-21 MED ORDER — GERHARDT'S BUTT CREAM
TOPICAL_CREAM | Freq: Every day | CUTANEOUS | Status: DC
  Filled 2023-08-21: qty 60

## 2023-08-21 MED ORDER — ONDANSETRON 4 MG PO TBDP: 4.0000 mg | ORAL_TABLET | Freq: Four times a day (QID) | ORAL | Status: DC | PRN

## 2023-08-21 MED ORDER — HYDROMORPHONE HCL 1 MG/ML IJ SOLN: 0.5000 mg | INTRAMUSCULAR | Status: DC | PRN

## 2023-08-21 MED ORDER — GLYCOPYRROLATE 0.2 MG/ML IJ SOLN: 0.2000 mg | INTRAMUSCULAR | Status: DC | PRN

## 2023-08-21 MED ORDER — HALOPERIDOL LACTATE 5 MG/ML IJ SOLN: 0.5000 mg | INTRAMUSCULAR | Status: DC | PRN

## 2023-08-21 MED ORDER — POLYVINYL ALCOHOL 1.4 % OP SOLN: 1.0000 [drp] | Freq: Four times a day (QID) | OPHTHALMIC | Status: DC | PRN

## 2023-08-21 MED ORDER — LORAZEPAM 2 MG/ML PO CONC: 1.0000 mg | ORAL | Status: DC | PRN

## 2023-08-21 MED ORDER — LORAZEPAM 1 MG PO TABS: 1.0000 mg | ORAL_TABLET | ORAL | Status: DC | PRN

## 2023-08-21 MED ORDER — LORAZEPAM 2 MG/ML IJ SOLN: 1.0000 mg | INTRAMUSCULAR | Status: DC | PRN

## 2023-08-21 MED ORDER — HALOPERIDOL 0.5 MG PO TABS: 0.5000 mg | ORAL_TABLET | ORAL | Status: DC | PRN

## 2023-08-21 MED ORDER — HALOPERIDOL LACTATE 2 MG/ML PO CONC: 0.5000 mg | ORAL | Status: DC | PRN

## 2023-08-21 MED ORDER — ONDANSETRON HCL 4 MG/2ML IJ SOLN: 4.0000 mg | Freq: Four times a day (QID) | INTRAMUSCULAR | Status: DC | PRN

## 2023-08-21 MED ORDER — ACETAMINOPHEN 650 MG RE SUPP: 650.0000 mg | Freq: Four times a day (QID) | RECTAL | Status: DC | PRN

## 2023-08-21 MED ORDER — ACETAMINOPHEN 325 MG PO TABS: 650.0000 mg | ORAL_TABLET | Freq: Four times a day (QID) | ORAL | Status: DC | PRN

## 2023-08-21 MED ORDER — GERHARDT'S BUTT CREAM
TOPICAL_CREAM | Freq: Three times a day (TID) | CUTANEOUS | Status: DC
  Filled 2023-08-21: qty 60

## 2023-08-21 MED ORDER — HYDROMORPHONE HCL 1 MG/ML IJ SOLN: 0.5000 mg | INTRAMUSCULAR | Status: DC

## 2023-08-21 NOTE — TOC Progression Note (Signed)
 Transition of Care (TOC) - Progression Note   NCM received secure chat from palliative team , daughter requesting [facility] referral. Palliative made [facility] referral.   Melissa with [facility] offered a bed, daughter accepted. Transport set up with [facility] .   Asked MD to sign DNR form  Patient Details  Name: [name] MRN: [id number] Date of Birth: 03-16-1945  Transition of Care [facility]) CM/SW Contact  Aijalon Kirtz, Powell Jansky, RN Phone Number: 08/21/2023, 12:35 PM  Clinical Narrative:       Expected Discharge Plan: Memory Care Barriers to Discharge: Continued Medical Work up  Expected Discharge Plan and Services In-house Referral: Clinical Social Work   Post Acute Care Choice: Resumption of Svcs/PTA Provider, Hospice Living arrangements for the past 2 months: Assisted Living Facility Expected Discharge Date: 08/21/23                                     Social Determinants of Health (SDOH) Interventions SDOH Screenings   Food Insecurity: No Food Insecurity (08/18/2023)  Housing: Low Risk  (08/18/2023)  Transportation Needs: No Transportation Needs (08/18/2023)  Utilities: Not At Risk (08/18/2023)  Depression (PHQ2-9): Low Risk  (09/19/2022)  Social Connections: Unknown (08/18/2023)  Tobacco Use: High Risk (08/18/2023)    Readmission Risk Interventions    09/12/2022    9:11 AM  Readmission Risk Prevention Plan  Post Dischage Appt Complete  Medication Screening Complete  Transportation Screening Complete

## 2023-08-21 NOTE — Progress Notes (Addendum)
 Called [facility] at [phone] report given to [place], [place]. Provided direct number for any additional questions.Per Cassie, RN RFA PIV can stay in place.

## 2023-08-21 NOTE — Progress Notes (Signed)
 Notified by unit secretary, Madelin Comings, that pt's DNR form was in the chart. Per Charlott, RN, at [facility] the facility will get another DNR form completed.

## 2023-08-21 NOTE — Progress Notes (Deleted)
 HD#2 SUBJECTIVE:  Patient Summary: [name] is a 78 y.o. with past medical history of pancreatic cancer on hospice care who presented to the emergency department after a fall and found to have multiple right ankle fractures,got ORIF on 2/22. Her hospital course complicated by postop delirium.There is ongoing discussion regarding transitioning to comfort care.  Overnight Events:  none   Interim History: Today, [name] remains confused and is unable to respond to questions appropriately. When asked if she was in pain or if she knew her location, she simply responded with ok.  OBJECTIVE:  Vital Signs: Vitals:   08/20/23 0803 08/20/23 1548 08/20/23 2035 08/21/23 0457  BP: 132/82 (!) 107/53 135/79 139/83  Pulse: 98 92 80 88  Resp: 17 17 17 16   Temp: 98.4 F (36.9 C) 97.6 F (36.4 C) 98.5 F (36.9 C) (!) 97.5 F (36.4 C)  TempSrc: Axillary Axillary Oral Axillary  SpO2: 100% 99% 98% 95%  Weight:      Height:       Supplemental O2: Room Air SpO2: 95 % O2 Flow Rate (L/min): 3 L/min  Filed Weights   08/18/23 0541  Weight: 90.7 kg     Intake/Output Summary (Last 24 hours) at 08/21/2023 0653 Last data filed at 08/21/2023 0321 Gross per 24 hour  Intake 201 ml  Output 1050 ml  Net -849 ml   Net IO Since Admission: -783.33 mL [08/21/23 0653]  Physical Exam: Constitutional:Confused  Cardio:Regular rate and rhythm.  Pulm: Normal work of breathing on room air. FDX:Wzhjupcz for extremity edema. Skin:Warm and dry. Neuro:Confused  Psych:Pleasant mood and affect.   Patient Lines/Drains/Airways Status     Active Line/Drains/Airways     Name Placement date Placement time Site Days   Peripheral IV 09/25/22 Left;Posterior;Proximal Forearm 09/25/22  1944  Forearm  328   Peripheral IV 08/18/23 20 G Anterior;Proximal;Right Forearm 08/18/23  1721  Forearm  1   Negative Pressure Wound Therapy Hip Left 09/10/17  --  --  2169   GI Stent 09/27/22  0820  --  326   GI Stent  09/27/22  0824  --  326   Wound 08/18/23 1708 Surgical Closed Surgical Incision Ankle Right 08/18/23  1708  Ankle  1             ASSESSMENT/PLAN:  Assessment: Principal Problem:   Open right ankle fracture Active Problems:   Fall   Hypokalemia   Closed fracture of right ankle   Acute delirium   Plan: #Post- operative Delirium   The patient presented for ORIF on 6/22 after being found down. On admission, she was alert and oriented to person, place, and time. There were no immediate postoperative complications. However, she was later reassessed by nursing staff due to the emergence of unusual behaviors. At that time, she appeared confused and agitated, and was oriented only to person and place.Notably, her daughter reported a history of postoperative delirium following a pancreatic biopsy and stent placement in July 2024, though the specific anesthetic used during that procedure is unknown. According to her daughter, the previous episode of altered mental status was milder than what the patient is currently experiencing.Given the patient's disorientation, confusion, impaired attention, and decreased awareness, postoperative delirium is suspected.  I have low suspicion for infections at this time.  If patient continue to worsen and show signs of systemic infection, will get blood cultures and initiate antibiotics.  6/24- Her follow-up UA did not show any signs of urinary infection.  White  count is increased from 8.2-11 likely reactive.  She remains afebrile  and seems to have come down a bit today.  Still suspect this is postop delirium and will eventually get better over the next few days.Very low suspicion for stroke at this time even thought its not a possibility.   6/25 : The patient remains delirious with no significant change from yesterday. Yesterday, her daughter and the palliative care team discussed the possibility of transitioning to comfort care. Her previously withheld opioids  were resumed to ensure she remains comfortable and pain-free. I expect that she will be transitioned to comfort care today.  - Appreciate Palliative  - F/U on comfort care discussion   #Open right ankle Fracture  POD 2 following ORIF of the right ankle. The right ankle remains clean, dry, and intact upon inspection. Due to her delirium, she is unable to reliably report pain. Will continue pain control  - IV dilaudid   - Toradol     #Hypokalemia - Resolved    Best Practice: Diet: NPO  VTE: enoxaparin  (LOVENOX ) injection 40 mg Start: 08/18/23 2200 Code: DNR Family Contact: [name], called and notified.  Signature: Drue Grow , MD Internal Medicine Resident, PGY-1 [name]  Pager: [phone] 6:53 AM, 08/21/2023   Please contact the on call pager after 5 pm and on weekends at [phone].

## 2023-08-21 NOTE — Plan of Care (Signed)
  Problem: Education: Goal: Knowledge of General Education information will improve Description: Including pain rating scale, medication(s)/side effects and non-pharmacologic comfort measures Outcome: Adequate for Discharge   Problem: Health Behavior/Discharge Planning: Goal: Ability to manage health-related needs will improve Outcome: Adequate for Discharge   Problem: Clinical Measurements: Goal: Ability to maintain clinical measurements within normal limits will improve Outcome: Adequate for Discharge Goal: Will remain free from infection Outcome: Adequate for Discharge Goal: Diagnostic test results will improve Outcome: Adequate for Discharge Goal: Respiratory complications will improve Outcome: Adequate for Discharge Goal: Cardiovascular complication will be avoided Outcome: Adequate for Discharge   Problem: Activity: Goal: Risk for activity intolerance will decrease Outcome: Adequate for Discharge   Problem: Nutrition: Goal: Adequate nutrition will be maintained Outcome: Adequate for Discharge   Problem: Coping: Goal: Level of anxiety will decrease Outcome: Adequate for Discharge   Problem: Elimination: Goal: Will not experience complications related to bowel motility Outcome: Adequate for Discharge Goal: Will not experience complications related to urinary retention Outcome: Adequate for Discharge   Problem: Pain Managment: Goal: General experience of comfort will improve and/or be controlled Outcome: Adequate for Discharge   Problem: Safety: Goal: Ability to remain free from injury will improve Outcome: Adequate for Discharge   Problem: Skin Integrity: Goal: Risk for impaired skin integrity will decrease Outcome: Adequate for Discharge   Problem: Education: Goal: Knowledge of the prescribed therapeutic regimen will improve Outcome: Adequate for Discharge   Problem: Coping: Goal: Ability to identify and develop effective coping behavior will  improve Outcome: Adequate for Discharge   Problem: Clinical Measurements: Goal: Quality of life will improve Outcome: Adequate for Discharge   Problem: Respiratory: Goal: Verbalizations of increased ease of respirations will increase Outcome: Adequate for Discharge   Problem: Role Relationship: Goal: Family's ability to cope with current situation will improve Outcome: Adequate for Discharge Goal: Ability to verbalize concerns, feelings, and thoughts to partner or family member will improve Outcome: Adequate for Discharge   Problem: Pain Management: Goal: Satisfaction with pain management regimen will improve Outcome: Adequate for Discharge

## 2023-08-21 NOTE — Plan of Care (Signed)
   Problem: Coping: Goal: Level of anxiety will decrease Outcome: Progressing   Problem: Pain Managment: Goal: General experience of comfort will improve and/or be controlled Outcome: Progressing

## 2023-08-21 NOTE — Progress Notes (Signed)
 Occupational Therapy Discharge Patient Details Name: [name] MRN: [id number] DOB: Jul 13, 1945 Today's Date: 08/21/2023   Patient discharged from OT services secondary to pt being transferred to hospice- comfort care.  Please see latest therapy progress note for current level of functioning and progress toward goals.         Leita Howell, OTR/L,CBIS  Supplemental OT - [facility] and [facility] Secure Chat Preferred   08/21/2023, 1:40 PM

## 2023-08-21 NOTE — Progress Notes (Signed)
 Daily Progress Note   Patient Name: [name]       Date: 08/21/2023 DOB: 1945/10/23  Age: 78 y.o. MRN#: [id number] Attending Physician: Eben Reyes BROCKS, MD Primary Care Physician: Patient, No Pcp Per Admit Date: 08/18/2023  Reason for Consultation/Follow-up: Establishing goals of care  Patient Profile/HPI: 78 y.o. female  with past medical history of pancreatic cancer s/p common bile duct stent- elected no treatment and has been on hospice since September 2024,  admitted on 08/18/2023 with fall resulting in an open ankle fracture. She underwent ORIF on 08/18/2023. Mental status has recovered poorly since surgery. Palliative consulted for further GOC discussion.    Subjective: Chart reviewed including labs, progress notes, imaging from this and previous encounters.  Discussed with attending team and with hospice liaison.  Patient appears much more comfortable than when I saw her  yesterday.  Met with daughter [name] outside of room. Last night patient was able to open eyes and wake up a little- able to recognize family. We discussed plan going forward. Patient continues not to eat or drink. [name] notes that prior to admission she had really stopped eating.  Option for full comfort measures was discussed. Discussed transition to comfort measures only which includes stopping IV fluids, antibiotics, labs and providing symptom management for SOB, anxiety, nausea, vomiting, and other symptoms of dying.  [name] agrees with full comfort. Would like to seek placement at [facility].  [name] inquired about prognosis- I shared that I would be surprised if patient was still here with us  in days-weeks. [name] agreed and noted she had felt that way also. Emotional support provided.   Review of Systems   Unable to perform ROS: Mental status change     Physical Exam Vitals and nursing note reviewed.  Constitutional:      General: She is not in acute distress.  Cardiovascular:     Rate and Rhythm: Normal rate.     Pulses: Normal pulses.  Pulmonary:     Effort: Pulmonary effort is normal.   Neurological:     Comments: sleeping            Vital Signs: BP 133/78   Pulse 93   Temp 97.7 F (36.5 C) (Axillary)   Resp 16   Ht 5' 6 (1.676 m)   Wt 90.7 kg  SpO2 93%   BMI 32.28 kg/m  SpO2: SpO2: 93 % O2 Device: O2 Device: Room Air O2 Flow Rate: O2 Flow Rate (L/min): 3 L/min  Intake/output summary:  Intake/Output Summary (Last 24 hours) at 08/21/2023 1031 Last data filed at 08/21/2023 0321 Gross per 24 hour  Intake 201 ml  Output 750 ml  Net -549 ml   LBM: Last BM Date : 08/20/23 Baseline Weight: Weight: 90.7 kg Most recent weight: Weight: 90.7 kg       Palliative Assessment/Data: PPS: 10%      Patient Active Problem List   Diagnosis Date Noted   Acute delirium 08/20/2023   Fall 08/19/2023   Hypokalemia 08/19/2023   Closed fracture of right ankle 08/19/2023   Open right ankle fracture 08/18/2023   Genetic testing 10/22/2022   Malignant neoplasm of head of pancreas (HCC) 09/26/2022   Biliary obstruction 09/25/2022   Cancer of head of pancreas (HCC) 09/19/2022   Angioedema 09/09/2022   Anxiety and depression 09/09/2022   Elevated LFTs 09/09/2022   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) 05/19/2020   Neuropathy 05/19/2020   Status post total hip replacement, left 09/10/2017   Chest pain 11/29/2015   Primary osteoarthritis of right hip 08/04/2015   Preventative health care 01/15/2011   WEIGHT LOSS 06/09/2009   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   CERVICAL RADICULOPATHY, LEFT 10/12/2008   DIZZINESS 09/29/2008   DEGENERATIVE DISC DISEASE, CERVICAL SPINE, W/RADICULOPATHY 07/21/2008   Memory loss 01/09/2008   Depressive disorder, not elsewhere classified  07/16/2007   ANEMIA-NOS 01/14/2007   HTN (hypertension) 01/14/2007   DEGENERATIVE DISC DISEASE, CERVICAL SPINE 01/14/2007   LOW BACK PAIN 01/14/2007   HLD (hyperlipidemia) 10/25/2006   Migraine headache 10/25/2006   Insomnia, unspecified 10/25/2006   ALCOHOL ABUSE, HX OF 10/25/2006   DIVERTICULITIS, HX OF 10/25/2006   ANXIETY 09/19/2006   GERD 09/19/2006    Palliative Care Assessment & Plan    Assessment/Recommendations/Plan  SUMMARY OF RECOMMENDATIONS -Continue hydromorphone  0.5 IV q 4hrs based on 24hr OME that she was taking prior to admission -Continue Zyprexa 5mg  po at bedtime for delirium and sleep -Transition to full comfort measures only- comfort medication orders entered   Code Status:   Code Status: Limited: Do not attempt resuscitation (DNR) -DNR-LIMITED -Do Not Intubate/DNI    Prognosis:  < 2 weeks  Discharge Planning: Hospice facility pending evaluation and bed availability  Care plan was discussed with attending MD, hospice liaison, RN, family  Thank you for allowing the Palliative Medicine Team to assist in the care of this patient.  Total time:  65 mins Prolonged billing:  Time includes:   Preparing to see the patient (e.g., review of tests) Obtaining and/or reviewing separately obtained history Performing a medically necessary appropriate examination and/or evaluation Counseling and educating the patient/family/caregiver Ordering medications, tests, or procedures Referring and communicating with other health care professionals (when not reported separately) Documenting clinical information in the electronic or other health record Independently interpreting results (not reported separately) and communicating results to the patient/family/caregiver Care coordination (not reported separately) Clinical documentation  Cassondra Stain, AGNP-C Palliative Medicine   Please contact Palliative Medicine Team phone at [phone] for questions and concerns.

## 2023-08-21 NOTE — Progress Notes (Signed)
 Pt spit out crushed oral meds in apple sauce. Attempted to administer twice. Notified Dr. Drue Grow.

## 2023-08-21 NOTE — Progress Notes (Signed)
 [name] to be D/C'd  per MD order.  Discussed with the patient and all questions fully answered.  VSS, Skin clean, dry and intact without evidence of skin break down, no evidence of skin tears noted.  IV catheter discontinued intact. Site without signs and symptoms of complications. Dressing and pressure applied.  An After Visit Summary was printed and given to [facility].  RFA PIV left in place per [facility] request. Report given to Cassie. [name], pt's daughter notified of d'c.   Pt transported by [facility] to [facility].

## 2023-08-21 NOTE — Discharge Summary (Addendum)
 Name: [name] MRN: [id number] DOB: 1945/04/01 78 y.o. PCP: Patient, No Pcp Per  Date of Admission: 08/18/2023  5:35 AM Date of Discharge: 08/21/2023 12:06 PM Attending Physician: Dr. Eben   Discharge Diagnosis: Principal Problem:   Open right ankle fracture Active Problems:   Fall   Hypokalemia   Closed fracture of right ankle   Acute delirium    Discharge Medications: Allergies as of 08/21/2023       Reactions   Codeine Itching   Atorvastatin Other (See Comments)   Unknown; not listed on MAR   Lisinopril  Other (See Comments)   Angioedema   Aspirin  Other (See Comments)   Reaction: stomach ache   Morphine Other (See Comments)   severe headache   Nsaids Other (See Comments)   STOMACH PAINS        Medication List     STOP taking these medications    acetaminophen  500 MG tablet Commonly known as: TYLENOL    ALPRAZolam  0.25 MG tablet Commonly known as: XANAX    amLODipine  10 MG tablet Commonly known as: NORVASC    bismuth  subsalicylate 262 MG/15ML suspension Commonly known as: PEPTO BISMOL   buPROPion  150 MG 24 hr tablet Commonly known as: WELLBUTRIN  XL   busPIRone  5 MG tablet Commonly known as: BUSPAR    dicyclomine  20 MG tablet Commonly known as: BENTYL    DULoxetine 30 MG capsule Commonly known as: CYMBALTA   Eszopiclone 3 MG Tabs   furosemide 20 MG tablet Commonly known as: LASIX   Geri-Tussin 100 MG/5ML liquid Generic drug: guaiFENesin    hydrochlorothiazide  12.5 MG capsule Commonly known as: MICROZIDE    hydrOXYzine  25 MG tablet Commonly known as: ATARAX    loratadine  10 MG tablet Commonly known as: CLARITIN    omeprazole 20 MG capsule Commonly known as: PRILOSEC   ondansetron  4 MG tablet Commonly known as: ZOFRAN    pregabalin  100 MG capsule Commonly known as: LYRICA    sertraline  50 MG tablet Commonly known as: ZOLOFT    Systane Balance 0.6 % Soln Generic drug: Propylene Glycol       TAKE these medications     HYDROmorphone  1 MG/ML injection Commonly known as: DILAUDID  Inject 0.5 mLs (0.5 mg total) into the vein every 4 (four) hours.   ketorolac  15 MG/ML injection Commonly known as: TORADOL  Inject 1 mL (15 mg total) into the vein every 6 (six) hours.   oxyCODONE  HCl 10 MG Taba Take 1 tablet by mouth every 4 (four) hours as needed (PAIN).   oxyCODONE  5 MG immediate release tablet Commonly known as: Oxy IR/ROXICODONE  Take 5 mg by mouth every 4 (four) hours as needed.   Oxycodone  HCl 10 MG Tabs Take 10 mg by mouth 3 (three) times daily.   senna 8.6 MG Tabs tablet Commonly known as: SENOKOT Take 1 tablet by mouth daily.   traZODone  50 MG tablet Commonly known as: DESYREL  Take 50 mg by mouth at bedtime.          TRANSITIONING TO FULL COMFORT CARE MEASURES AT [facility]   Disposition and follow-up:   LaurieLaurie [name] was discharged from [facility].  Follow-up:  Ensure full comfort care measures are in place   2.  Labs / imaging needed at time of follow-up: N/A  3.  Pending labs/ test needing follow-up: N/A  4.  Medication Changes: N/A    Hospital Course #Post- operative Delirium   The patient underwent ORIF on 6/22 after being found down. At the time of admission, she was alert  and oriented to person, place, and time. The immediate postoperative course was unremarkable. However, she was subsequently reassessed by nursing staff due to the onset of unusual behavior. At that time, she appeared confused and agitated, and was only oriented to person and place.  Her daughter reported a prior episode of postoperative delirium following a pancreatic biopsy and stent placement in July 2024, though the anesthetic used during that procedure is unknown. According to her daughter, the previous episode was less severe than her current symptoms.  In light of her confusion, disorientation, impaired attention, and decreased awareness, postoperative delirium was  suspected. On POD 2, the patient's daughter requested a palliative care consultation to revisit goals of care. After multiple discussions, the decision was made to transition the patient to full comfort measures with planned placement at [facility]. Transportation has been arranged for transfer today.    Discharge Subjective: Today, [name] remains confused and is unable to respond to questions appropriately. When asked if she was in pain or if she knew her location, she simply responded with ok.   Discharge Exam:   Blood pressure 133/78, pulse 93, temperature 97.7 F (36.5 C), temperature source Axillary, resp. rate 16, height 5' 6 (1.676 m), weight 90.7 kg, SpO2 93%.  Constitutional:Ill-appearing woman, laying in bed ,in no acute distress Neurological:Confused  Skin: warm and dry Psych: Normal mood and affect  Pertinent Labs, Studies, and Procedures:     Latest Ref Rng & Units 08/20/2023    6:25 AM 08/19/2023    6:32 AM 08/18/2023    6:37 AM  CBC  WBC 4.0 - 10.5 K/uL 11.0  8.2  10.7   Hemoglobin 12.0 - 15.0 g/dL 86.9  88.7  87.2   Hematocrit 36.0 - 46.0 % 38.8  33.3  37.8   Platelets 150 - 400 K/uL 287  239  273        Latest Ref Rng & Units 08/20/2023    2:59 PM 08/20/2023    6:25 AM 08/19/2023    3:36 PM  CMP  Glucose 70 - 99 mg/dL 871  871  867   BUN 8 - 23 mg/dL 7  6  6    Creatinine 0.44 - 1.00 mg/dL 9.31  9.27  9.33   Sodium 135 - 145 mmol/L 138  136  139   Potassium 3.5 - 5.1 mmol/L 3.6  3.0  2.6   Chloride 98 - 111 mmol/L 100  100  97   CO2 22 - 32 mmol/L 23  26  27    Calcium 8.9 - 10.3 mg/dL 8.9  8.4  8.6     DG Ankle Complete Right Result Date: 08/18/2023 CLINICAL DATA:  Right ankle fracture EXAM: RIGHT ANKLE - COMPLETE 3+ VIEW COMPARISON:  Intraoperative examination 08/18/2023 FINDINGS: Three view radiograph the right ankle performed within an external immobilizer demonstrates bimalleolar ORIF with a lateral plate and cortical screws, 2 transsyndesmotic screws,  and 2 lag screws within the medial malleolus. Fracture fragments are in anatomic alignment on this limited examination. Ankle mortise is aligned. Subtalar joints appear aligned. No unexpected fracture or dislocation. IMPRESSION: 1. Status post bimalleolar ORIF. Fracture fragments in anatomic alignment. Electronically Signed   By: Dorethia Molt M.D.   On: 08/18/2023 19:41   DG Ankle Complete Right Result Date: 08/18/2023 CLINICAL DATA:  [id number] Surgery, elective [id number] EXAM: RIGHT ANKLE - COMPLETE 3+ VIEW COMPARISON:  None Available. FINDINGS: Intraoperative plate and screw fixation of the distal fibula and screw fixation of the medial  malleolus and syndesmosis. 4 low resolution intraoperative spot views of the right ankle were obtained. No fracture visible on the limited views. Total fluoroscopy time: 42 seconds Total radiation dose: 1.7 mGy IMPRESSION: Intraoperative plate and screw fixation of the distal fibula and screw fixation of the medial malleolus and syndesmosis. Electronically Signed   By: Morgane  Naveau M.D.   On: 08/18/2023 17:20   DG C-Arm 1-60 Min-No Report Result Date: 08/18/2023 Fluoroscopy was utilized by the requesting physician.  No radiographic interpretation.   DG C-Arm 1-60 Min-No Report Result Date: 08/18/2023 Fluoroscopy was utilized by the requesting physician.  No radiographic interpretation.   DG Ankle 2 Views Right Result Date: 08/18/2023 EXAM: 2 VIEW(S) XRAY OF THE RIGHT ANKLE 08/18/2023 06:08:14 AM CLINICAL HISTORY: Fall. COMPARISON: None available. FINDINGS: BONES AND JOINTS: Comminuted fracture dislocation and an oblique fracture are present in the distal fibula. An avulsion fracture is present in the medial malleolus. The talus is displaced laterally. A nondisplaced posterior tibial fracture is present. SOFT TISSUES: Extensive surrounding soft tissue swelling is present. IMPRESSION: 1. Comminuted fracture dislocation of the right ankle with oblique fracture of the distal  fibula, avulsion fracture of the medial malleolus, lateral displacement of the talus, and nondisplaced posterior tibial fracture. 2. Extensive surrounding soft tissue swelling. Electronically signed by: Lonni Necessary MD 08/18/2023 06:23 AM EDT RP Workstation: [id number]     Discharge Instructions: Discharge Instructions     Diet - low sodium heart healthy   Complete by: As directed    Increase activity slowly   Complete by: As directed    No wound care   Complete by: As directed        Signed: Drue GROW, MD [name] - PGY1 Pager: [phone] 08/21/2023, 12:06 PM    Please contact the on call pager after 5 pm and on weekends at [phone].

## 2023-08-21 NOTE — Consult Note (Signed)
 WOC Nurse Consult Note: Reason for Consult: buttocks skin breakdown  Wound type: Moisture Associated Skin Damage surrounding rectum extending onto buttocks  ICD-10 CM Codes for Irritant Dermatitis  L24A2 - Due to fecal, urinary or dual incontinence Pressure Injury POA: NA  Measurement: widespread  Wound bed: erythema with partial thickness skin loss around rectum and onto buttocks, appears to have satellite lesions indicating fungal component   Drainage (amount, consistency, odor)  Periwound:erythema  Dressing procedure/placement/frequency:  Cleanse around rectum and buttocks with soap and water, dry and apply Gerhardt's Butt Cream 3 times a day and prn soiling.  May sprinkle over Gerhardt's with floor stock antifungal powder (green and white label Microguard) for extra drying effect.    POC discussed with bedside nurse. Appreciate G. Georjean, RN assistance with this consult.   WOC team will not follow.  Reconsult if further needs arise.   Thank you,    Powell Bar MSN, RN-BC, [employer]

## 2023-09-27 DEATH — deceased

## 2023-10-05 NOTE — Op Note (Signed)
 08/18/2023  4:58 PM  PATIENT:  [name]  1946/01/24 female   MEDICAL RECORD NUMBER: [id number]  PRE-OPERATIVE DIAGNOSIS:  Right Open Ankle Fracture Dislocation  POST-OPERATIVE DIAGNOSIS:  1. Right Open Ankle Fracture Dislocation 2. Ruptured Syndesmosis  PROCEDURES:   OPEN REDUCTION INTERNAL FIXATION OF RIGHT BIMALLEOLAR ANKLE FRACTURE OPEN REDUCTION INTERNAL FIXATION OF THE SYNDESMOSIS DEBRIDEMENT OF OPEN FRACTURE INCLUDING BONE MANUAL APPLICATION OF STRESS ANKLE SYNDESMOSIS UNDER FLUOROSCOPY  SURGEON:  Ozell DEL. Celena, M.D.  ASSISTANT:  Francis Mt, PA-C.  ANESTHESIA:  General.  COMPLICATIONS:  None.  TOURNIQUET: None.  ESTIMATED BLOOD LOSS:  50 mL.  DISPOSITION:  To PACU.  CONDITION:  Stable.  DELAY START OF DVT PROPHYLAXIS BECAUSE OF BLEEDING RISK: NO  BRIEF SUMMARY AND INDICATIONS FOR PROCEDURE:  The patient is a 78 y.o. who sustained a fracture dislocation of the ankle, treated with simple closure of the wound at the time of presentation to the Emergency Department, followed  by splint application. Because of her open fracture wound and potential for sepsis continued severe pain, I recommended reduction, debridement and repair as a palliative measure. I discussed with the patient's POA the risks and benefits of surgery including the  possibility of infection, DVT, PE, nerve injury, vessel injury, loss of motion, arthritis, symptomatic hardware, heart attack, stroke and need for further surgery, among others. We specifically discussed syndesmotic repair and that some of the implants used for this can require subsequent removal. After acknowledging these risks, consent was provided to proceed.  SUMMARY OF PROCEDURE:  The patient was taken to the operating room after administration of a regional block and preoperative antibiotics.  The right lower extremity was prepped and draped in the usual sterile fashion.  A timeout was held, and then I began with the open wound,  extending distal and proximal limbs vertically, which enabled washout of the ankle joint.  This also enabled me to expose the bone ends. Using the scalpel, I sharply excised injured skin, subcutaneous tissue, and muscle. I also removed contaminated free cortical bone segments devoid of soft tissue attachment and then the curettes to additionally excise contaminated cancellous bone. 3000 cc of saline with the were then flushed through the exposed bone ends and wound, with my assistant helping to deliver the bone ends and supplementing with soap wash and rinse, as well.  Once the debridement was completed, we turned our attention to fracture repair.   After application of new drapes and gloves, an incision was made directly over the lateral malleolus with careful dissection to avoid injury to the superficial peroneal nerve.  The periosteum was left intact as we continued deep dissection.  The fracture site was identified and curettage and lavage used to remove hematoma.  With the assistance of distal manipulation and placement of tenaculums, we were able to obtain an anatomic reduction with  interdigitation of the primary fracture fragments.  Because of the angle and comminution of the fracture site, I was unable to place a lag screw but was able to hold this interdigitated during application of a lateral malleolus plate.  We used the Paragon system and placed standard fixation in the shaft and distal lateral malleolus, confirming plate position with x-ray and then continuing with a standard fixation as well as a locked fixation.  Final images showed appropriate reductional replacement, trajectory, and length.  Next, attention was turned to the medial side.  Here, a curvilinear incision was extended to allow for distal placement of screws as well as direct  access to the medial malleolus fracture site and joint.  There was a large medial malleolar fragment that did extend down to the anterior plafond. I did  inspect the posterior tibialis tendon, which was clearly visible and intact.  I did not identify any large fragments of articular cartilage loss off the dome of the talus. I then placed a drill hole in the medial metaphysis of the tibia and used a pointed tenaculum to gain compression and reduction of the fracture site, which was also seen to interdigitate as a 15 blade was used to scratch back the periosteum just at the edge for 2 mm or less.  This was followed by placement of K wires and then use of the cannulated drill placing 2 partially threaded screws into the far cortex.  Excellent compression was obtained.  The C-arm was then brought back in and AP, lateral and mortise views showed restoration of ankle alignment reduction.    I then performed an external rotation stress view of the ankle under live fluoroscopy.   Instability was identified by lateral translation of the talus, widening of the syndesmotiic interval, and widening of the medial clear space. Consequently we proceeded with syndesmotic fixation. A small stab incision was made anteromedially and the pointed tenaculum used to apply pressure for reduction from head of most distal screw in the plate. Two tricortical screws were then placed from the fibula into the tibia with 15 degrees of anteversion. Wounds were irrigated once more and then closed in standard layered fashion using 2-0 Vicryl and 3-0 nylon.  A sterile gently compressive dressing was applied, and then a posterior and stirrup splint with the ankle extended just above neutral.   PROGNOSIS: The patient will be nonweightbearing in the splint with ice and elevation over the next 3 to 5 days.  We will plan to see her back in the office in 10-14 days for removal of sutures and transition to a Cam boot with unrestricted range of motion of the  ankle at that time.  Weightbearing at 6-8 weeks. She remains under palliative care and this should help her pain considerably and reduce her  chances of infection and sepsis.
